# Patient Record
Sex: Male | Born: 1955
Health system: Southern US, Community
[De-identification: ages and names within clinical notes are randomized; demographics above are authoritative.]

## PROBLEM LIST (undated history)

## (undated) DIAGNOSIS — I10 Essential (primary) hypertension: Secondary | ICD-10-CM

## (undated) DIAGNOSIS — M1990 Unspecified osteoarthritis, unspecified site: Secondary | ICD-10-CM

## (undated) DIAGNOSIS — E785 Hyperlipidemia, unspecified: Secondary | ICD-10-CM

## (undated) DIAGNOSIS — R7303 Prediabetes: Secondary | ICD-10-CM

## (undated) DIAGNOSIS — R519 Headache, unspecified: Secondary | ICD-10-CM

## (undated) DIAGNOSIS — M4802 Spinal stenosis, cervical region: Secondary | ICD-10-CM

## (undated) DIAGNOSIS — K219 Gastro-esophageal reflux disease without esophagitis: Secondary | ICD-10-CM

## (undated) DIAGNOSIS — R319 Hematuria, unspecified: Secondary | ICD-10-CM

## (undated) DIAGNOSIS — J189 Pneumonia, unspecified organism: Secondary | ICD-10-CM

## (undated) HISTORY — DX: Spinal stenosis, cervical region: M48.02

## (undated) HISTORY — DX: Hyperlipidemia, unspecified: E78.5

## (undated) HISTORY — PX: OTHER SURGICAL HISTORY: SHX169

## (undated) HISTORY — DX: Hematuria, unspecified: R31.9

## (undated) HISTORY — PX: HERNIA REPAIR: SHX51

## (undated) HISTORY — DX: Headache, unspecified: R51.9

---

## 2006-01-19 ENCOUNTER — Emergency Department (HOSPITAL_COMMUNITY): Admission: EM | Admit: 2006-01-19 | Discharge: 2006-01-19 | Payer: Self-pay | Admitting: Emergency Medicine

## 2009-01-05 ENCOUNTER — Emergency Department (HOSPITAL_COMMUNITY): Admission: EM | Admit: 2009-01-05 | Discharge: 2009-01-05 | Payer: Self-pay | Admitting: Emergency Medicine

## 2009-01-05 IMAGING — CR DG CHEST 2V
3 series · 3 of 3 positions shown · non-contrast
Comparison: None

CLINICAL DATA: Cough, shortness of breath, smoker, asthma

CHEST - 2 VIEW

[view not recorded (1 of 3)]
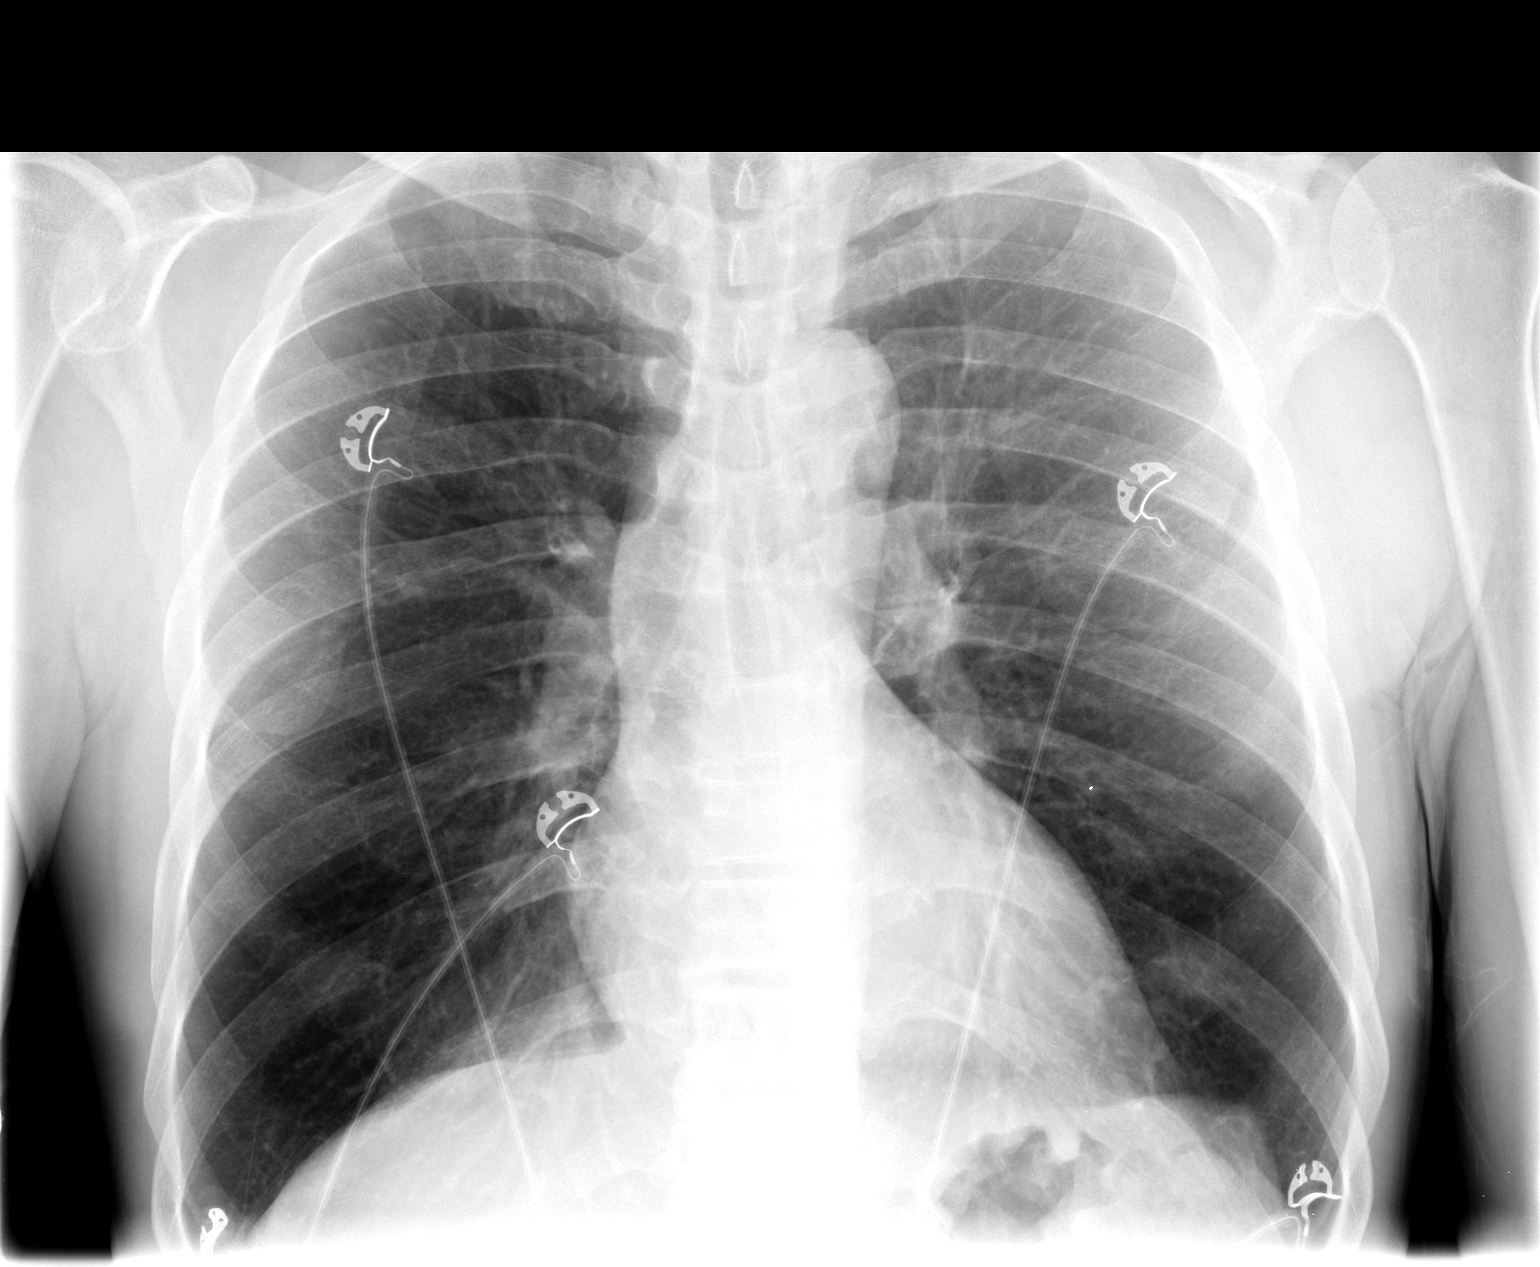

[view not recorded (2 of 3)]
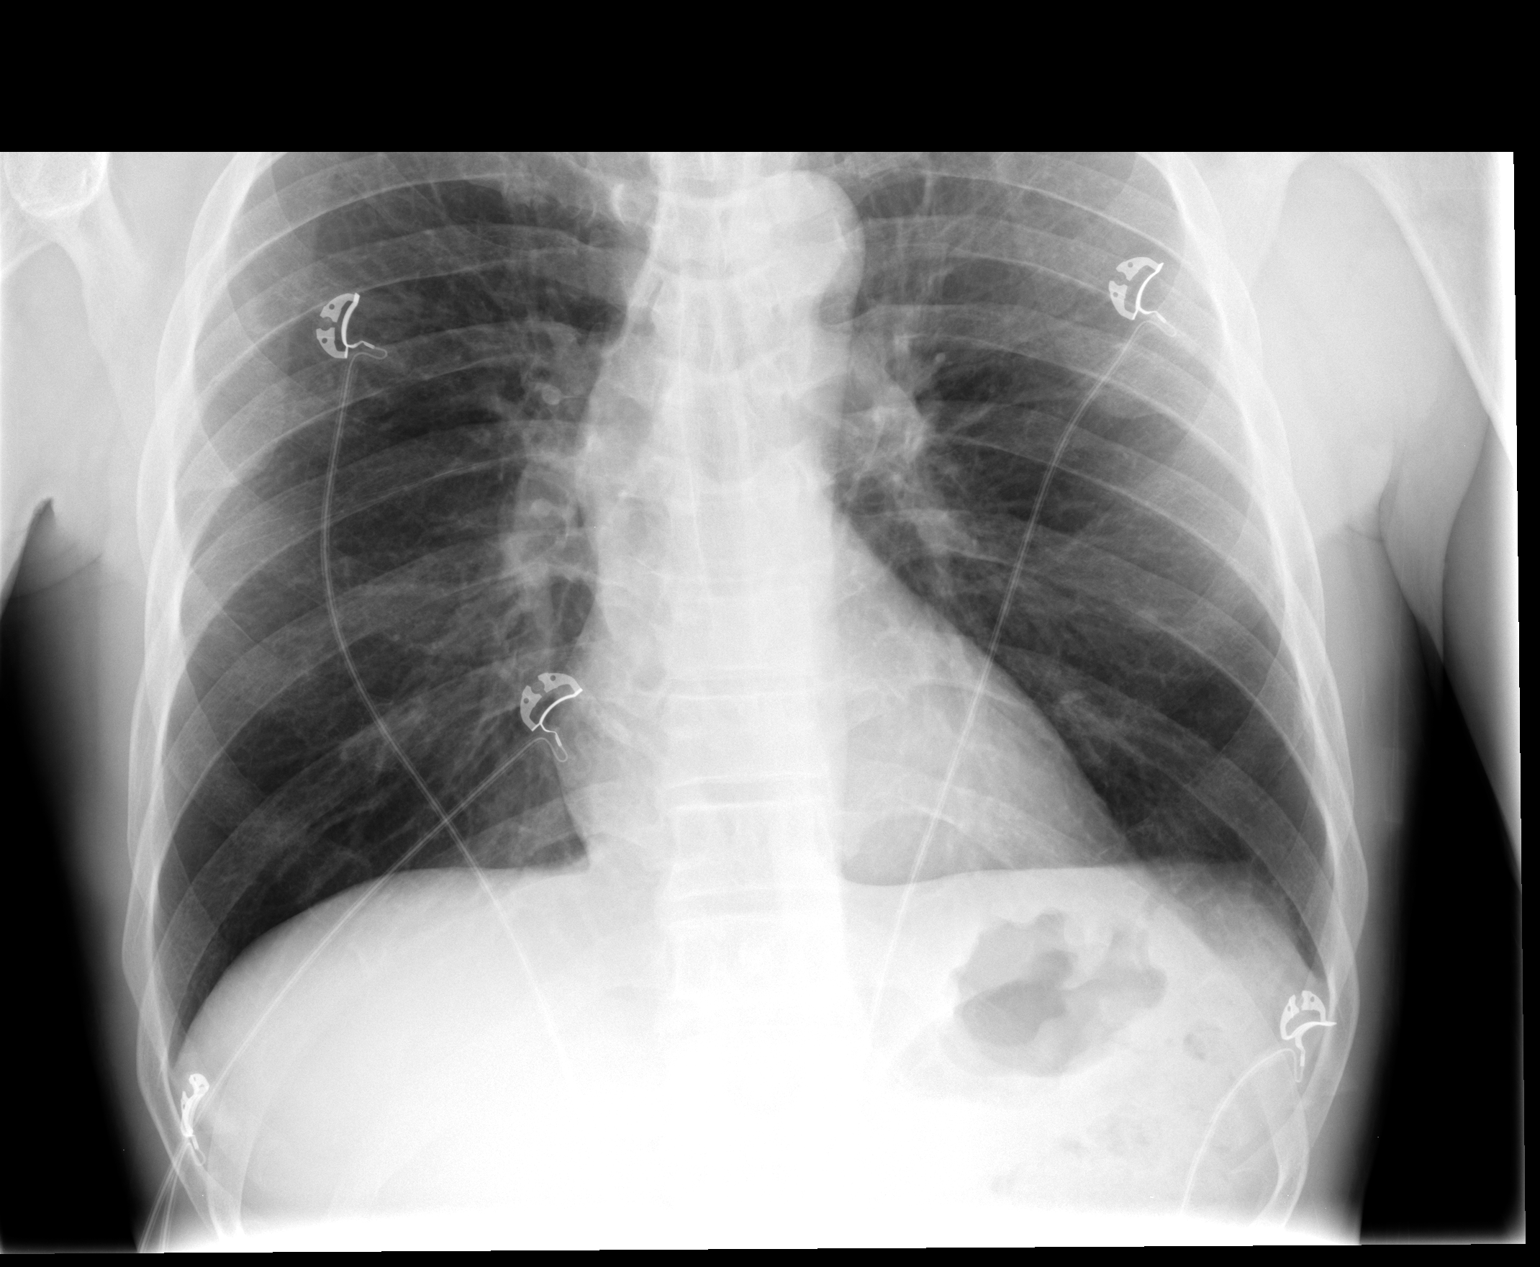

[view not recorded (3 of 3)]
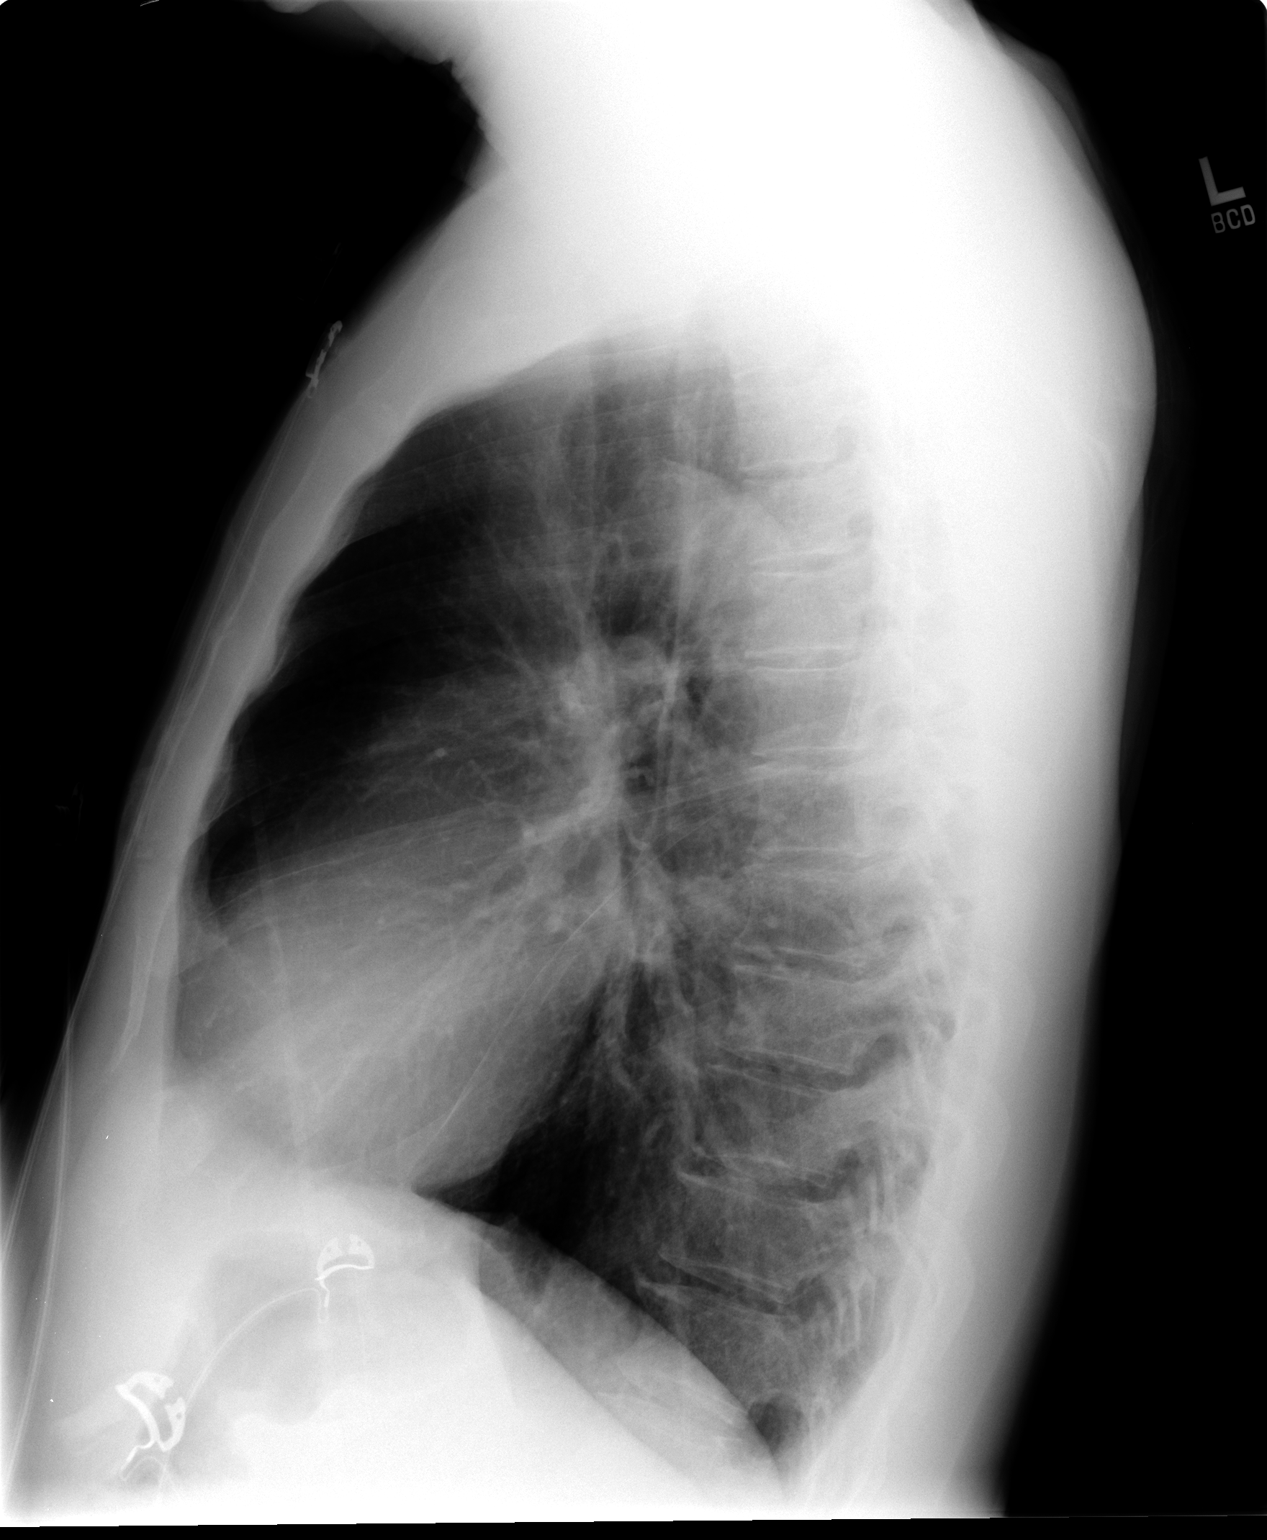

[3 of 3 positions shown; findings below may reference images not displayed]

FINDINGS: Normal heart size, mediastinal contours, and pulmonary vascularity.
Peribronchial thickening and minimal hyperexpansion question asthma
or COPD.
No pulmonary infiltrate, pleural effusion, or pneumothorax.
Cardiac monitoring lines checked over chest.
IMPRESSION: COPD versus asthma.
No acute abnormalities.

## 2010-09-04 LAB — BASIC METABOLIC PANEL
BUN: 15 mg/dL (ref 6–23)
CO2: 29 mEq/L (ref 19–32)
Calcium: 9.3 mg/dL (ref 8.4–10.5)
Chloride: 103 mEq/L (ref 96–112)
Creatinine, Ser: 1.19 mg/dL (ref 0.4–1.5)
GFR calc Af Amer: >60 mL/min (ref >60)
GFR calc non Af Amer: >60 mL/min (ref >60)
Glucose, Bld: 109 mg/dL — ABNORMAL HIGH (ref 70–99)
Potassium: 3.6 mEq/L (ref 3.5–5.1)
Sodium: 138 mEq/L (ref 135–145)

## 2010-09-04 LAB — DIFFERENTIAL
Basophils Absolute: 0 10*3/uL (ref 0.0–0.1)
Basophils Relative: 0 % (ref 0–1)
Eosinophils Absolute: 0.2 10*3/uL (ref 0.0–0.7)
Eosinophils Relative: 2 % (ref 0–5)
Lymphocytes Relative: 18 % (ref 12–46)
Lymphs Abs: 1.8 10*3/uL (ref 0.7–4.0)
Monocytes Absolute: 0.1 10*3/uL (ref 0.1–1.0)
Monocytes Relative: 1 % — ABNORMAL LOW (ref 3–12)
Neutro Abs: 7.9 10*3/uL — ABNORMAL HIGH (ref 1.7–7.7)
Neutrophils Relative %: 79 % — ABNORMAL HIGH (ref 43–77)

## 2010-09-04 LAB — CBC
HCT: 42.4 % (ref 39.0–52.0)
Hemoglobin: 14.4 g/dL (ref 13.0–17.0)
MCHC: 33.8 g/dL (ref 30.0–36.0)
MCV: 85.5 fL (ref 78.0–100.0)
Platelets: 186 10*3/uL (ref 150–400)
RBC: 4.96 MIL/uL (ref 4.22–5.81)
RDW: 13.9 % (ref 11.5–15.5)
WBC: 10 10*3/uL (ref 4.0–10.5)

## 2010-12-23 ENCOUNTER — Encounter: Payer: Self-pay | Admitting: *Deleted

## 2010-12-23 ENCOUNTER — Emergency Department (HOSPITAL_COMMUNITY)
Admission: EM | Admit: 2010-12-23 | Discharge: 2010-12-23 | Disposition: A | Discharge status: Home / Self Care | Payer: Self-pay | Attending: Emergency Medicine | Admitting: Emergency Medicine

## 2010-12-23 DIAGNOSIS — J45909 Unspecified asthma, uncomplicated: Secondary | ICD-10-CM | POA: Insufficient documentation

## 2010-12-23 MED ORDER — ALBUTEROL SULFATE HFA 108 (90 BASE) MCG/ACT IN AERS: 2.0000 | INHALATION_SPRAY | RESPIRATORY_TRACT | Status: DC | PRN

## 2010-12-23 NOTE — ED Provider Notes (Signed)
Medical screening examination/treatment/procedure(s) were performed by non-physician practitioner and as supervising physician I was immediately available for consultation/collaboration.    Joya Gaskins, MD 12/23/10 [phone]

## 2010-12-23 NOTE — ED Provider Notes (Signed)
History     Chief Complaint  Patient presents with  . Asthma   HPI Comments: Pt states he ran out of his inhaler about a week ago. He notices difficulty with breathing in the heat when he is out of the inhaler. He denies chest pain, hemoptysis, or LOC. Problem resolves with the fan blowing in his face or using his inhaler.  Patient is a 55 y.o. male presenting with asthma. The history is provided by the patient.  Asthma This is a recurrent problem. The current episode started in the past 7 days. The problem occurs daily. The problem has been unchanged. Associated symptoms include coughing. Pertinent negatives include no abdominal pain, arthralgias, chest pain, chills, fever, nausea, neck pain or rash. Exacerbated by: heat. He has tried nothing for the symptoms. The treatment provided no relief.    Past Medical History  Diagnosis Date  . Asthma     Past Surgical History  Procedure Date  . Hernia repair     No family history on file.  History  Substance Use Topics  . Smoking status: Never Smoker   . Smokeless tobacco: Not on file  . Alcohol Use: Yes     Occ      Review of Systems  Constitutional: Negative for fever, chills and activity change.       All ROS Neg except as noted in HPI  HENT: Negative for nosebleeds and neck pain.   Eyes: Negative for photophobia and discharge.  Respiratory: Positive for cough, chest tightness, shortness of breath and wheezing. Negative for apnea.   Cardiovascular: Negative for chest pain and palpitations.  Gastrointestinal: Negative for nausea, abdominal pain and blood in stool.  Genitourinary: Negative for dysuria, frequency and hematuria.  Musculoskeletal: Negative for back pain and arthralgias.  Skin: Negative.  Negative for rash.  Neurological: Negative for dizziness, seizures and speech difficulty.  Psychiatric/Behavioral: Negative for hallucinations and confusion.    Physical Exam  BP 139/78  Pulse 71  Temp(Src) 98.5 F (36.9  C) (Oral)  Resp 20  Ht 5\' 9"  (1.753 m)  Wt 180 lb (81.647 kg)  BMI 26.58 kg/m2  SpO2 95%  Physical Exam  Nursing note and vitals reviewed. Constitutional: He is oriented to person, place, and time. He appears well-developed and well-nourished.  Non-toxic appearance.  HENT:  Head: Normocephalic.  Right Ear: Tympanic membrane and external ear normal.  Left Ear: Tympanic membrane and external ear normal.  Eyes: EOM and lids are normal. Pupils are equal, round, and reactive to light.  Neck: Normal range of motion. Neck supple. Carotid bruit is not present.  Cardiovascular: Normal rate, regular rhythm, normal heart sounds, intact distal pulses and normal pulses.   Pulmonary/Chest: Breath sounds normal. No respiratory distress. He has no wheezes. He has no rales. He exhibits no tenderness.       Course breath sounds, no distress. No wheeze, no Retractions.  Abdominal: Soft. Bowel sounds are normal. There is no tenderness. There is no guarding.  Musculoskeletal: Normal range of motion.  Lymphadenopathy:       Head (right side): No submandibular adenopathy present.       Head (left side): No submandibular adenopathy present.    He has no cervical adenopathy.  Neurological: He is alert and oriented to person, place, and time. He has normal strength. No cranial nerve deficit or sensory deficit.  Skin: Skin is warm and dry. He is not diaphoretic. No pallor.  Psychiatric: He has a normal mood and affect. His  speech is normal.    ED Course  Procedures  MDM I have reviewed nursing notes, vital signs, and all appropriate lab and imaging results for this patient.      Kathie Dike, [place] 12/23/10 1021

## 2010-12-23 NOTE — ED Notes (Signed)
Pt states asthma is acting up. Out of inhaler. Cough, productive at times, white in color. No distress or audible wheezing at triage.

## 2010-12-23 NOTE — ED Notes (Signed)
Pt eval by [facility].  

## 2014-01-19 ENCOUNTER — Encounter (HOSPITAL_COMMUNITY): Payer: Self-pay | Admitting: Emergency Medicine

## 2014-01-19 ENCOUNTER — Emergency Department (HOSPITAL_COMMUNITY)
Admission: EM | Admit: 2014-01-19 | Discharge: 2014-01-19 | Disposition: A | Discharge status: Home / Self Care | Payer: Self-pay | Attending: Emergency Medicine | Admitting: Emergency Medicine

## 2014-01-19 ENCOUNTER — Emergency Department (HOSPITAL_COMMUNITY): Payer: No Typology Code available for payment source

## 2014-01-19 DIAGNOSIS — S46909A Unspecified injury of unspecified muscle, fascia and tendon at shoulder and upper arm level, unspecified arm, initial encounter: Secondary | ICD-10-CM | POA: Insufficient documentation

## 2014-01-19 DIAGNOSIS — IMO0002 Reserved for concepts with insufficient information to code with codable children: Secondary | ICD-10-CM | POA: Insufficient documentation

## 2014-01-19 DIAGNOSIS — Z23 Encounter for immunization: Secondary | ICD-10-CM | POA: Insufficient documentation

## 2014-01-19 DIAGNOSIS — J45909 Unspecified asthma, uncomplicated: Secondary | ICD-10-CM | POA: Insufficient documentation

## 2014-01-19 DIAGNOSIS — Y92410 Unspecified street and highway as the place of occurrence of the external cause: Secondary | ICD-10-CM | POA: Insufficient documentation

## 2014-01-19 DIAGNOSIS — Y9389 Activity, other specified: Secondary | ICD-10-CM | POA: Insufficient documentation

## 2014-01-19 DIAGNOSIS — S4980XA Other specified injuries of shoulder and upper arm, unspecified arm, initial encounter: Secondary | ICD-10-CM | POA: Insufficient documentation

## 2014-01-19 DIAGNOSIS — T148XXA Other injury of unspecified body region, initial encounter: Secondary | ICD-10-CM
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
## 2014-01-19 MED ORDER — TETANUS-DIPHTH-ACELL PERTUSSIS 5-2.5-18.5 LF-MCG/0.5 IM SUSP
0.5000 mL | Freq: Once | INTRAMUSCULAR | Status: AC
  Administered 2014-01-19: 0.5 mL via INTRAMUSCULAR
  Filled 2014-01-19: qty 0.5

## 2014-01-19 MED ORDER — HYDROCODONE-ACETAMINOPHEN 5-325 MG PO TABS
2.0000 | ORAL_TABLET | Freq: Once | ORAL | Status: AC
  Administered 2014-01-19: 2 via ORAL
  Filled 2014-01-19: qty 2

## 2014-01-19 MED ORDER — HYDROCODONE-ACETAMINOPHEN 5-325 MG PO TABS: 1.0000 | ORAL_TABLET | Freq: Four times a day (QID) | ORAL | Status: DC | PRN
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
## 2014-01-19 NOTE — Discharge Instructions (Signed)
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
## 2014-01-22 ENCOUNTER — Emergency Department (HOSPITAL_COMMUNITY)
Admission: EM | Admit: 2014-01-22 | Discharge: 2014-01-22 | Disposition: A | Discharge status: Home / Self Care | Payer: No Typology Code available for payment source | Attending: Emergency Medicine | Admitting: Emergency Medicine

## 2014-01-22 ENCOUNTER — Emergency Department (HOSPITAL_COMMUNITY): Payer: No Typology Code available for payment source

## 2014-01-22 ENCOUNTER — Encounter (HOSPITAL_COMMUNITY): Payer: Self-pay | Admitting: Emergency Medicine

## 2014-01-22 DIAGNOSIS — M549 Dorsalgia, unspecified: Secondary | ICD-10-CM | POA: Insufficient documentation

## 2014-01-22 DIAGNOSIS — M25559 Pain in unspecified hip: Secondary | ICD-10-CM | POA: Insufficient documentation

## 2014-01-22 DIAGNOSIS — Z791 Long term (current) use of non-steroidal anti-inflammatories (NSAID): Secondary | ICD-10-CM | POA: Insufficient documentation

## 2014-01-22 DIAGNOSIS — Y92410 Unspecified street and highway as the place of occurrence of the external cause: Secondary | ICD-10-CM | POA: Insufficient documentation

## 2014-01-22 DIAGNOSIS — S7011XA Contusion of right thigh, initial encounter: Secondary | ICD-10-CM

## 2014-01-22 DIAGNOSIS — Z4800 Encounter for change or removal of nonsurgical wound dressing: Secondary | ICD-10-CM | POA: Insufficient documentation

## 2014-01-22 DIAGNOSIS — Y9389 Activity, other specified: Secondary | ICD-10-CM | POA: Insufficient documentation

## 2014-01-22 DIAGNOSIS — J45909 Unspecified asthma, uncomplicated: Secondary | ICD-10-CM | POA: Insufficient documentation

## 2014-01-22 DIAGNOSIS — S7010XA Contusion of unspecified thigh, initial encounter: Secondary | ICD-10-CM | POA: Insufficient documentation

## 2014-01-22 DIAGNOSIS — Z5189 Encounter for other specified aftercare: Secondary | ICD-10-CM

## 2014-01-22 DIAGNOSIS — S7001XA Contusion of right hip, initial encounter: Secondary | ICD-10-CM

## 2014-01-22 IMAGING — CR DG HIP COMPLETE 2+V*R*
3 series · 3 of 3 positions shown · non-contrast
Comparison: None.

CLINICAL DATA: Right hip pain status post trauma

EXAM:
RIGHT HIP - COMPLETE 2+ VIEW

[view not recorded (1 of 3)]
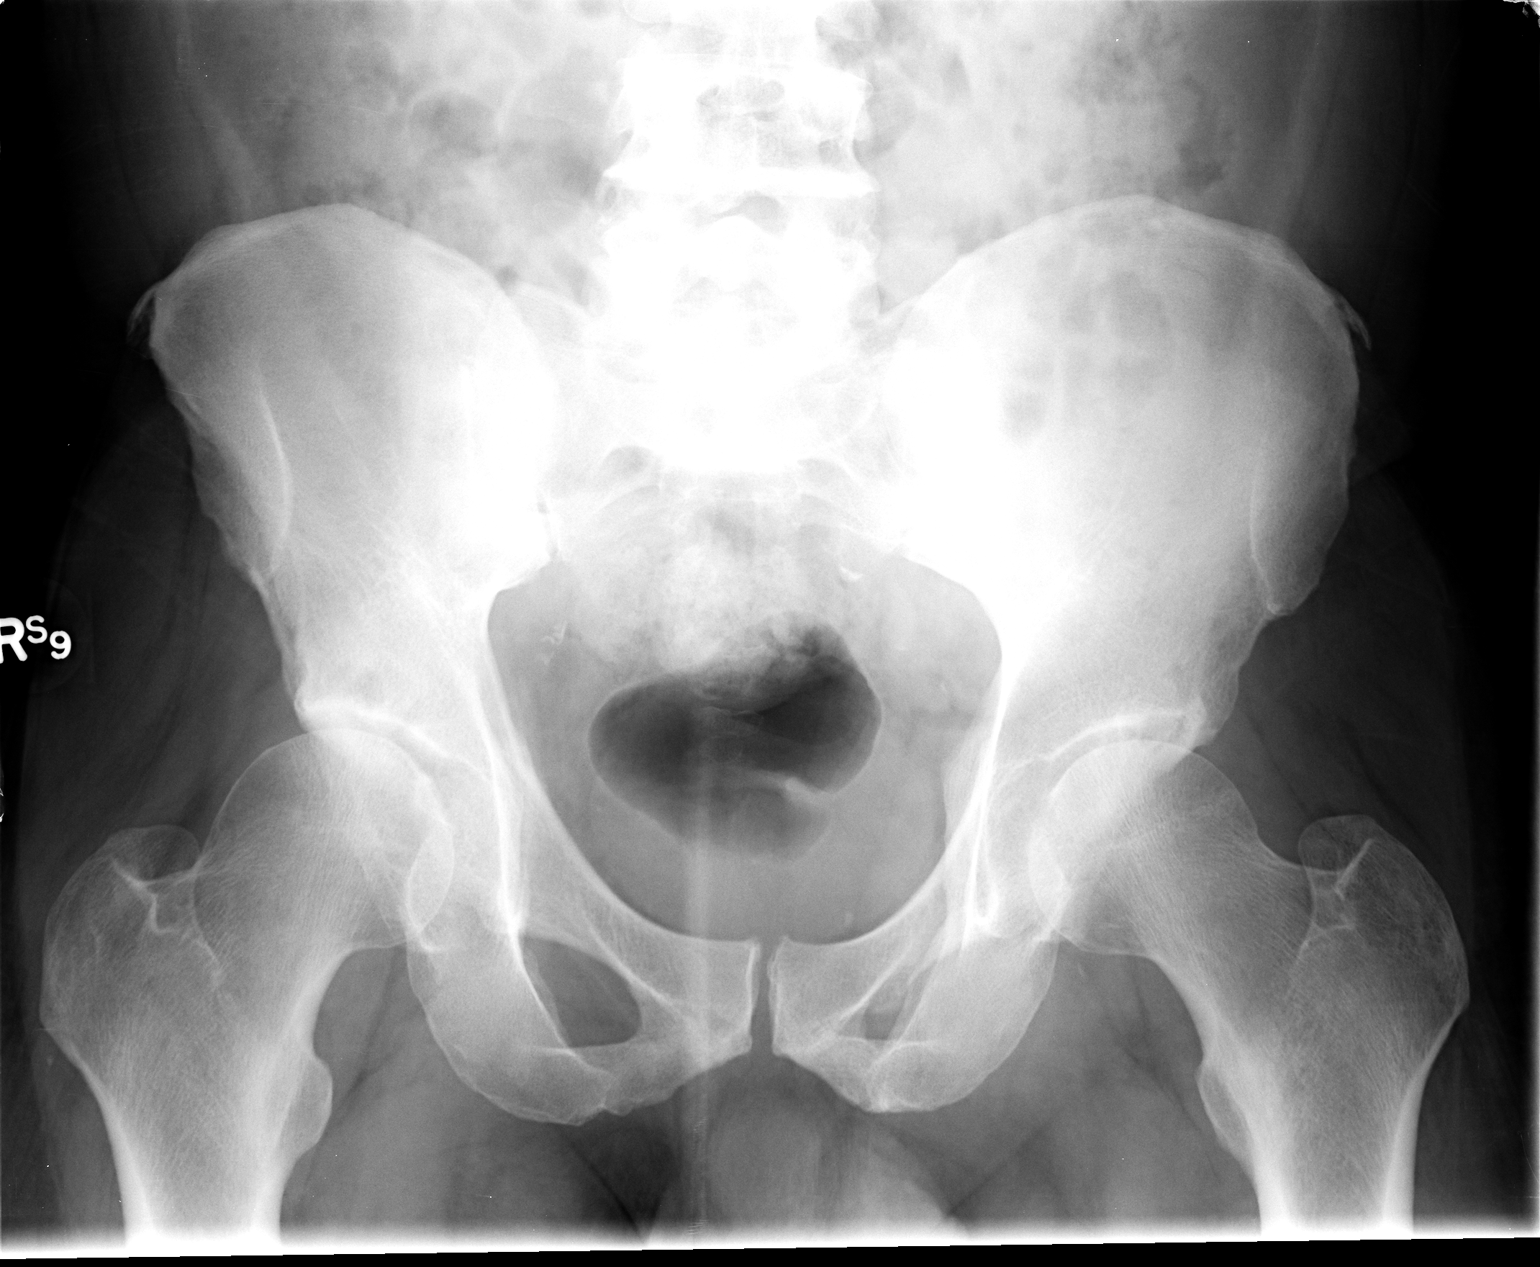

[view not recorded (2 of 3)]
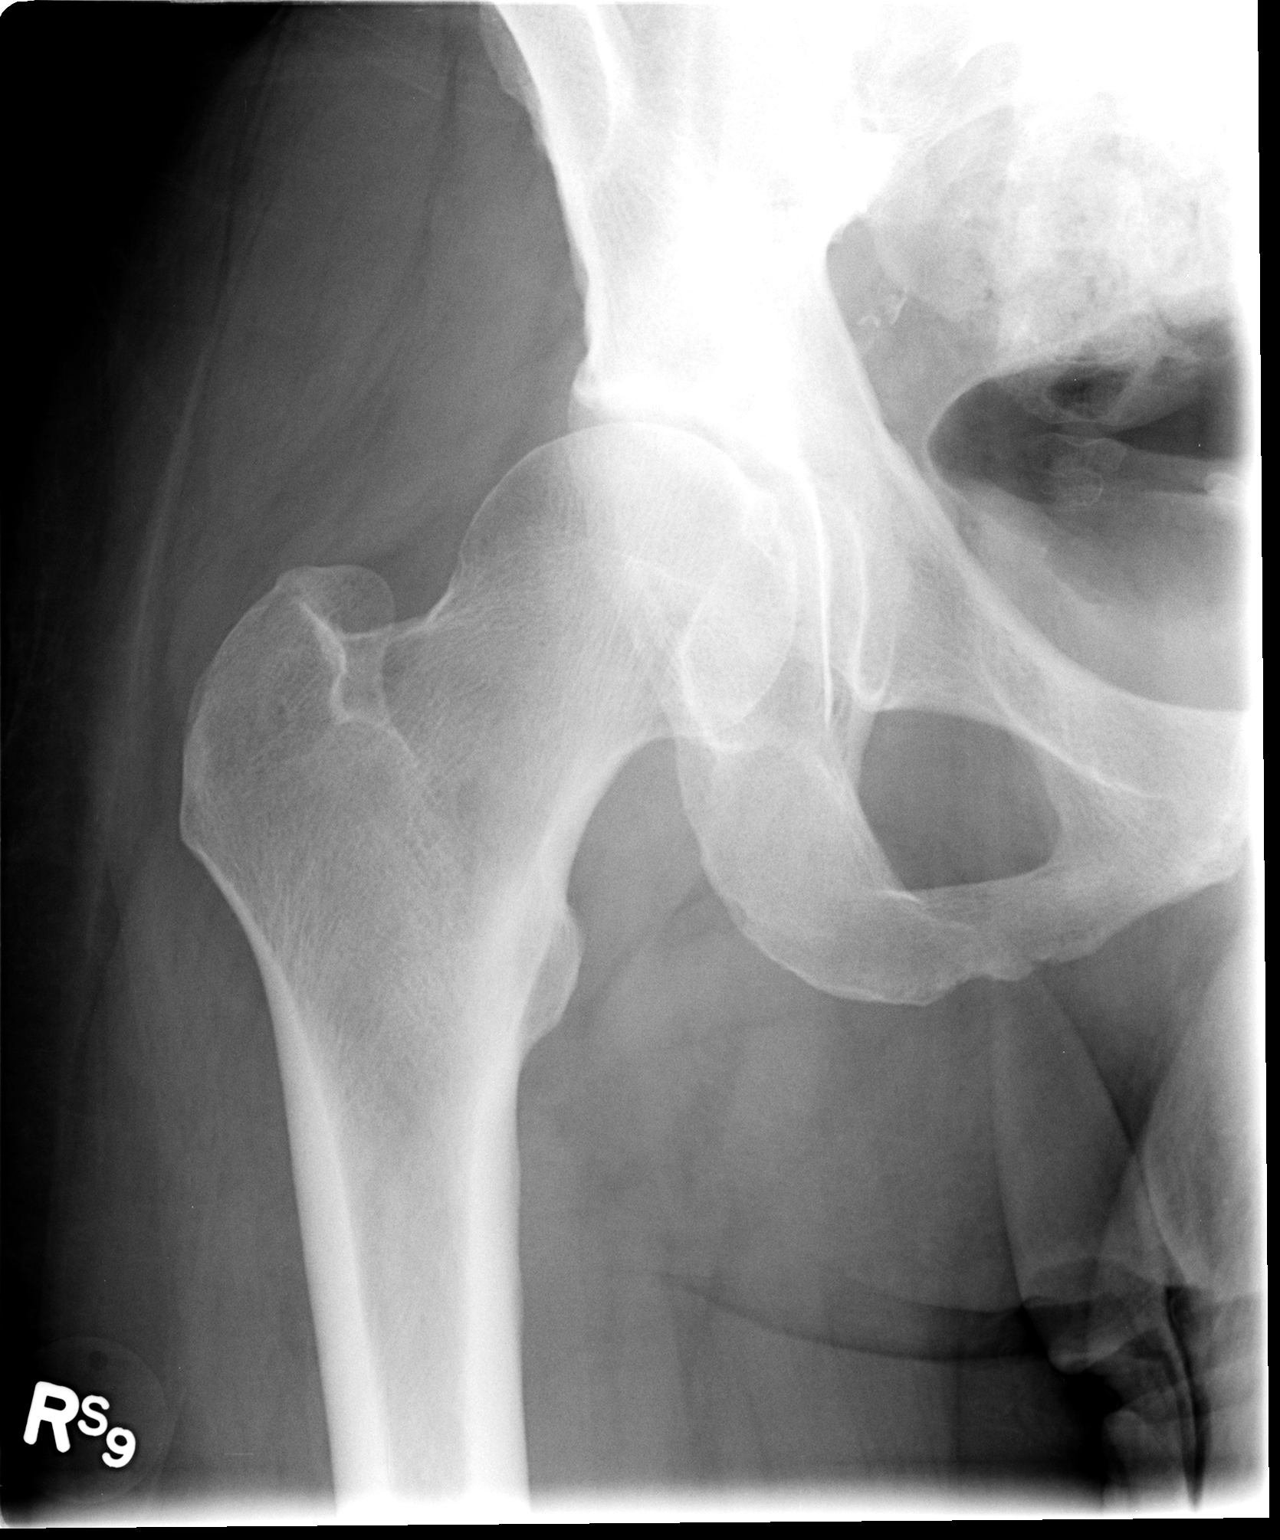

[view not recorded (3 of 3)]
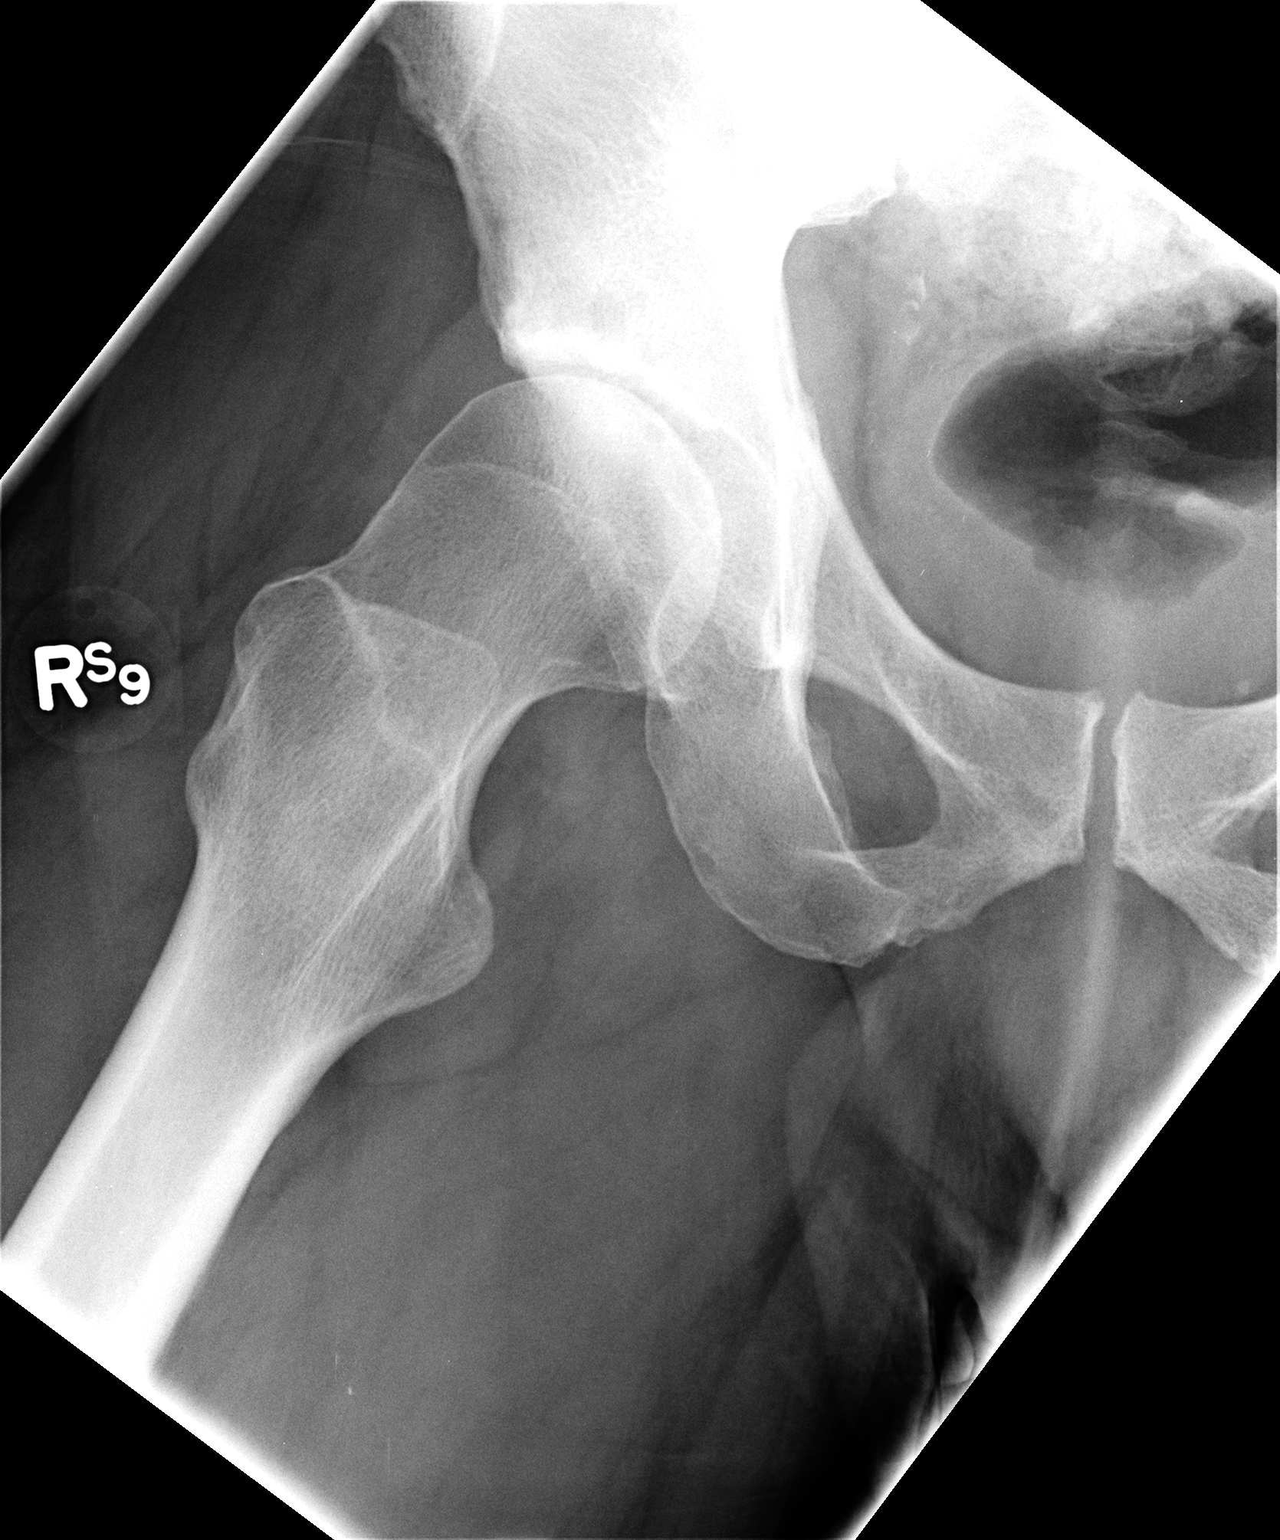

[3 of 3 positions shown; findings below may reference images not displayed]

FINDINGS: The bony pelvis is adequately mineralized. There is no acute
fracture nor dislocation. The sacrum and SI joints are unremarkable.
There are mild degenerative changes of the right hip joint. There is
no acute fracture nor dislocation.
IMPRESSION: There is mild degenerative change of the right hip joint. There is
no acute bony abnormality.

## 2014-01-22 MED ORDER — SILVER SULFADIAZINE 1 % EX CREA
TOPICAL_CREAM | Freq: Once | CUTANEOUS | Status: AC
  Administered 2014-01-22: 12:00:00 via TOPICAL
  Filled 2014-01-22: qty 50

## 2014-01-22 MED ORDER — NAPROXEN 500 MG PO TABS: 500.0000 mg | ORAL_TABLET | Freq: Two times a day (BID) | ORAL | Status: DC

## 2014-01-22 MED ORDER — OXYCODONE-ACETAMINOPHEN 5-325 MG PO TABS: 1.0000 | ORAL_TABLET | ORAL | Status: DC | PRN
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
## 2014-01-22 NOTE — Discharge Instructions (Signed)
Contusion A contusion is a deep bruise. Contusions are the result of an injury that caused bleeding under the skin. The contusion may turn blue, purple, or yellow. Minor injuries will give you a painless contusion, but more severe contusions may stay painful and swollen for a few weeks.  CAUSES  A contusion is usually caused by a blow, trauma, or direct force to an area of the body. SYMPTOMS   Swelling and redness of the injured area.  Bruising of the injured area.  Tenderness and soreness of the injured area.  Pain. DIAGNOSIS  The diagnosis can be made by taking a history and physical exam. An X-Kay[place], CT scan, or MRI may be needed to determine if there were any associated injuries, such as fractures. TREATMENT  Specific treatment will depend on what area of the body was injured. In general, the best treatment for a contusion is resting, icing, elevating, and applying cold compresses to the injured area. Over-the-counter medicines may also be recommended for pain control. Ask your caregiver what the best treatment is for your contusion. HOME CARE INSTRUCTIONS   Put ice on the injured area.  Put ice in a plastic bag.  Place a towel between your skin and the bag.  Leave the ice on for 15-20 minutes, 3-4 times a day, or as directed by your health care provider.  Only take over-the-counter or prescription medicines for pain, discomfort, or fever as directed by your caregiver. Your caregiver may recommend avoiding anti-inflammatory medicines (aspirin, ibuprofen, and naproxen) for 48 hours because these medicines may increase bruising.  Rest the injured area.  If possible, elevate the injured area to reduce swelling. SEEK IMMEDIATE MEDICAL CARE IF:   You have increased bruising or swelling.  You have pain that is getting worse.  Your swelling or pain is not relieved with medicines. MAKE SURE YOU:   Understand these instructions.  Will watch your condition.  Will get help right  away if you are not doing well or get worse. Document Released: 02/22/2005 Document Revised: 05/20/2013 Document Reviewed: 03/20/2011 [facility] Patient Information 2015 [place], [place]. This information is not intended to replace advice given to you by your health care provider. Make sure you discuss any questions you have with your health care provider.   As discussed you may take Percocet in place of the Vicodin which may give better pain relief.  Do not drive within 4 to taking this medication as it will make you drowsy.  Also add naproxen prescribed which can give additional pain relief.  Apply heating pad to your areas of pain for 20 minutes several times daily.  This can help heal your injuries quicker.  Your x-rays are negative for any hip bone injury.  Apply the Silvadene cream given to your left elbow abrasion twice day after gently washing this area with  mild soap and water. keep your wound clean and dry.  Return here for any worsened symptoms.

## 2014-01-22 NOTE — Care Management Note (Signed)
ED/CM noted patient did not have health insurance and/or PCP listed in the computer.  Patient was given the [place] resource handout with information on the clinics, food pantries, and the handout for new health insurance sign-up.  Patient expressed appreciation for information received. Pt was also given a Rx discount card.   

## 2014-01-27 NOTE — ED Provider Notes (Signed)
Medical screening examination/treatment/procedure(s) were performed by non-physician practitioner and as supervising physician I was immediately available for consultation/collaboration.   EKG Interpretation None      Cherysh Epperly, MD, FACEP   Lavergne Hiltunen L Deondrea Markos, MD 01/27/14 1507 

## 2014-04-24 IMAGING — US US ART/VEN ABD/PELV/SCROTUM DOPPLER LTD
1 series · 13 of 25 positions shown · non-contrast
Comparison: CT of the abdomen pelvis dated [DATE]

CLINICAL DATA: 59-year-old male with right testicular pain x3
weeks.

EXAM:
SCROTAL ULTRASOUND
DOPPLER ULTRASOUND OF THE TESTICLES
TECHNIQUE: Complete ultrasound examination of the testicles, epididymis, and
other scrotal structures was performed. Color and spectral Doppler
ultrasound were also utilized to evaluate blood flow to the
testicles.

[Series 1: us art/ven abd/pelv/scrotum doppler ltd · 0.06mm/px · 13 of 74 slices shown]
[im 1/74]
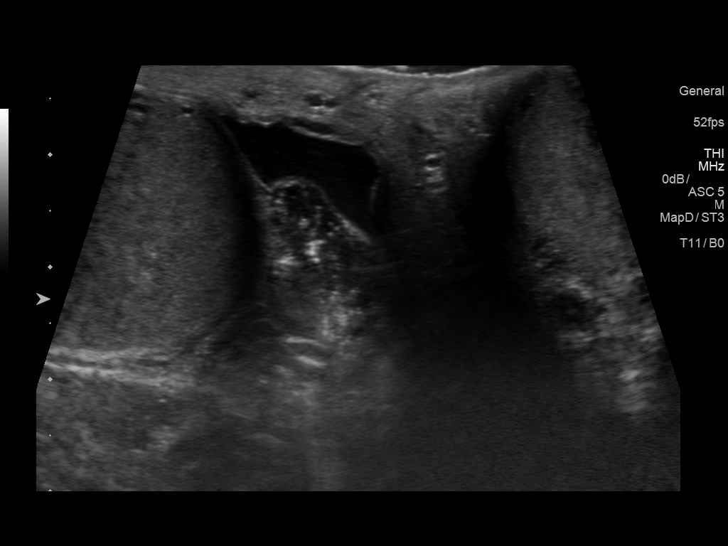
[im 7/74]
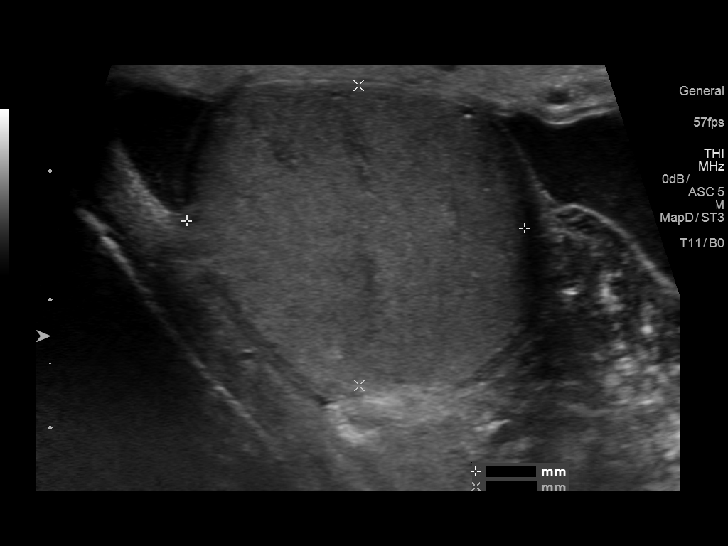
[im 13/74]
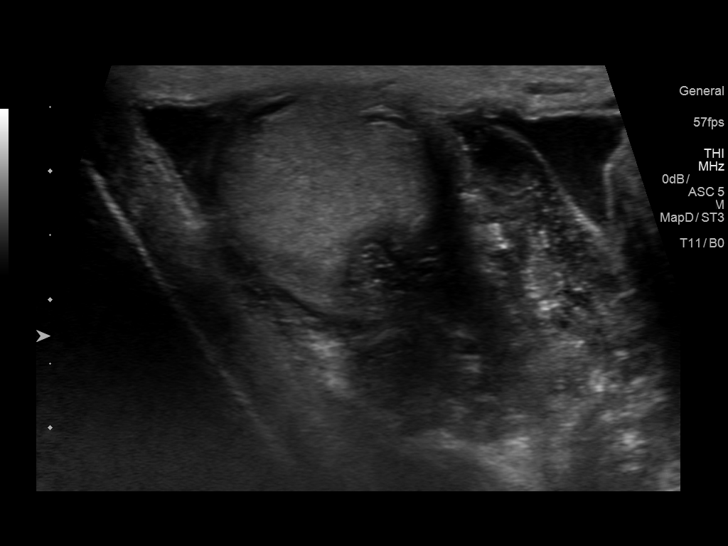
[im 19/74]
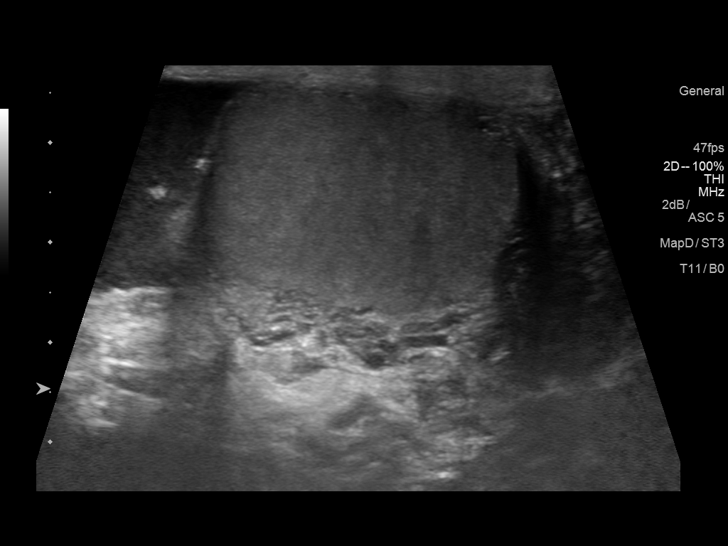
[im 25/74]
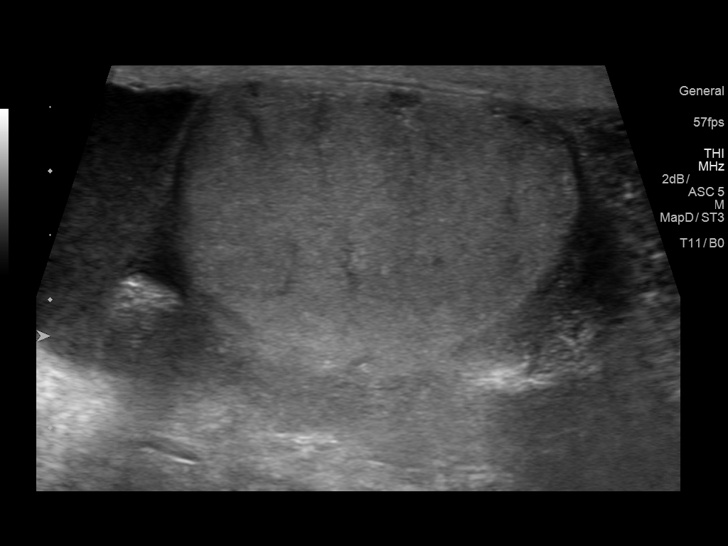
[im 31/74]
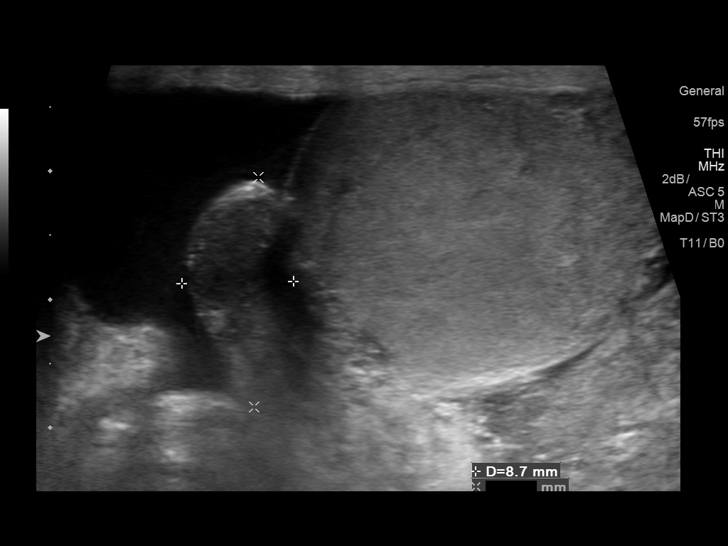
[im 37/74]
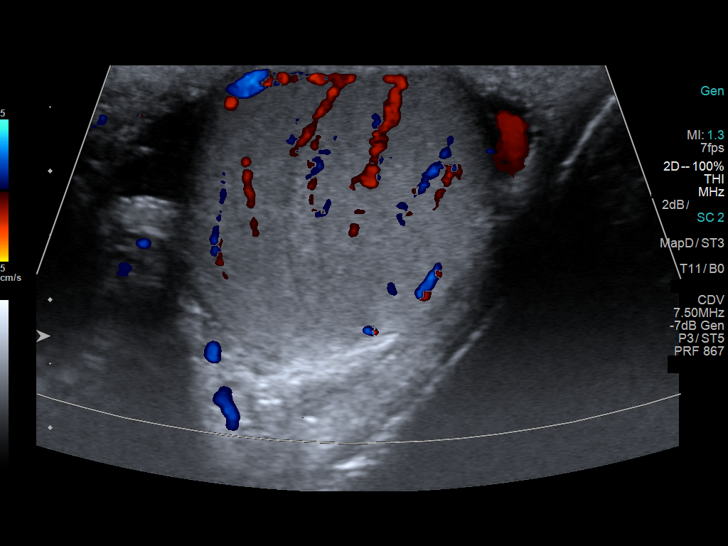
[im 43/74]
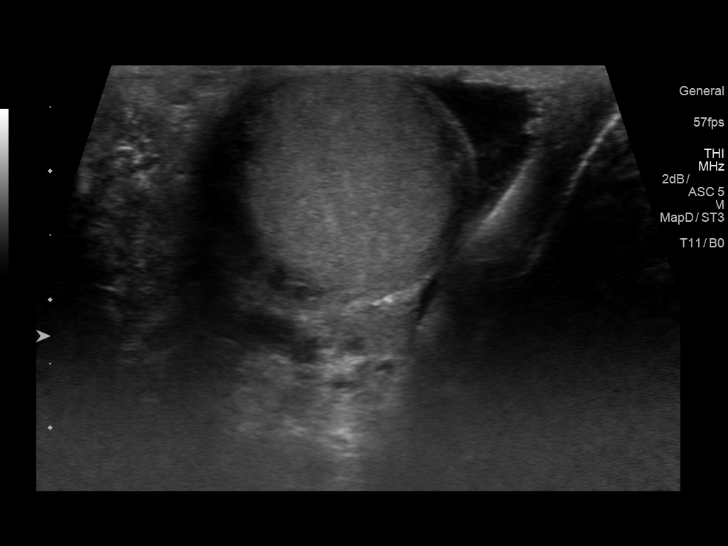
[im 49/74]
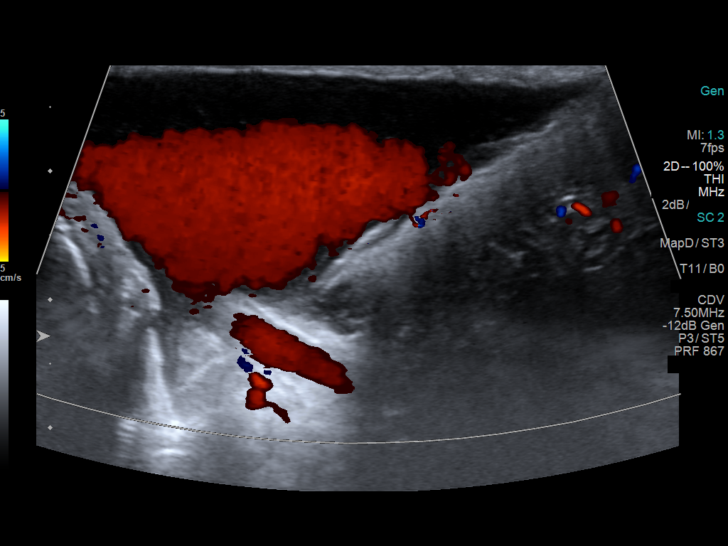
[im 55/74]
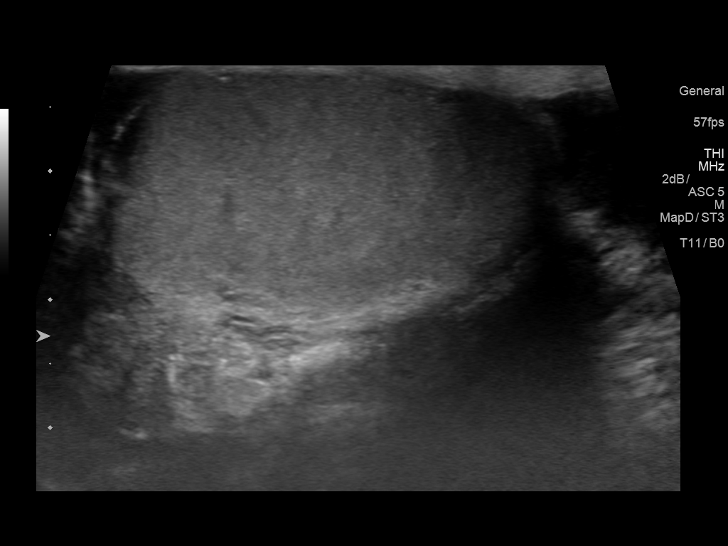
[im 61/74]
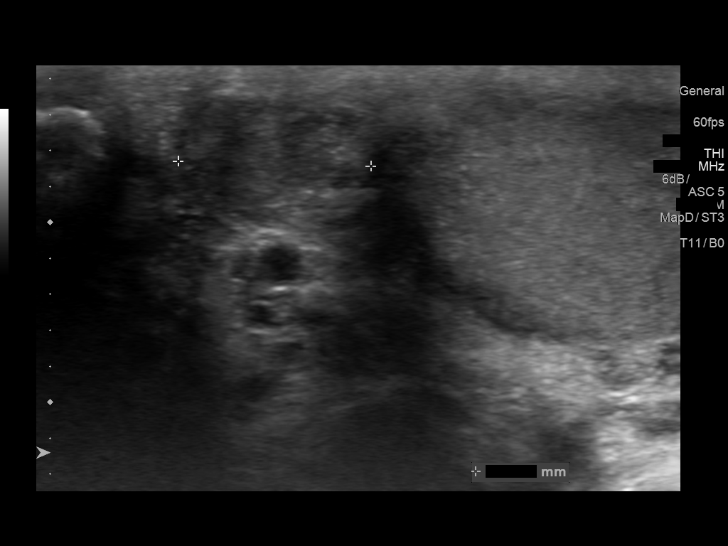
[im 67/74]
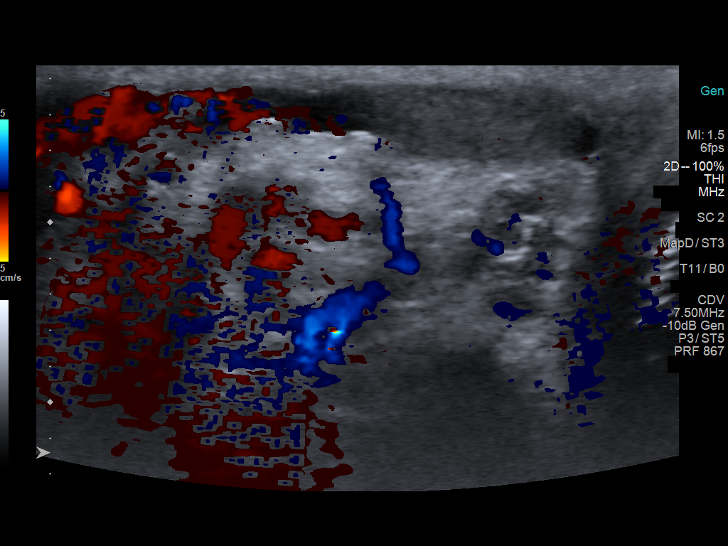
[im 74/74]
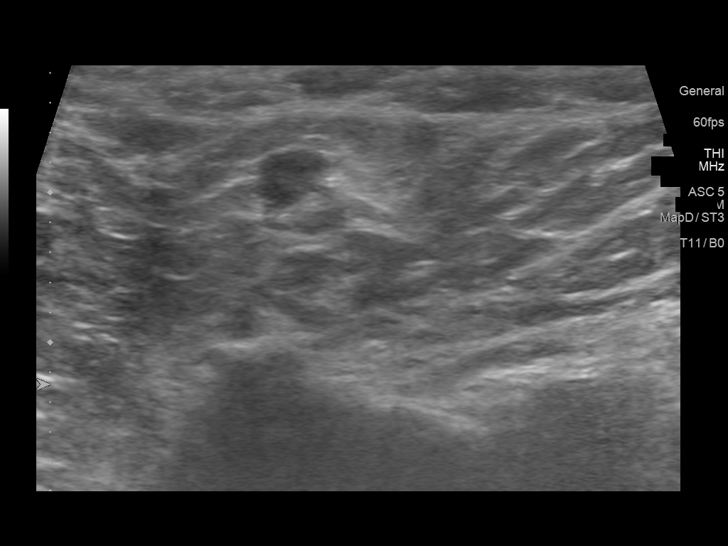

[13 of 25 positions shown; findings below may reference images not displayed]

FINDINGS: Right testicle

Measurements: 3.4 x 2.3 x 2.6 cm. There is mild increased testicular
vascularity. No mass or microlithiasis visualized.

Left testicle

Measurements: 3.7 x 2.4 x 2.4 cm. There is mild increased testicular
vascularity. No mass or microlithiasis visualized.

Right epididymis: Slightly heterogeneous measuring 1.0 x 1.8 x
cm

Left epididymis: Slightly heterogeneous measuring 1.1 x 0.9 x 0.9 cm

Hydrocele: There are bilateral hydroceles containing low-level
echogenic debris .

Varicocele:  There is mildly dilated venous plexus on the left side.

Pulsed Doppler interrogation of both testes demonstrates normal low
resistance arterial and venous waveforms bilaterally.
IMPRESSION: Mild bilateral testicular heterogeneity and increased vascularity.
Correlation with clinical exam and urinalysis recommended to
evaluate for an infectious process.

Bilateral hydroceles containing low-level echogenic debris.

Doppler detected flow within the testicles.

## 2015-06-11 ENCOUNTER — Encounter (HOSPITAL_COMMUNITY): Payer: Self-pay

## 2015-06-11 ENCOUNTER — Emergency Department (HOSPITAL_COMMUNITY): Payer: Self-pay

## 2015-06-11 ENCOUNTER — Emergency Department (HOSPITAL_COMMUNITY)
Admission: EM | Admit: 2015-06-11 | Discharge: 2015-06-11 | Disposition: A | Discharge status: Home / Self Care | Payer: Self-pay | Attending: Emergency Medicine | Admitting: Emergency Medicine

## 2015-06-11 DIAGNOSIS — N453 Epididymo-orchitis: Secondary | ICD-10-CM | POA: Insufficient documentation

## 2015-06-11 DIAGNOSIS — N50819 Testicular pain, unspecified: Secondary | ICD-10-CM

## 2015-06-11 DIAGNOSIS — Z791 Long term (current) use of non-steroidal anti-inflammatories (NSAID): Secondary | ICD-10-CM | POA: Insufficient documentation

## 2015-06-11 DIAGNOSIS — R109 Unspecified abdominal pain: Secondary | ICD-10-CM

## 2015-06-11 DIAGNOSIS — R112 Nausea with vomiting, unspecified: Secondary | ICD-10-CM | POA: Insufficient documentation

## 2015-06-11 DIAGNOSIS — J45909 Unspecified asthma, uncomplicated: Secondary | ICD-10-CM | POA: Insufficient documentation

## 2015-06-11 LAB — URINALYSIS, ROUTINE W REFLEX MICROSCOPIC
Bilirubin Urine: NEGATIVE
Glucose, UA: NEGATIVE mg/dL
Ketones, ur: NEGATIVE mg/dL
Nitrite: NEGATIVE
Specific Gravity, Urine: 1.015 (ref 1.005–1.030)
pH: 6 (ref 5.0–8.0)

## 2015-06-11 LAB — COMPREHENSIVE METABOLIC PANEL
ALT: 24 U/L (ref 17–63)
AST: 27 U/L (ref 15–41)
Albumin: 4 g/dL (ref 3.5–5.0)
Alkaline Phosphatase: 74 U/L (ref 38–126)
Anion gap: 9 (ref 5–15)
BUN: 19 mg/dL (ref 6–20)
CO2: 28 mmol/L (ref 22–32)
Calcium: 9.3 mg/dL (ref 8.9–10.3)
Chloride: 101 mmol/L (ref 101–111)
Creatinine, Ser: 1.02 mg/dL (ref 0.61–1.24)
GFR calc Af Amer: >60 mL/min (ref >60)
GFR calc non Af Amer: >60 mL/min (ref >60)
Glucose, Bld: 120 mg/dL — ABNORMAL HIGH (ref 65–99)
Potassium: 3.9 mmol/L (ref 3.5–5.1)
Sodium: 138 mmol/L (ref 135–145)
Total Bilirubin: 0.5 mg/dL (ref 0.3–1.2)
Total Protein: 8 g/dL (ref 6.5–8.1)

## 2015-06-11 LAB — URINE MICROSCOPIC-ADD ON

## 2015-06-11 LAB — LIPASE, BLOOD: Lipase: 68 U/L — ABNORMAL HIGH (ref 11–51)

## 2015-06-11 LAB — CBC
HCT: 38.2 % — ABNORMAL LOW (ref 39.0–52.0)
Hemoglobin: 12.3 g/dL — ABNORMAL LOW (ref 13.0–17.0)
MCH: 26.9 pg (ref 26.0–34.0)
MCHC: 32.2 g/dL (ref 30.0–36.0)
MCV: 83.6 fL (ref 78.0–100.0)
Platelets: 188 10*3/uL (ref 150–400)
RBC: 4.57 MIL/uL (ref 4.22–5.81)
RDW: 13.4 % (ref 11.5–15.5)
WBC: 10 10*3/uL (ref 4.0–10.5)

## 2015-06-11 LAB — I-STAT CG4 LACTIC ACID, ED: Lactic Acid, Venous: 1.46 mmol/L (ref 0.5–2.0)

## 2015-06-11 IMAGING — CT CT ABD-PELV W/ CM
2 of 5 series · 16 of 46 positions shown, 18 images · IV contrast (Omnipaque 300)
Comparison: None.

CLINICAL DATA: Lower abdominal and low back pain. Nausea, vomiting
and diaphoresis. Blood in stool.

EXAM:
CT ABDOMEN AND PELVIS WITH CONTRAST
TECHNIQUE: Multidetector CT imaging of the abdomen and pelvis was performed
using the standard protocol following bolus administration of
intravenous contrast.
CONTRAST:  100mL OMNIPAQUE IOHEXOL 300 MG/ML  SOLN

[Series 2: abd_pel_with 5.0 b40f · axial · 0.73mm/px · z∈[-495,-95]mm · 13 of 90 slices shown, 15 images]
[im 5/90  soft-tissue]
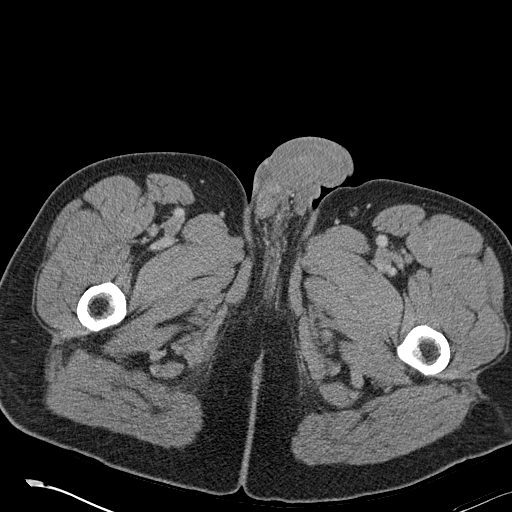
[im 5/90  bone]
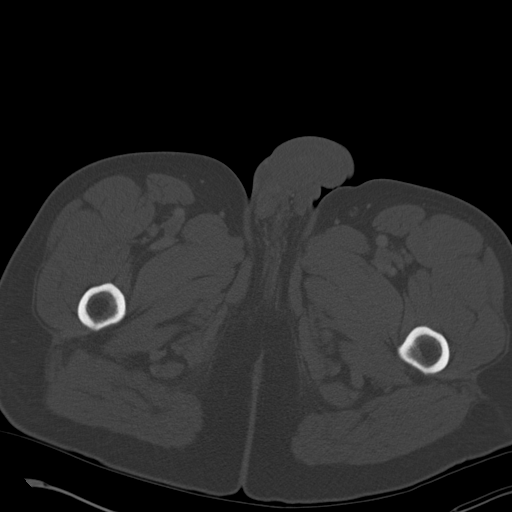
[im 10/90  soft-tissue]
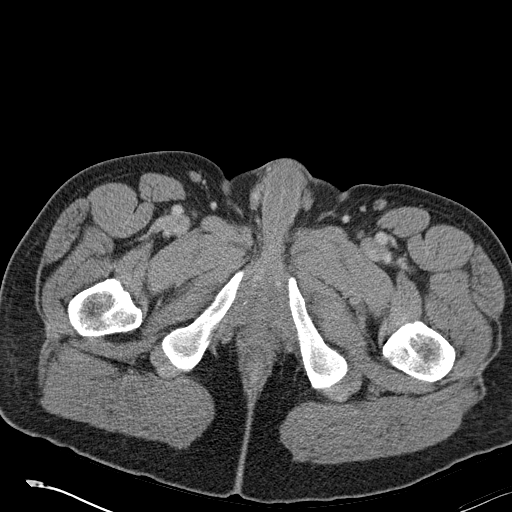
[im 20/90  soft-tissue]
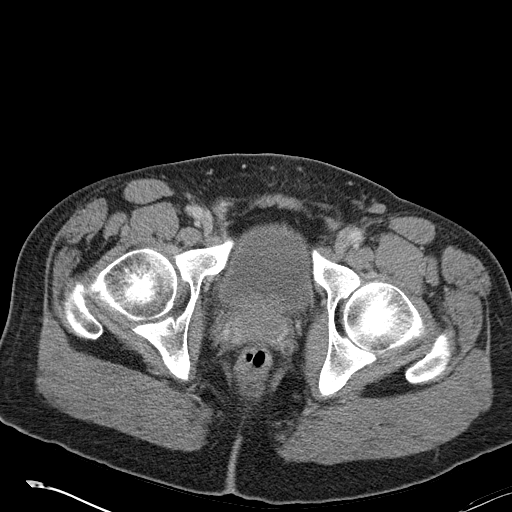
[im 25/90  soft-tissue]
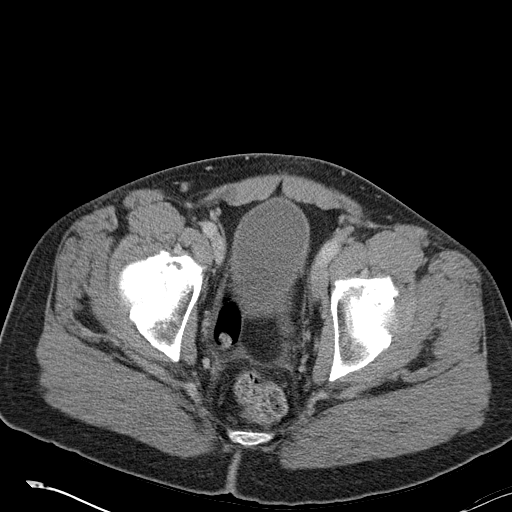
[im 30/90  soft-tissue]
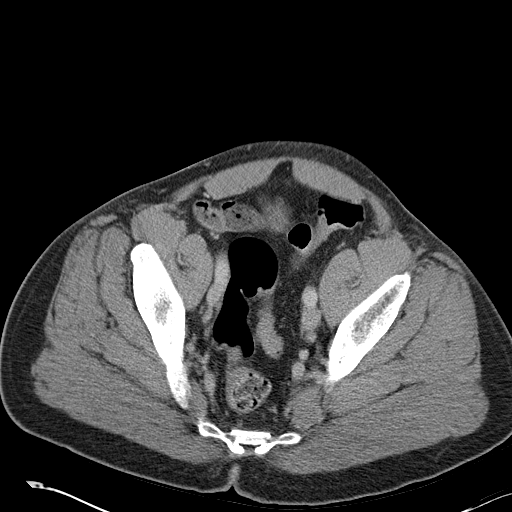
[im 40/90  soft-tissue]
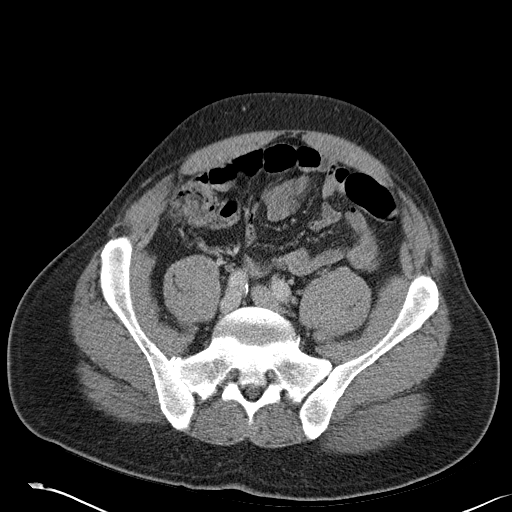
[im 45/90  soft-tissue]
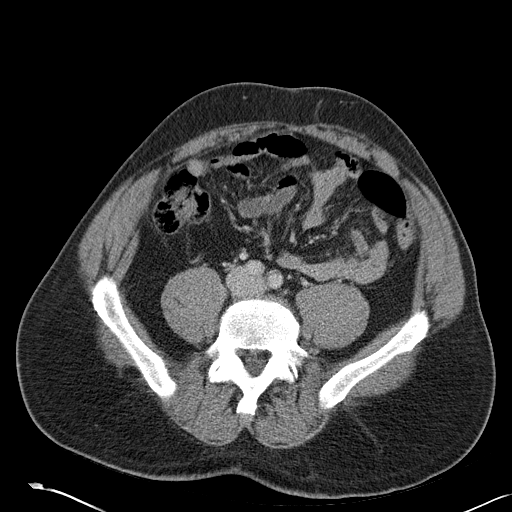
[im 50/90  soft-tissue]
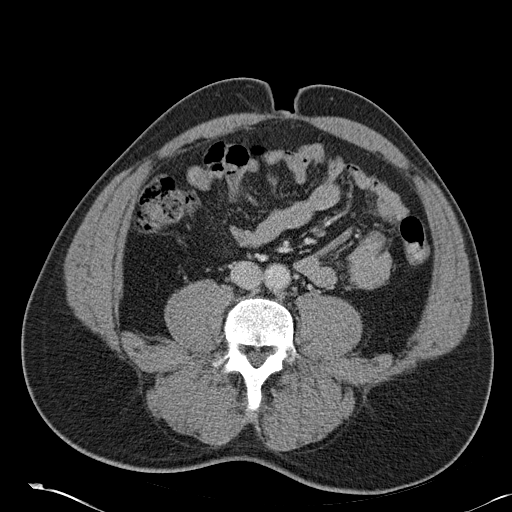
[im 60/90  soft-tissue]
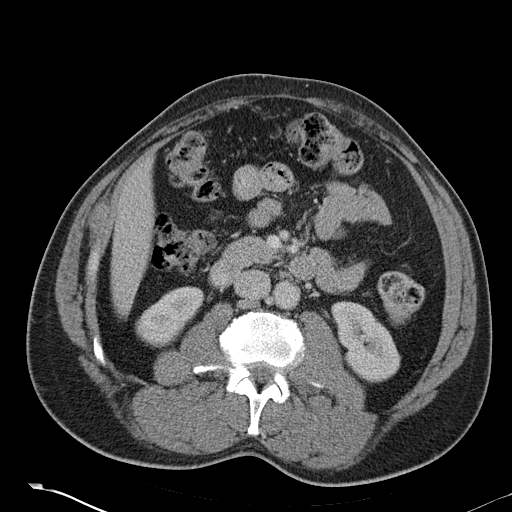
[im 60/90  bone]
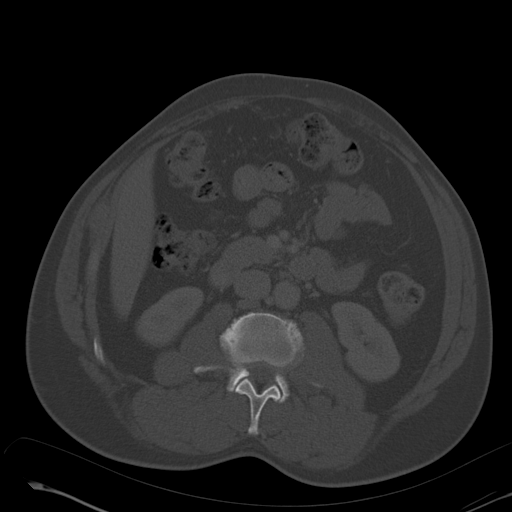
[im 65/90  soft-tissue]
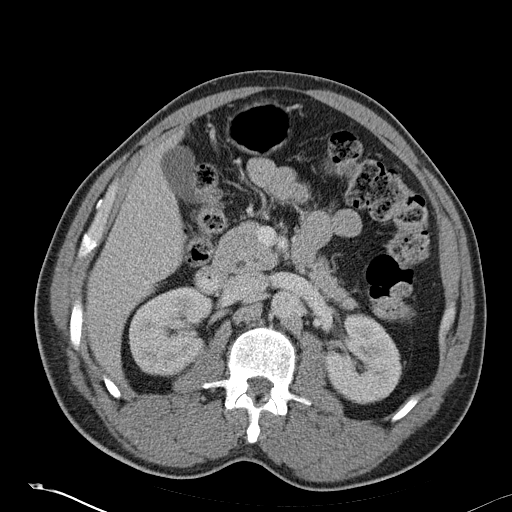
[im 70/90  soft-tissue]
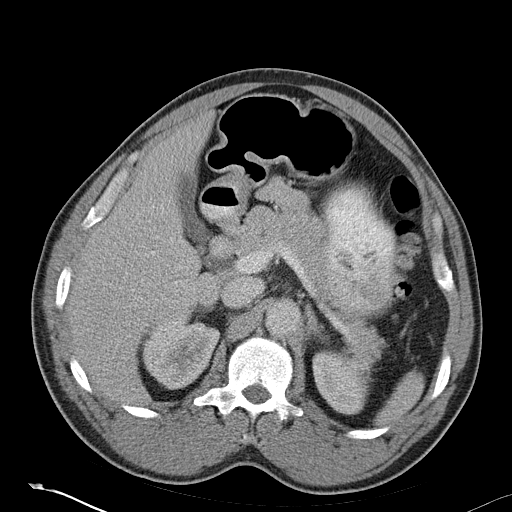
[im 80/90  soft-tissue]
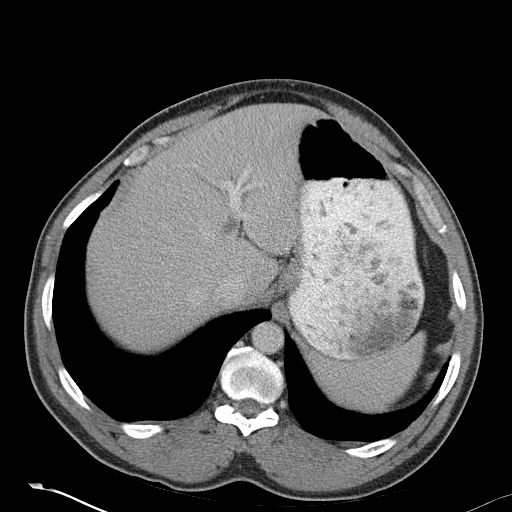
[im 85/90  soft-tissue]
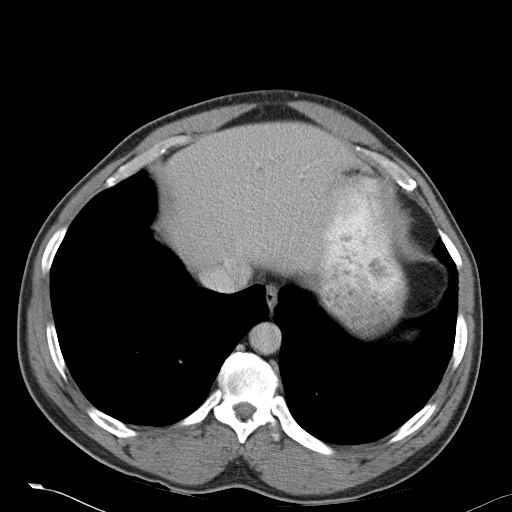

[Series 3: abd_pel_with 3.0 spo cor · coronal · 0.70mm/px · 3 of 103 slices shown]
[im 35/103  soft-tissue]
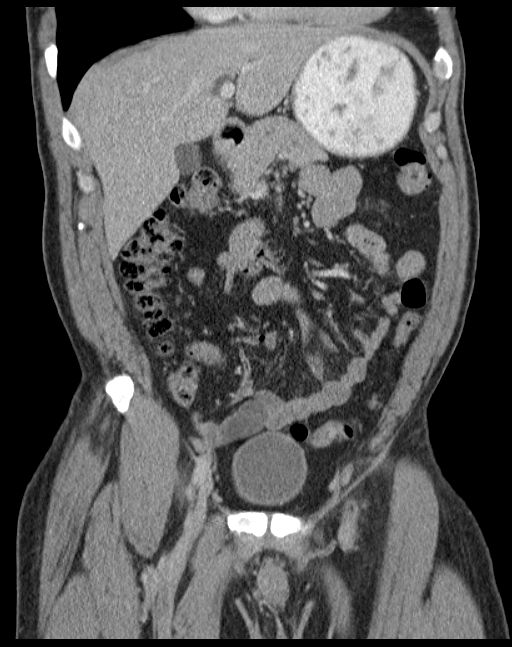
[im 46/103  soft-tissue]
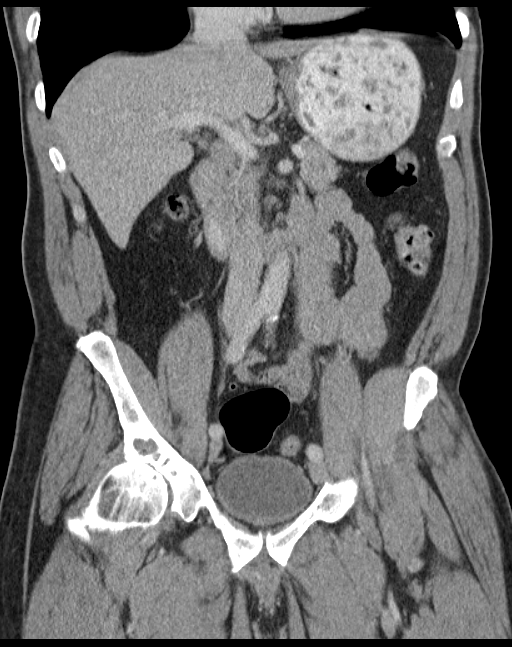
[im 57/103  soft-tissue]
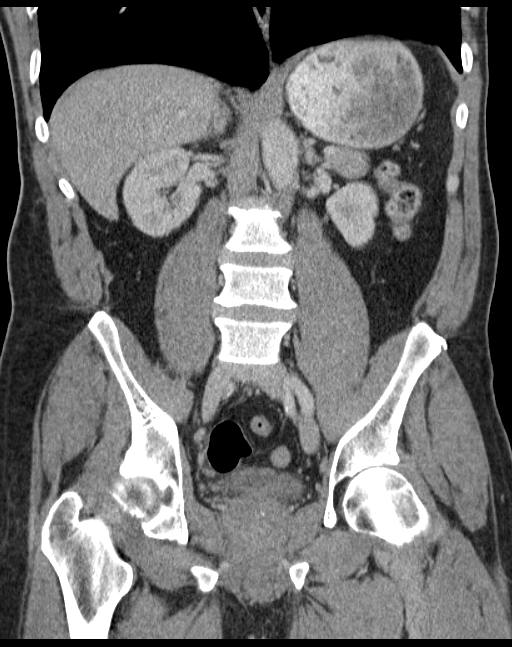

[16 of 46 positions shown; findings below may reference images not displayed]

FINDINGS: Lower chest:  The included lung bases are clear.

Liver: No focal lesion.  Prominent left lobe.

Hepatobiliary: Gallbladder physiologically distended, no calcified
stone. Prominence of the common bile duct measuring 12 mm at the
porta hepatis, however tapering is seen to the duodenum. Mild
central intrahepatic biliary ductal dilatation.

Pancreas: No ductal dilatation or inflammation.

Spleen: Normal.

Adrenal glands: No nodule.

Kidneys: Symmetric renal enhancement and excretion. No
hydronephrosis.

Stomach/Bowel: Stomach distended with ingested contrast. There are
no dilated or thickened small bowel loops. Moderate volume of stool
throughout the colon without colonic wall thickening. The appendix
is normal.

Vascular/Lymphatic: No retroperitoneal adenopathy. Abdominal aorta
is normal in caliber. Mild atherosclerosis without aneurysm.

Reproductive: Prostate gland normal in size.

Bladder: Physiologically distended, no wall thickening.

Other: No free air, free fluid, or intra-abdominal fluid collection.
Minimal fat in the left inguinal canal.

Musculoskeletal: There are no acute or suspicious osseous
abnormalities. Degenerative change of both hips, right greater than
left with subchondral cystic change.
IMPRESSION: 1. Prominence of the central intrahepatic and proximal extrahepatic
biliary tree. No abnormal gallbladder distention. This is of
doubtful clinical significance given abnormal LFTs. If there is
clinical concern for hepatobiliary pathology, initial evaluation
could be considered with ultrasound.
2. Otherwise no acute abnormality in the abdomen/pelvis.

## 2015-06-11 MED ORDER — OXYCODONE-ACETAMINOPHEN 5-325 MG PO TABS: 1.0000 | ORAL_TABLET | Freq: Four times a day (QID) | ORAL | Status: DC | PRN

## 2015-06-11 MED ORDER — ONDANSETRON HCL 4 MG/2ML IJ SOLN
4.0000 mg | Freq: Once | INTRAMUSCULAR | Status: AC
  Administered 2015-06-11: 4 mg via INTRAVENOUS
  Filled 2015-06-11: qty 2

## 2015-06-11 MED ORDER — LEVOFLOXACIN 500 MG PO TABS
500.0000 mg | ORAL_TABLET | Freq: Once | ORAL | Status: AC
  Administered 2015-06-11: 500 mg via ORAL
  Filled 2015-06-11: qty 1

## 2015-06-11 MED ORDER — SODIUM CHLORIDE 0.9 % IV BOLUS (SEPSIS)
1000.0000 mL | Freq: Once | INTRAVENOUS | Status: AC
  Administered 2015-06-11: 1000 mL via INTRAVENOUS

## 2015-06-11 MED ORDER — HYDROMORPHONE HCL 1 MG/ML IJ SOLN
1.0000 mg | Freq: Once | INTRAMUSCULAR | Status: AC
  Administered 2015-06-11: 1 mg via INTRAVENOUS
  Filled 2015-06-11: qty 1

## 2015-06-11 MED ORDER — ONDANSETRON 4 MG PO TBDP: 4.0000 mg | ORAL_TABLET | Freq: Three times a day (TID) | ORAL | Status: DC | PRN

## 2015-06-11 MED ORDER — DOCUSATE SODIUM 100 MG PO CAPS: 100.0000 mg | ORAL_CAPSULE | Freq: Two times a day (BID) | ORAL | Status: DC

## 2015-06-11 MED ORDER — HYDROMORPHONE HCL 1 MG/ML IJ SOLN
1.0000 mg | Freq: Once | INTRAMUSCULAR | Status: AC
Start: 2015-06-11 — End: 2015-06-11
  Administered 2015-06-11: 1 mg via INTRAVENOUS
  Filled 2015-06-11: qty 1

## 2015-06-11 MED ORDER — LEVOFLOXACIN 500 MG PO TABS: 500.0000 mg | ORAL_TABLET | Freq: Every day | ORAL | Status: DC

## 2015-06-11 MED ORDER — IOHEXOL 300 MG/ML  SOLN
100.0000 mL | Freq: Once | INTRAMUSCULAR | Status: AC | PRN
  Administered 2015-06-11: 100 mL via INTRAVENOUS

## 2015-06-11 MED ORDER — DEXTROSE 5 % IV SOLN
1.0000 g | Freq: Once | INTRAVENOUS | Status: AC
  Administered 2015-06-11: 1 g via INTRAVENOUS
  Filled 2015-06-11: qty 10

## 2015-06-11 NOTE — ED Notes (Signed)
Pt c/o pain in lower abd and lower back, states it started approx 11pm after eating, was dizzy, diaphoretic, nausea, vomiting.

## 2015-06-11 NOTE — ED Notes (Signed)
POC Occult Blood : POSITIVE  Control : (+/-)

## 2015-06-11 NOTE — Discharge Instructions (Signed)
Epididymitis °Epididymitis is swelling (inflammation) of the epididymis. The epididymis is a cord-like structure that is located along the top and back part of the testicle. It collects and stores sperm from the testicle. °This condition can also cause pain and swelling of the testicle and scrotum. Symptoms usually start suddenly (acute epididymitis). Sometimes epididymitis starts gradually and lasts for a while (chronic epididymitis). This type may be harder to treat. °CAUSES °In men 35 and younger, this condition is usually caused by a bacterial infection or sexually transmitted disease (STD), such as: °· Gonorrhea. °· Chlamydia.   °In men 35 and older who do not have anal sex, this condition is usually caused by bacteria from a blockage or abnormalities in the urinary system. These can result from: °· Having a tube placed into the bladder (urinary catheter). °· Having an enlarged or inflamed prostate gland. °· Having recent urinary tract surgery. °In men who have a condition that weakens the body's defense system (immune system), such as HIV, this condition can be caused by:  °· Other bacteria, including tuberculosis and syphilis. °· Viruses. °· Fungi. °Sometimes this condition occurs without infection. That may happen if urine flows backward into the epididymis after heavy lifting or straining. °RISK FACTORS °This condition is more likely to develop in men: °· Who have unprotected sex with more than one partner. °· Who have anal sex.   °· Who have recently had surgery.   °· Who have a urinary catheter. °· Who have urinary problems. °· Who have a suppressed immune system.   °SYMPTOMS  °This condition usually begins suddenly with chills, fever, and pain behind the scrotum and in the testicle. Other symptoms include:  °· Swelling of the scrotum, testicle, or both. °· Pain when ejaculating or urinating. °· Pain in the back or belly. °· Nausea. °· Itching and discharge from the penis. °· Frequent need to pass  urine. °· Redness and tenderness of the scrotum. °DIAGNOSIS °Your health care provider can diagnose this condition based on your symptoms and medical history. Your health care provider will also do a physical exam to ask about your symptoms and check your scrotum and testicle for swelling, pain, and redness. You may also have other tests, including:   °· Examination of discharge from the penis. °· Urine tests for infections, such as STDs.   °Your health care provider may test you for other STDs, including HIV.  °TREATMENT °Treatment for this condition depends on the cause. If your condition is caused by a bacterial infection, oral antibiotic medicine may be prescribed. If the bacterial infection has spread to your blood, you may need to receive IV antibiotics. Nonbacterial epididymitis is treated with home care that includes bed rest and elevation of the scrotum. °Surgery may be needed to treat: °· Bacterial epididymitis that causes pus to build up in the scrotum (abscess). °· Chronic epididymitis that has not responded to other treatments. °HOME CARE INSTRUCTIONS °Medicines  °· Take over-the-counter and prescription medicines only as told by your health care provider.   °· If you were prescribed an antibiotic medicine, take it as told by your health care provider. Do not stop taking the antibiotic even if your condition improves. °Sexual Activity  °· If your epididymitis was caused by an STD, avoid sexual activity until your treatment is complete. °· Inform your sexual partner or partners if you test positive for an STD. They may need to be treated. Do not engage in sexual activity with your partner or partners until their treatment is completed. °General Instructions  °· Return to your normal activities as told   by your health care provider. Ask your health care provider what activities are safe for you. °· Keep your scrotum elevated and supported while resting. Ask your health care provider if you should wear a  scrotal support, such as a jockstrap. Wear it as told by your health care provider. °· If directed, apply ice to the affected area:   °¨ Put ice in a plastic bag. °¨ Place a towel between your skin and the bag. °¨ Leave the ice on for 20 minutes, 2-3 times per day. °· Try taking a sitz bath to help with discomfort. This is a warm water bath that is taken while you are sitting down. The water should only come up to your hips and should cover your buttocks. Do this 3-4 times per day or as told by your health care provider. °· Keep all follow-up visits as told by your health care provider. This is important. °SEEK MEDICAL CARE IF:  °· You have a fever.   °· Your pain medicine is not helping.   °· Your pain is getting worse.   °· Your symptoms do not improve within three days. °  °This information is not intended to replace advice given to you by your health care provider. Make sure you discuss any questions you have with your health care provider. °  °Document Released: 05/12/2000 Document Revised: 02/03/2015 Document Reviewed: 09/30/2014 °Elsevier Interactive Patient Education ©2016 [employer] ° °Orchitis °Orchitis is swelling (inflammation) of a testicle caused by infection. Testicles are the male organs that produce sperm. The testicles are held in a fleshy sac (scrotum) located behind the penis. Orchitis usually affects only one testicle, but it can occur in both. The condition can develop suddenly. °Orchitis can be caused by many different kinds of bacteria and viruses. °CAUSES °Orchitis can be caused by either a bacterial or viral infection. °Bacterial Infections °· These often occur along with an infection of the coiled tube that collects sperm and sits on top of the testicle (epididymis). °· In men who are not sexually active, bacterial orchitis usually starts as a urinary tract infection and spreads to the testicle. °· In sexually active men, sexually transmitted infections are the most common cause of  bacterial orchitis. These can include: °¨ Gonorrhea. °¨ Chlamydia. °Viral Infections °· Mumps is still the most common cause of viral orchitis, though mumps is now rare in many areas because of vaccination. °· Other viruses that can cause orchitis include: °¨ The chickenpox virus (varicella-zoster virus). °¨ The virus that causes mononucleosis (Epstein-Barr virus). °RISK FACTORS °Boys and men who have not been vaccinated against mumps are at risk for mumps orchitis. °Risk factors for bacterial orchitis include: °· Frequent urinary tract infections. °· High-risk sexual behaviors. °· Having a sexual partner with a sexually transmitted infection. °· Having had urinary tract surgery. °· Using a tube passed through the penis to drain urine (Foley catheter). °· An enlarged prostate gland. °SIGNS AND SYMPTOMS °The most common symptoms of orchitis are swelling and pain in the scrotum. Other signs and symptoms may include: °· Feeling generally sick (malaise). °· Fever and chills. °· Painful urination. °· Painful ejaculation. °· Blood or discharge from the penis. °· Nausea. °· Headache. °· Fatigue. °DIAGNOSIS °Your health care provider may suspect orchitis if you have a painful, swollen testicle along with other signs and symptoms of the condition. A physical exam will be done. Tests may also be done to help your health care provider make a diagnosis. These may include: °· A blood test   to check for signs of infection. °· A urine test to check for a urinary tract infection. °· Using a swab to collect a fluid sample from the tip of the penis to test for sexually transmitted infections. °· Taking an image of the testicle using sound waves and a computer (testicular ultrasound). °TREATMENT °Treatment of orchitis depends on the cause. For orchitis caused by a bacterial infection, your health care provider will most likely prescribe antibiotic medicines. Bacterial infections usually clear up within a few days. °Both viral  infections and bacterial infections may be treated with: °· Bed rest. °· Anti-inflammatory medicines. °· Pain medicines. °· Elevating the scrotum and applying ice. °HOME CARE INSTRUCTIONS °· Rest as directed by your health care provider. °· Take medicines only as directed by your health care provider. °· If you were prescribed an antibiotic medicine, finish it all even if you start to feel better. °· Elevate your scrotum and apply ice as directed: °¨ Put ice in a plastic bag. °¨ Place a small towel or pillow between your legs. °¨ Rest your scrotum on the pillow or towel. °¨ Place another towel between your skin and the plastic bag. °¨ Leave the ice on for 20 minutes, 2-3 times a day. °SEEK MEDICAL CARE IF: °· You have a fever. °· Pain and swelling have not gotten better after 3 days. °SEEK IMMEDIATE MEDICAL CARE IF: °· Your pain is getting worse. °· The swelling in your testicle gets worse. °  °This information is not intended to replace advice given to you by your health care provider. Make sure you discuss any questions you have with your health care provider. °  °Document Released: 05/12/2000 Document Revised: 06/05/2014 Document Reviewed: 10/02/2013 °Elsevier Interactive Patient Education ©2016 [employer] ° °

## 2015-06-11 NOTE — ED Notes (Signed)
MD at bedside. 
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
## 2015-06-13 LAB — URINE CULTURE: Culture: 30000

## 2015-06-14 ENCOUNTER — Telehealth (HOSPITAL_BASED_OUTPATIENT_CLINIC_OR_DEPARTMENT_OTHER): Payer: Self-pay | Admitting: Emergency Medicine

## 2015-06-14 NOTE — Telephone Encounter (Signed)
Post ED Visit - Positive Culture Follow-up  Culture report reviewed by antimicrobial stewardship pharmacist:  []  Enzo BiNathan Batchelder, Pharm.D. []  Celedonio MiyamotoJeremy Frens, [address] BCPS []  Garvin FilaMike Maccia, Pharm.D. []  Georgina PillionElizabeth Martin, Pharm.D., BCPS []  Cross RoadsMinh Pham, [address]Ph[address]D., BCPS, AAHIVP []  Estella HuskMichelle Turner, Pharm.D., BCPS, AAHIVP []  Tennis Mustassie Stewart, Pharm.D. []  Sherle Poeob Vincent, [address]  Positive urine culture Treated with levofloxacin, organism sensitive to the same and no further patient follow-up is required at this time.  Berle MullMiller, Huong Luthi 06/14/2015, 11:28 AM

## 2015-07-14 ENCOUNTER — Ambulatory Visit (INDEPENDENT_AMBULATORY_CARE_PROVIDER_SITE_OTHER): Payer: Self-pay | Admitting: Urology

## 2015-07-14 DIAGNOSIS — N451 Epididymitis: Secondary | ICD-10-CM

## 2015-07-14 DIAGNOSIS — N410 Acute prostatitis: Secondary | ICD-10-CM

## 2015-08-11 ENCOUNTER — Ambulatory Visit: Payer: Self-pay | Admitting: Urology

## 2015-09-08 ENCOUNTER — Ambulatory Visit: Payer: Self-pay | Admitting: Urology

## 2016-06-21 ENCOUNTER — Other Ambulatory Visit (HOSPITAL_COMMUNITY)
Admission: AD | Admit: 2016-06-21 | Discharge: 2016-06-21 | Disposition: A | Discharge status: Home / Self Care | Payer: Medicaid Other | Source: Skilled Nursing Facility | Attending: Urology | Admitting: Urology

## 2016-06-21 ENCOUNTER — Ambulatory Visit (INDEPENDENT_AMBULATORY_CARE_PROVIDER_SITE_OTHER): Payer: Medicaid Other | Admitting: Urology

## 2016-06-21 DIAGNOSIS — R310 Gross hematuria: Secondary | ICD-10-CM

## 2016-06-21 DIAGNOSIS — R300 Dysuria: Secondary | ICD-10-CM | POA: Diagnosis not present

## 2016-06-27 ENCOUNTER — Other Ambulatory Visit: Payer: Self-pay | Admitting: Urology

## 2016-06-27 DIAGNOSIS — R310 Gross hematuria: Secondary | ICD-10-CM

## 2016-07-07 ENCOUNTER — Other Ambulatory Visit (HOSPITAL_COMMUNITY)
Admission: RE | Admit: 2016-07-07 | Discharge: 2016-07-07 | Disposition: A | Discharge status: Home / Self Care | Payer: Medicaid Other | Source: Ambulatory Visit | Attending: Urology | Admitting: Urology

## 2016-07-07 DIAGNOSIS — R310 Gross hematuria: Secondary | ICD-10-CM | POA: Diagnosis not present

## 2016-07-07 LAB — BASIC METABOLIC PANEL
Anion gap: 6 (ref 5–15)
BUN: 22 mg/dL — ABNORMAL HIGH (ref 6–20)
CO2: 31 mmol/L (ref 22–32)
Calcium: 9 mg/dL (ref 8.9–10.3)
Chloride: 103 mmol/L (ref 101–111)
Creatinine, Ser: 1.08 mg/dL (ref 0.61–1.24)
GFR calc Af Amer: >60 mL/min (ref >60)
GFR calc non Af Amer: >60 mL/min (ref >60)
Glucose, Bld: 110 mg/dL — ABNORMAL HIGH (ref 65–99)
Potassium: 3.7 mmol/L (ref 3.5–5.1)
Sodium: 140 mmol/L (ref 135–145)

## 2016-07-17 ENCOUNTER — Ambulatory Visit (HOSPITAL_COMMUNITY)
Admission: RE | Admit: 2016-07-17 | Discharge: 2016-07-17 | Disposition: A | Discharge status: Home / Self Care | Payer: Medicaid Other | Source: Ambulatory Visit | Attending: Urology | Admitting: Urology

## 2016-07-17 DIAGNOSIS — I700 Atherosclerosis of aorta: Secondary | ICD-10-CM | POA: Insufficient documentation

## 2016-07-17 DIAGNOSIS — R310 Gross hematuria: Secondary | ICD-10-CM | POA: Insufficient documentation

## 2016-07-17 IMAGING — CT CT ABD-PEL WO/W CM
3 of 12 series · 13 of 46 positions shown, 19 images · IV contrast (Isovue)
Comparison: None.

CLINICAL DATA: Low back pain, LEFT lower quadrant pain

EXAM:
CT ABDOMEN AND PELVIS WITHOUT AND WITH CONTRAST
TECHNIQUE: Multidetector CT imaging of the abdomen and pelvis was performed
following the standard protocol before and following the bolus
administration of intravenous contrast.
CONTRAST:  125mL [9B] IOPAMIDOL ([9B]) INJECTION 61%

[Series 3: axial post · axial · 0.83mm/px · z∈[+928,+1268]mm · 7 of 92 slices shown, 12 images]
[im 12/92  soft-tissue]
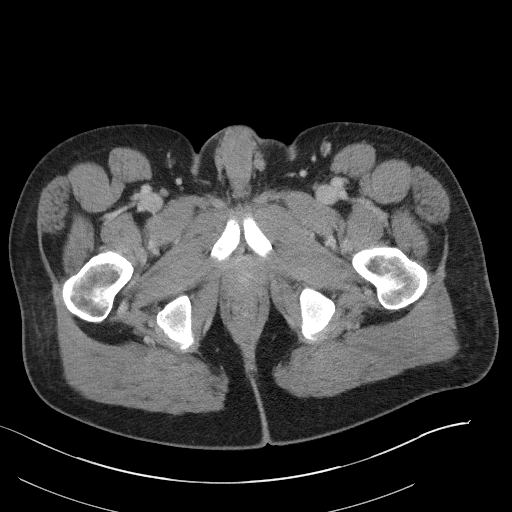
[im 12/92  bone]
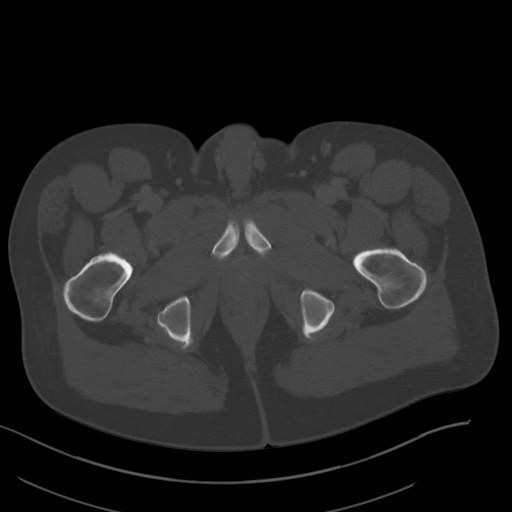
[im 23/92  soft-tissue]
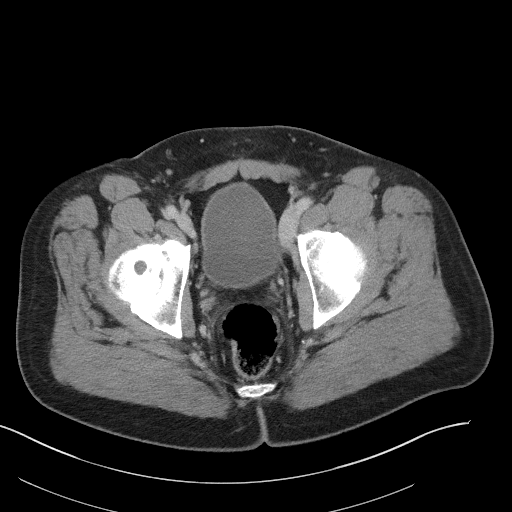
[im 35/92  soft-tissue]
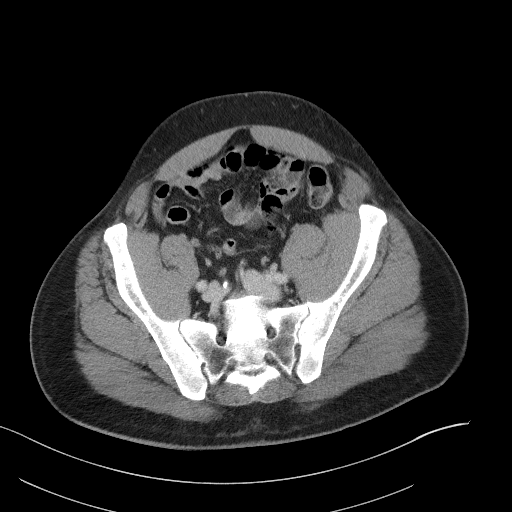
[im 46/92  soft-tissue]
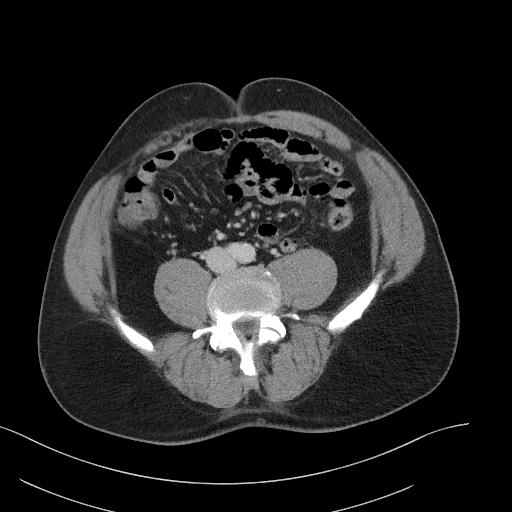
[im 46/92  lung]
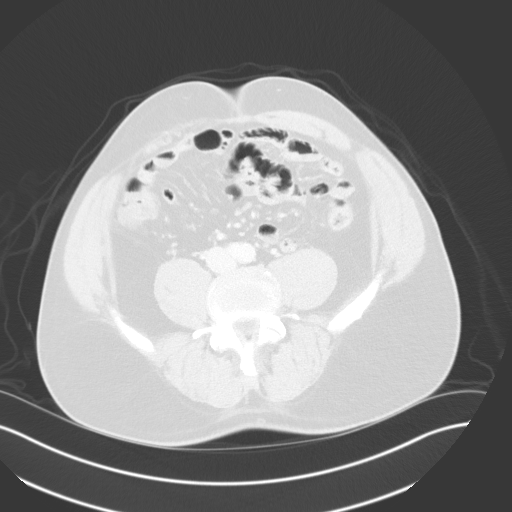
[im 57/92  soft-tissue]
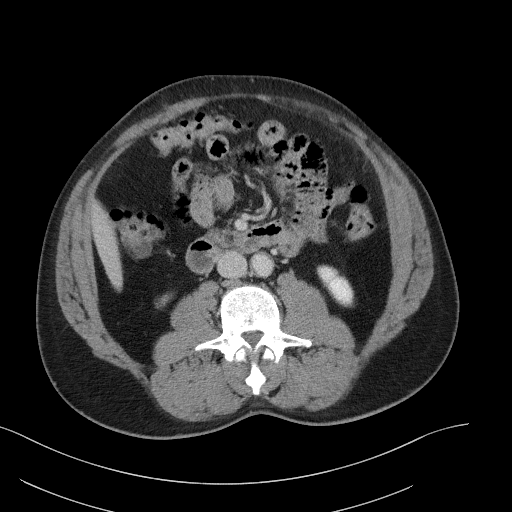
[im 57/92  lung]
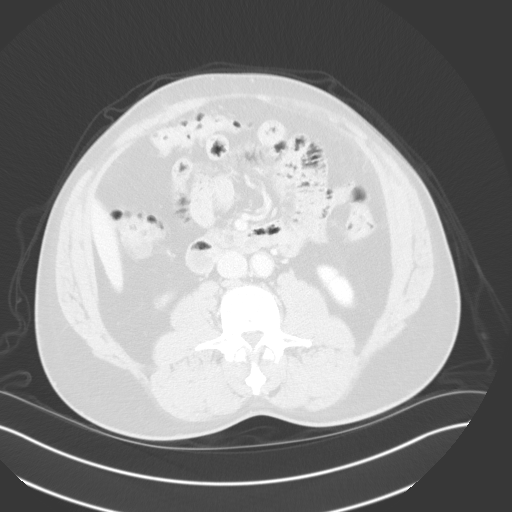
[im 69/92  soft-tissue]
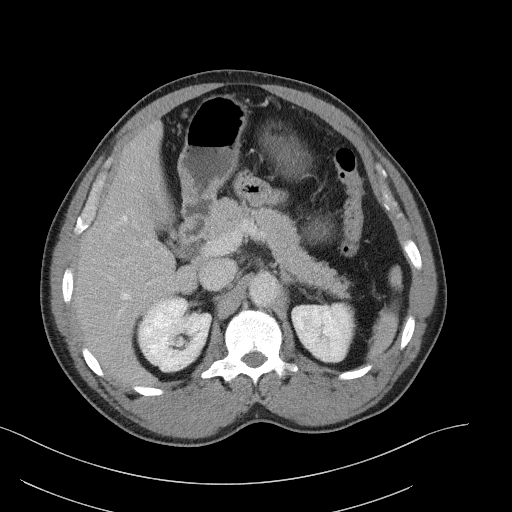
[im 69/92  lung]
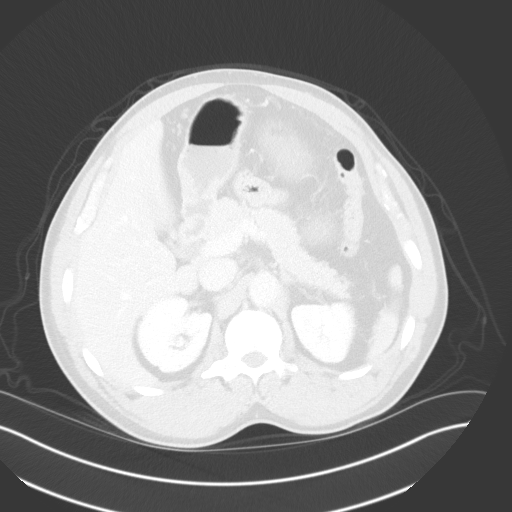
[im 80/92  soft-tissue]
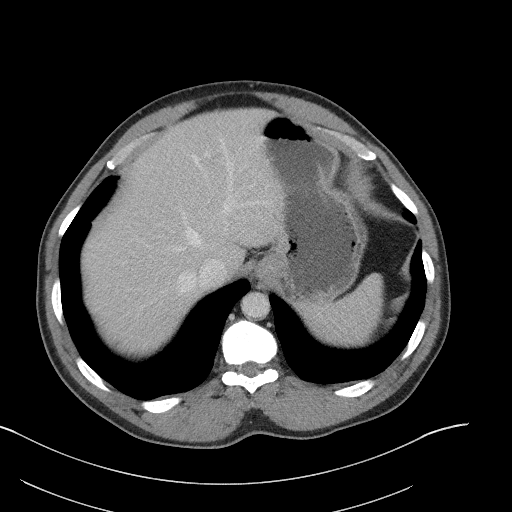
[im 80/92  lung]
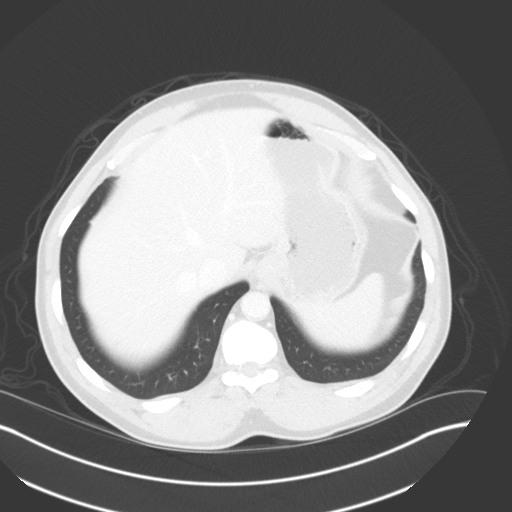

[Series 4: axial delay · axial · delayed · 0.83mm/px · z∈[+928,+1098]mm · 4 of 92 slices shown]
[im 12/92  soft-tissue]
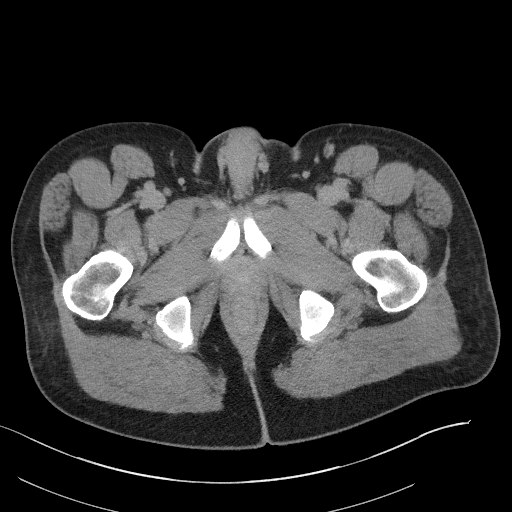
[im 23/92  soft-tissue]
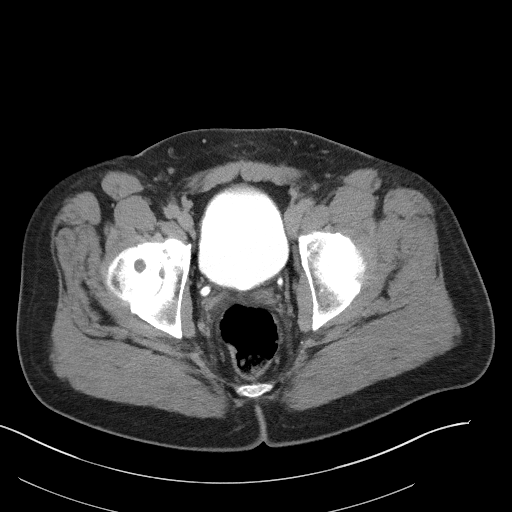
[im 35/92  soft-tissue]
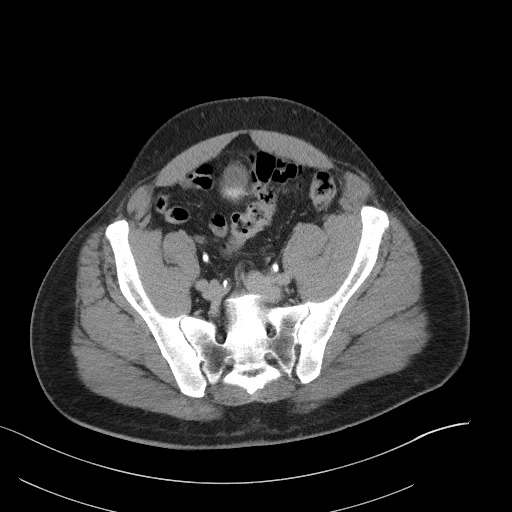
[im 46/92  soft-tissue]
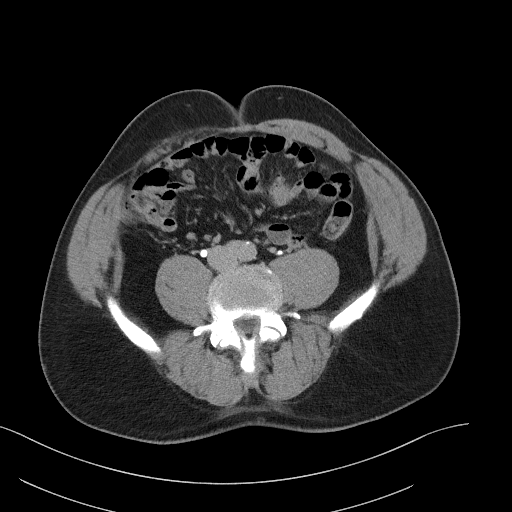

[Series 7: coronal pre · coronal · non-contrast · 0.82mm/px · 2 of 100 slices shown, 3 images]
[im 34/100  soft-tissue]
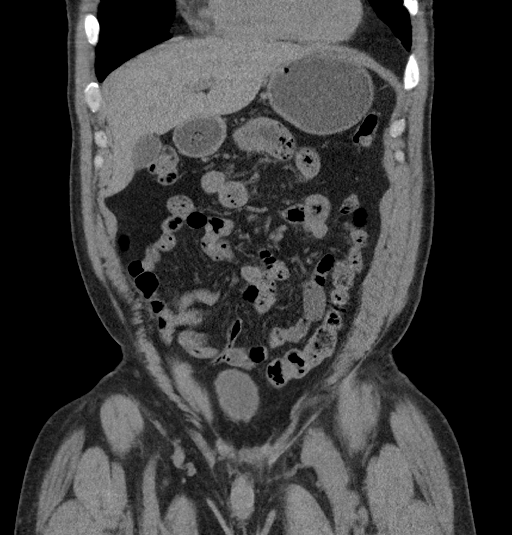
[im 34/100  bone]
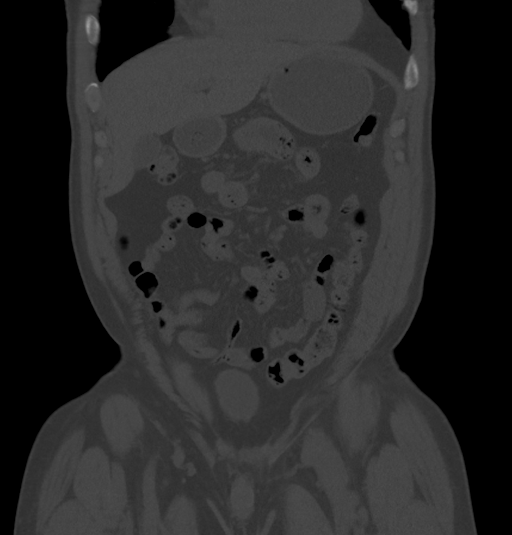
[im 67/100  soft-tissue]
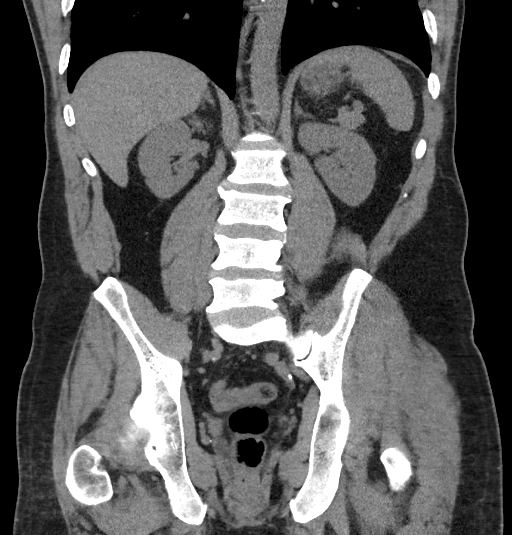

[13 of 46 positions shown; findings below may reference images not displayed]

FINDINGS: Lower chest: Lung bases are clear.

Hepatobiliary: No focal hepatic lesion. No biliary duct dilatation.
Gallbladder is normal. Common bile duct is normal.

Pancreas: Pancreas is normal. No ductal dilatation. No pancreatic
inflammation.

Spleen: Normal spleen

Adrenals/urinary tract: Adrenal glands normal. No nephrolithiasis
for ureterolithiasis. No enhancing renal cortical lesions. No
filling defects within collecting systems or ureters.

No bladder calculi, enhancing bladder lesions, or filling defect
within the bladder.

Stomach/Bowel: Stomach, small bowel, appendix, and cecum are normal.
The colon and rectosigmoid colon are normal.

Vascular/Lymphatic: Abdominal aorta is normal caliber with
atherosclerotic calcification. There is no retroperitoneal or
periportal lymphadenopathy. No pelvic lymphadenopathy.

Reproductive: Prostate gland normal.

Other: No free fluid.

Musculoskeletal: Cystic change in the RIGHT acetabulum unchanged
from comparison exam and a favored degenerative.
IMPRESSION: 1. No explanation for hematuria. No nephrolithiasis,
ureterolithiasis, enhancing renal cortical lesion, or filling
defects within the collecting systems.
2. No bladder stones or filling defects in the bladder which does
not excluded a bladder lesion.
3.  Atherosclerotic calcification of the aorta.

## 2016-07-17 MED ORDER — IOPAMIDOL (ISOVUE-300) INJECTION 61%
150.0000 mL | Freq: Once | INTRAVENOUS | Status: AC | PRN
  Administered 2016-07-17: 125 mL via INTRAVENOUS

## 2016-08-02 ENCOUNTER — Ambulatory Visit: Payer: Medicaid Other | Admitting: Urology

## 2016-09-27 ENCOUNTER — Ambulatory Visit (INDEPENDENT_AMBULATORY_CARE_PROVIDER_SITE_OTHER): Payer: Medicaid Other | Admitting: Urology

## 2016-09-27 DIAGNOSIS — R310 Gross hematuria: Secondary | ICD-10-CM | POA: Diagnosis not present

## 2017-01-03 ENCOUNTER — Ambulatory Visit: Payer: Medicaid Other | Admitting: Urology

## 2017-06-26 ENCOUNTER — Emergency Department (HOSPITAL_COMMUNITY)
Admission: EM | Admit: 2017-06-26 | Discharge: 2017-06-26 | Disposition: A | Discharge status: Home / Self Care | Payer: Medicaid Other | Attending: Emergency Medicine | Admitting: Emergency Medicine

## 2017-06-26 ENCOUNTER — Encounter (HOSPITAL_COMMUNITY): Payer: Self-pay

## 2017-06-26 ENCOUNTER — Emergency Department (HOSPITAL_COMMUNITY): Payer: Medicaid Other

## 2017-06-26 ENCOUNTER — Other Ambulatory Visit: Payer: Self-pay

## 2017-06-26 DIAGNOSIS — W010XXA Fall on same level from slipping, tripping and stumbling without subsequent striking against object, initial encounter: Secondary | ICD-10-CM | POA: Diagnosis not present

## 2017-06-26 DIAGNOSIS — Z79899 Other long term (current) drug therapy: Secondary | ICD-10-CM | POA: Diagnosis not present

## 2017-06-26 DIAGNOSIS — S7011XA Contusion of right thigh, initial encounter: Secondary | ICD-10-CM | POA: Insufficient documentation

## 2017-06-26 DIAGNOSIS — M25551 Pain in right hip: Secondary | ICD-10-CM | POA: Diagnosis present

## 2017-06-26 DIAGNOSIS — Y92008 Other place in unspecified non-institutional (private) residence as the place of occurrence of the external cause: Secondary | ICD-10-CM | POA: Diagnosis not present

## 2017-06-26 DIAGNOSIS — S7001XA Contusion of right hip, initial encounter: Secondary | ICD-10-CM | POA: Diagnosis not present

## 2017-06-26 DIAGNOSIS — Z91010 Allergy to peanuts: Secondary | ICD-10-CM | POA: Diagnosis not present

## 2017-06-26 DIAGNOSIS — J45909 Unspecified asthma, uncomplicated: Secondary | ICD-10-CM | POA: Diagnosis not present

## 2017-06-26 DIAGNOSIS — Y9301 Activity, walking, marching and hiking: Secondary | ICD-10-CM | POA: Diagnosis not present

## 2017-06-26 DIAGNOSIS — Y998 Other external cause status: Secondary | ICD-10-CM | POA: Diagnosis not present

## 2017-06-26 DIAGNOSIS — M1611 Unilateral primary osteoarthritis, right hip: Secondary | ICD-10-CM | POA: Diagnosis not present
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
## 2017-06-26 MED ORDER — IBUPROFEN 800 MG PO TABS
800.0000 mg | ORAL_TABLET | Freq: Once | ORAL | Status: AC
  Administered 2017-06-26: 800 mg via ORAL
  Filled 2017-06-26: qty 1

## 2017-06-26 MED ORDER — DEXAMETHASONE 4 MG PO TABS: 4.0000 mg | ORAL_TABLET | Freq: Two times a day (BID) | ORAL | 0 refills | Status: DC

## 2017-06-26 MED ORDER — HYDROCODONE-ACETAMINOPHEN 5-325 MG PO TABS
2.0000 | ORAL_TABLET | Freq: Once | ORAL | Status: AC
  Administered 2017-06-26: 2 via ORAL
  Filled 2017-06-26: qty 2

## 2017-06-26 MED ORDER — TRAMADOL HCL 50 MG PO TABS: 50.0000 mg | ORAL_TABLET | Freq: Four times a day (QID) | ORAL | 0 refills | Status: DC | PRN

## 2017-06-26 MED ORDER — ONDANSETRON HCL 4 MG PO TABS
4.0000 mg | ORAL_TABLET | Freq: Once | ORAL | Status: AC
  Administered 2017-06-26: 4 mg via ORAL
  Filled 2017-06-26: qty 1

## 2017-06-26 NOTE — ED Notes (Addendum)
Pt wheeled to waiting room. Pt verbalized understanding of discharge instructions. Pt stated he was calling his sister for a ride.

## 2017-06-26 NOTE — ED Triage Notes (Signed)
Reports of right hip pain x1 week r/t falling. States he was coming out of door and lost balance as well as fell d/t tripping on carpet. HX of chronic right hip pain x2 years per patient.

## 2017-06-26 NOTE — Discharge Instructions (Signed)
The x-Aldrin of your hip is negative for fracture or dislocation.  There is advanced arthritis present.  I suspect that you have a contusion and an exacerbation of your arthritis.  Please use Decadron 2 times daily with food.  Use Tylenol for mild pain, use Ultram for more severe pain. This medication may cause drowsiness. Please do not drink, drive, or participate in activity that requires concentration while taking this medication.
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
## 2017-07-18 ENCOUNTER — Other Ambulatory Visit: Payer: Self-pay

## 2017-07-18 ENCOUNTER — Ambulatory Visit (INDEPENDENT_AMBULATORY_CARE_PROVIDER_SITE_OTHER): Payer: Medicaid Other | Admitting: Family Medicine

## 2017-07-18 ENCOUNTER — Encounter: Payer: Self-pay | Admitting: Family Medicine

## 2017-07-18 VITALS — BP 138/82 | HR 62 | Temp 97.0°F | Resp 16 | Ht 70.0 in | Wt 213.2 lb

## 2017-07-18 DIAGNOSIS — Z1159 Encounter for screening for other viral diseases: Secondary | ICD-10-CM | POA: Diagnosis not present

## 2017-07-18 DIAGNOSIS — M1651 Unilateral post-traumatic osteoarthritis, right hip: Secondary | ICD-10-CM | POA: Diagnosis not present

## 2017-07-18 DIAGNOSIS — Z114 Encounter for screening for human immunodeficiency virus [HIV]: Secondary | ICD-10-CM | POA: Diagnosis not present

## 2017-07-18 DIAGNOSIS — Z1211 Encounter for screening for malignant neoplasm of colon: Secondary | ICD-10-CM

## 2017-07-18 DIAGNOSIS — R739 Hyperglycemia, unspecified: Secondary | ICD-10-CM

## 2017-07-18 DIAGNOSIS — Z23 Encounter for immunization: Secondary | ICD-10-CM | POA: Diagnosis not present
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
## 2017-07-19 LAB — BASIC METABOLIC PANEL
BUN: 17 mg/dL (ref 7–25)
CO2: 30 mmol/L (ref 20–32)
Calcium: 9.9 mg/dL (ref 8.6–10.3)
Chloride: 102 mmol/L (ref 98–110)
Creat: 1.09 mg/dL (ref 0.70–1.25)
Glucose, Bld: 102 mg/dL (ref 65–139)
Potassium: 4.7 mmol/L (ref 3.5–5.3)
Sodium: 140 mmol/L (ref 135–146)

## 2017-07-19 LAB — HEMOGLOBIN A1C
Hgb A1c MFr Bld: 6.2 % of total Hgb — ABNORMAL HIGH (ref <5.7)
Mean Plasma Glucose: 131 (calc)
eAG (mmol/L): 7.3 (calc)

## 2017-07-19 LAB — HEPATITIS C ANTIBODY
Hepatitis C Ab: NONREACTIVE
SIGNAL TO CUT-OFF: 0.01 (ref <1.00)

## 2017-07-19 LAB — HIV ANTIBODY (ROUTINE TESTING W REFLEX): HIV 1&2 Ab, 4th Generation: NONREACTIVE

## 2017-07-20 ENCOUNTER — Encounter: Payer: Self-pay | Admitting: Family Medicine

## 2017-08-06 LAB — COLOGUARD: Cologuard: NEGATIVE

## 2017-08-27 ENCOUNTER — Encounter: Payer: Self-pay | Admitting: Family Medicine

## 2017-08-28 ENCOUNTER — Ambulatory Visit: Payer: Medicaid Other | Admitting: Family Medicine

## 2017-08-28 ENCOUNTER — Telehealth: Payer: Self-pay | Admitting: Family Medicine

## 2017-08-28 NOTE — Telephone Encounter (Signed)
Patient came in because he didn't get my message to r/s appt. I r/s him for next Thursday morning.  He wanted you to know his hip surgeon is Dr.Case at [facility] [place]. Phone [phone] Fax [phone]

## 2017-08-28 NOTE — Telephone Encounter (Signed)
Called patient to r/s, no answer, lvm.

## 2017-09-06 ENCOUNTER — Other Ambulatory Visit: Payer: Self-pay

## 2017-09-06 ENCOUNTER — Encounter: Payer: Self-pay | Admitting: Family Medicine

## 2017-09-06 ENCOUNTER — Ambulatory Visit (INDEPENDENT_AMBULATORY_CARE_PROVIDER_SITE_OTHER): Payer: Medicaid Other | Admitting: Family Medicine

## 2017-09-06 VITALS — BP 160/90 | HR 86 | Temp 98.3°F | Resp 16 | Ht 70.0 in | Wt 208.8 lb

## 2017-09-06 DIAGNOSIS — Z79899 Other long term (current) drug therapy: Secondary | ICD-10-CM | POA: Insufficient documentation

## 2017-09-06 DIAGNOSIS — R7303 Prediabetes: Secondary | ICD-10-CM | POA: Diagnosis not present

## 2017-09-06 DIAGNOSIS — I10 Essential (primary) hypertension: Secondary | ICD-10-CM | POA: Diagnosis not present

## 2017-09-06 DIAGNOSIS — E663 Overweight: Secondary | ICD-10-CM | POA: Insufficient documentation

## 2017-09-06 DIAGNOSIS — F1010 Alcohol abuse, uncomplicated: Secondary | ICD-10-CM | POA: Diagnosis not present

## 2017-09-06 MED ORDER — AMLODIPINE BESYLATE 5 MG PO TABS: 5.0000 mg | ORAL_TABLET | Freq: Every day | ORAL | 3 refills | Status: DC

## 2017-09-06 NOTE — Patient Instructions (Signed)
Alcohol Use Disorder Alcohol use disorder is when your drinking disrupts your daily life. When you have this condition, you drink too much alcohol and you cannot control your drinking. Alcohol use disorder can cause serious problems with your physical health. It can affect your brain, heart, liver, pancreas, immune system, stomach, and intestines. Alcohol use disorder can increase your risk for certain cancers and cause problems with your mental health, such as depression, anxiety, psychosis, delirium, and dementia. People with this disorder risk hurting themselves and others. What are the causes? This condition is caused by drinking too much alcohol over time. It is not caused by drinking too much alcohol only one or two times. Some people with this condition drink alcohol to cope with or escape from negative life events. Others drink to relieve pain or symptoms of mental illness. What increases the risk? You are more likely to develop this condition if:  You have a family history of alcohol use disorder.  Your culture encourages drinking to the point of intoxication, or makes alcohol easy to get.  You had a mood or conduct disorder in childhood.  You have been a victim of abuse.  You are an adolescent and: ? You have poor grades or difficulties in school. ? Your caregivers do not talk to you about saying no to alcohol, or supervise your activities. ? You are impulsive or you have trouble with self-control.  What are the signs or symptoms? Symptoms of this condition include:  Drinkingmore than you want to.  Drinking for longer than you want to.  Trying several times to drink less or to control your drinking.  Spending a lot of time getting alcohol, drinking, or recovering from drinking.  Craving alcohol.  Having problems at work, at school, or at home due to drinking.  Having problems in relationships due to drinking.  Drinking when it is dangerous to drink, such as before  driving a car.  Continuing to drink even though you know you might have a physical or mental problem related to drinking.  Needing more and more alcohol to get the same effect you want from the alcohol (building up tolerance).  Having symptoms of withdrawal when you stop drinking. Symptoms of withdrawal include: ? Fatigue. ? Nightmares. ? Trouble sleeping. ? Depression. ? Anxiety. ? Fever. ? Seizures. ? Severe confusion. ? Feeling or seeing things that are not there (hallucinations). ? Tremors. ? Rapid heart rate. ? Rapid breathing. ? High blood pressure.  Drinking to avoid symptoms of withdrawal.  How is this diagnosed? This condition is diagnosed with an assessment. Your health care provider may start the assessment by asking three or four questions about your drinking. Your health care provider may perform a physical exam or do lab tests to see if you have physical problems resulting from alcohol use. She or he may refer you to a mental health professional for evaluation. How is this treated? Some people with alcohol use disorder are able to reduce their alcohol use to low-risk levels. Others need to completely quit drinking alcohol. When necessary, mental health professionals with specialized training in substance use treatment can help. Your health care provider can help you decide how severe your alcohol use disorder is and what type of treatment you need. The following forms of treatment are available:  Detoxification. Detoxification involves quitting drinking and using prescription medicines within the first week to help lessen withdrawal symptoms. This treatment is important for people who have had withdrawal symptoms before and for   heavy drinkers who are likely to have withdrawal symptoms. Alcohol withdrawal can be dangerous, and in severe cases, it can cause death. Detoxification may be provided in a home, community, or primary care setting, or in a hospital or substance use  treatment facility.  Counseling. This treatment is also called talk therapy. It is provided by substance use treatment counselors. A counselor can address the reasons you use alcohol and suggest ways to keep you from drinking again or to prevent problem drinking. The goals of talk therapy are to: ? Find healthy activities and ways for you to cope with stress. ? Identify and avoid the things that trigger your alcohol use. ? Help you learn how to handle cravings.  Medicines.Medicines can help treat alcohol use disorder by: ? Decreasing alcohol cravings. ? Decreasing the positive feeling you have when you drink alcohol. ? Causing an uncomfortable physical reaction when you drink alcohol (aversion therapy).  Support groups. Support groups are led by people who have quit drinking. They provide emotional support, advice, and guidance.  These forms of treatment are often combined. Some people with this condition benefit from a combination of treatments provided by specialized substance use treatment centers. Follow these instructions at home:  Take over-the-counter and prescription medicines only as told by your health care provider.  Check with your health care provider before starting any new medicines.  Ask friends and family members not to offer you alcohol.  Avoid situations where alcohol is served, including gatherings where others are drinking alcohol.  Create a plan for what to do when you are tempted to use alcohol.  Find hobbies or activities that you enjoy that do not include alcohol.  Keep all follow-up visits as told by your health care provider. This is important. How is this prevented?  If you drink, limit alcohol intake to no more than 1 drink a day for nonpregnant women and 2 drinks a day for men. One drink equals 12 oz of beer, 5 oz of wine, or 1 oz of hard liquor.  If you have a mental health condition, get treatment and support.  Do not give alcohol to  adolescents.  If you are an adolescent: ? Do not drink alcohol. ? Do not be afraid to say no if someone offers you alcohol. Speak up about why you do not want to drink. You can be a positive role model for your friends and set a good example for those around you by not drinking alcohol. ? If your friends drink, spend time with others who do not drink alcohol. Make new friends who do not use alcohol. ? Find healthy ways to manage stress and emotions, such as meditation or deep breathing, exercise, spending time in nature, listening to music, or talking with a trusted friend or family member. Contact a health care provider if:  You are not able to take your medicines as told.  Your symptoms get worse.  You return to drinking alcohol (relapse) and your symptoms get worse. Get help right away if:  You have thoughts about hurting yourself or others. If you ever feel like you may hurt yourself or others, or have thoughts about taking your own life, get help right away. You can go to your nearest emergency department or call:  Your local emergency services (911 in the [place]  A suicide crisis helpline, such as the National Suicide Prevention Lifeline at 1-800-273-8255. This is open 24 hours a day.  Summary  Alcohol use disorder is when your   drinking disrupts your daily life. When you have this condition, you drink too much alcohol and you cannot control your drinking.  Treatment may include detoxification, counseling, medicine, and support groups.  Ask friends and family members not to offer you alcohol. Avoid situations where alcohol is served.  Get help right away if you have thoughts about hurting yourself or others. This information is not intended to replace advice given to you by your health care provider. Make sure you discuss any questions you have with your health care provider. Document Released: 06/22/2004 Document Revised: 02/10/2016 Document Reviewed: 02/10/2016 Elsevier  Interactive Patient Education  2018 [employer]. Diabetes Mellitus and Nutrition When you have diabetes (diabetes mellitus), it is very important to have healthy eating habits because your blood sugar (glucose) levels are greatly affected by what you eat and drink. Eating healthy foods in the appropriate amounts, at about the same times every day, can help you:  Control your blood glucose.  Lower your risk of heart disease.  Improve your blood pressure.  Reach or maintain a healthy weight.  Every person with diabetes is different, and each person has different needs for a meal plan. Your health care provider may recommend that you work with a diet and nutrition specialist (dietitian) to make a meal plan that is best for you. Your meal plan may vary depending on factors such as:  The calories you need.  The medicines you take.  Your weight.  Your blood glucose, blood pressure, and cholesterol levels.  Your activity level.  Other health conditions you have, such as heart or kidney disease.  How do carbohydrates affect me? Carbohydrates affect your blood glucose level more than any other type of food. Eating carbohydrates naturally increases the amount of glucose in your blood. Carbohydrate counting is a method for keeping track of how many carbohydrates you eat. Counting carbohydrates is important to keep your blood glucose at a healthy level, especially if you use insulin or take certain oral diabetes medicines. It is important to know how many carbohydrates you can safely have in each meal. This is different for every person. Your dietitian can help you calculate how many carbohydrates you should have at each meal and for snack. Foods that contain carbohydrates include:  Bread, cereal, rice, pasta, and crackers.  Potatoes and corn.  Peas, beans, and lentils.  Milk and yogurt.  Fruit and juice.  Desserts, such as cakes, cookies, ice cream, and candy.  How does alcohol  affect me? Alcohol can cause a sudden decrease in blood glucose (hypoglycemia), especially if you use insulin or take certain oral diabetes medicines. Hypoglycemia can be a life-threatening condition. Symptoms of hypoglycemia (sleepiness, dizziness, and confusion) are similar to symptoms of having too much alcohol. If your health care provider says that alcohol is safe for you, follow these guidelines:  Limit alcohol intake to no more than 1 drink per day for nonpregnant women and 2 drinks per day for men. One drink equals 12 oz of beer, 5 oz of wine, or 1 oz of hard liquor.  Do not drink on an empty stomach.  Keep yourself hydrated with water, diet soda, or unsweetened iced tea.  Keep in mind that regular soda, juice, and other mixers may contain a lot of sugar and must be counted as carbohydrates.  What are tips for following this plan? Reading food labels  Start by checking the serving size on the label. The amount of calories, carbohydrates, fats, and other nutrients listed on  the label are based on one serving of the food. Many foods contain more than one serving per package.  Check the total grams (g) of carbohydrates in one serving. You can calculate the number of servings of carbohydrates in one serving by dividing the total carbohydrates by 15. For example, if a food has 30 g of total carbohydrates, it would be equal to 2 servings of carbohydrates.  Check the number of grams (g) of saturated and trans fats in one serving. Choose foods that have low or no amount of these fats.  Check the number of milligrams (mg) of sodium in one serving. Most people should limit total sodium intake to less than 2,300 mg per day.  Always check the nutrition information of foods labeled as "low-fat" or "nonfat". These foods may be higher in added sugar or refined carbohydrates and should be avoided.  Talk to your dietitian to identify your daily goals for nutrients listed on the  label. Shopping  Avoid buying canned, premade, or processed foods. These foods tend to be high in fat, sodium, and added sugar.  Shop around the outside edge of the grocery store. This includes fresh fruits and vegetables, bulk grains, fresh meats, and fresh dairy. Cooking  Use low-heat cooking methods, such as baking, instead of high-heat cooking methods like deep frying.  Cook using healthy oils, such as olive, canola, or sunflower oil.  Avoid cooking with butter, cream, or high-fat meats. Meal planning  Eat meals and snacks regularly, preferably at the same times every day. Avoid going long periods of time without eating.  Eat foods high in fiber, such as fresh fruits, vegetables, beans, and whole grains. Talk to your dietitian about how many servings of carbohydrates you can eat at each meal.  Eat 4-6 ounces of lean protein each day, such as lean meat, chicken, fish, eggs, or tofu. 1 ounce is equal to 1 ounce of meat, chicken, or fish, 1 egg, or 1/4 cup of tofu.  Eat some foods each day that contain healthy fats, such as avocado, nuts, seeds, and fish. Lifestyle   Check your blood glucose regularly.  Exercise at least 30 minutes 5 or more days each week, or as told by your health care provider.  Take medicines as told by your health care provider.  Do not use any products that contain nicotine or tobacco, such as cigarettes and e-cigarettes. If you need help quitting, ask your health care provider.  Work with a Veterinary surgeoncounselor or diabetes educator to identify strategies to manage stress and any emotional and social challenges. What are some questions to ask my health care provider?  Do I need to meet with a diabetes educator?  Do I need to meet with a dietitian?  What number can I call if I have questions?  When are the best times to check my blood glucose? Where to find more information:  American Diabetes Association: diabetes.org/food-and-fitness/food  Academy of  Nutrition and Dietetics: https://www.vargas.com/www.eatright.org/resources/health/diseases-and-conditions/diabetes  [employer] Institute of Diabetes and Digestive and Kidney Diseases (NIH): FindJewelers.czwww.niddk.nih.gov/health-information/diabetes/overview/diet-eating-physical-activity Summary  A healthy meal plan will help you control your blood glucose and maintain a healthy lifestyle.  Working with a diet and nutrition specialist (dietitian) can help you make a meal plan that is best for you.  Keep in mind that carbohydrates and alcohol have immediate effects on your blood glucose levels. It is important to count carbohydrates and to use alcohol carefully. This information is not intended to replace advice given to you by your health  care provider. Make sure you discuss any questions you have with your health care provider. Document Released: 02/09/2005 Document Revised: 06/19/2016 Document Reviewed: 06/19/2016 Elsevier Interactive Patient Education  [employer].

## 2017-09-06 NOTE — Progress Notes (Signed)
Patient ID: Kevin Villa, male    DOB: [birth year], 62 y.o.   MRN: [id number]  Chief Complaint  Patient presents with  . Follow-up    Allergies Peanut-containing drug products and Tomato  Subjective:   Kevin Villa is a 62 y.o. male who presents to [facility] today.  HPI Mr. Trethewey presents for follow-up visit today for his blood pressure.  He does report that it is been elevated several other doctors visits.  He has a history of elevated blood pressure but is never had a diagnosis of hypertension.  He reports he has never been placed on any medications for blood pressure control.  He denies any chest pain, shortness of breath, or swelling in his extremities.  He does not take an aspirin on a daily basis.  He does not know if he has a diagnosis of high cholesterol.  He does report that he is also here to go over his blood sugars.  Reports he has an upcoming appointment with an orthopedic surgeon.  He reports he was seen by one surgeon and then referred to a different one.  He does report that he is still drinking alcohol.  He drinks several cases of beer a week.  He denies any drug use or cigarette use. Patient reports that for breakfast he eats 4 pieces of bread and eggs.  He often goes out to lunch and eats burgers and fries.  He usually drinks sweet tea.  He does not eat many fruits or vegetables.  He does report that his diet is restricted due to his lack of teeth.  He reports he needs to eat soft foods.  He has a family history of diabetes.     Past Medical History:  Diagnosis Date  . Asthma     Past Surgical History:  Procedure Laterality Date  . HERNIA REPAIR      Family History  Problem Relation Age of Onset  . Diabetes Mother   . Hypertension Mother   . Cancer Brother      Social History   Socioeconomic History  . Marital status: Legally Separated    Spouse name: Not on file  . Number of children: 0  . Years of education: 11 grade  .  Highest education level: 11th grade  Occupational History  . Occupation: disability  Social Needs  . Financial resource strain: Not on file  . Food insecurity:    Worry: Never true    Inability: Never true  . Transportation needs:    Medical: No    Non-medical: No  Tobacco Use  . Smoking status: Former Smoker    Packs/day: 0.25    Years: 30.00    Pack years: 7.50    Types: Cigarettes    Last attempt to quit: 2004    Years since quitting: 15.2  . Smokeless tobacco: Never Used  Substance and Sexual Activity  . Alcohol use: Yes    Alcohol/week: 0.6 oz    Types: 1 Cans of beer per week    Comment: Occ  . Drug use: No  . Sexual activity: Never    Partners: Female  Lifestyle  . Physical activity:    Days per week: 0 days    Minutes per session: 0 min  . Stress: Only a little  Relationships  . Social connections:    Talks on phone: More than three times a week    Gets together: More than three times a week  Attends religious service: More than 4 times per year    Active member of club or organization: No    Attends meetings of clubs or organizations: Never    Relationship status: Divorced  Other Topics Concern  . Not on file  Social History Narrative   [place], [place] disability for hip pain.   Divorced, not dating.   No children.   Has friends and family.   Lives with mother or lives in his camper.   No pets.   Drinks about a 12 pack a weekend. Does not drink on a daily basis.    Does not drive b/c they expired and did not pay to get them renewed.    4 brothers and 4 sisters.       No current outpatient medications on file prior to visit.   No current facility-administered medications on file prior to visit.     Review of Systems  Constitutional: Negative for appetite change, chills, fever and unexpected weight change.  HENT: Negative for sinus pressure, trouble swallowing and voice change.   Eyes: Negative for visual disturbance.  Respiratory:  Negative for cough, chest tightness, shortness of breath and wheezing.   Cardiovascular: Negative for chest pain, palpitations and leg swelling.  Gastrointestinal: Negative for abdominal pain, diarrhea, nausea and vomiting.  Genitourinary: Negative for decreased urine volume, dysuria and frequency.  Skin: Negative for rash.  Neurological: Negative for dizziness, tremors, syncope, facial asymmetry, weakness and headaches.  Hematological: Negative for adenopathy. Does not bruise/bleed easily.  Psychiatric/Behavioral: Negative for behavioral problems, dysphoric mood, hallucinations, self-injury and suicidal ideas.     Objective:   BP (!) 160/90 (BP Location: Left Arm, Patient Position: Sitting, Cuff Size: Normal)   Pulse 86   Temp 98.3 F (36.8 C) (Temporal)   Resp 16   Ht 5\' 10"  (1.778 m)   Wt 208 lb 12 oz (94.7 kg)   SpO2 97%   BMI 29.95 kg/m   Physical Exam  Constitutional: He is oriented to person, place, and time. He appears well-developed and well-nourished.  HENT:  Head: Normocephalic and atraumatic.  Eyes: Pupils are equal, round, and reactive to light. EOM are normal.  Neck: Normal range of motion. Neck supple. No thyromegaly present.  Cardiovascular: Normal rate, regular rhythm and normal heart sounds.  Pulmonary/Chest: Effort normal and breath sounds normal.  Musculoskeletal: He exhibits no edema.  Neurological: He is alert and oriented to person, place, and time. No cranial nerve deficit.  Skin: Skin is warm, dry and intact. Capillary refill takes less than 2 seconds.  Psychiatric: He has a normal mood and affect. His behavior is normal. Thought content normal.  Vitals reviewed.    Assessment and Plan  1. HTN, goal below 140/90 Lifestyle modifications discussed with patient including a diet emphasizing vegetables, fruits, and whole grains. Limiting intake of sodium to less than 2,400 mg per day.  Recommendations discussed include consuming low-fat dairy products,  poultry, fish, legumes, non-tropical vegetable oils, and nuts; and limiting intake of sweets, sugar-sweetened beverages, and red meat. Discussed following a plan such as the Dietary Approaches to Stop Hypertension (DASH) diet. Patient to read up on this diet.  Start norvasc qd. Patient counseled in detail regarding the risks of medication. Told to call or return to clinic if develop any worrisome signs or symptoms. Patient voiced understanding.  Check FLP due to risks.  - Lipid panel - amLODipine (NORVASC) 5 MG tablet; Take 1 tablet (5 mg total) by mouth daily.  Dispense: 90 tablet; Refill: 3  2. Prediabetes Counseling given today regarding the diagnosis of prediabetes and its implications on his health.  We discussed that if he did not make changes in his diet and exercise habits that he is at increased risk of becoming diabetic.  We discussed the fact that diabetes increases risk of heart attack, stroke, kidney problems, and many other health problems.  Patient was given handouts and education in the office today regarding changes to make in his diet.  He eats a very carbohydrate laden diet and was not aware that this would have an effect on his blood sugar.  We will plan on rechecking his sugars in 3-6 months.  He is agreeable to making an attempt to changing his diet.  3. Overweight (BMI 25.0-29.9) Lifestyle modifications discussed with patient including a diet emphasizing vegetables, fruits, and whole grains. Limiting intake of sodium to less than 2,400 mg per day.  The patient is asked to make an attempt to improve diet and exercise patterns to aid in medical management of this problem.   4. High risk medication use - Hepatic function panel  5. Alcohol abuse Patient does not wish to receive any assistance regarding his alcohol intake.  He reports that he knows that he needs to cut down on his alcohol use.  He was counseled regarding how alcohol affects his blood sugar and blood pressure. OV  was 25 minutes. Greater than 50% spent counseling patient on the above topics.   Return in about 1 month (around 10/04/2017) for BP check. Aliene Beams, MD 09/06/2017

## 2017-09-20 LAB — LIPID PANEL
Cholesterol: 197 mg/dL (ref <200)
HDL: 76 mg/dL (ref >40)
LDL Cholesterol (Calc): 105 mg/dL (calc) — ABNORMAL HIGH
Non-HDL Cholesterol (Calc): 121 mg/dL (calc) (ref <130)
Total CHOL/HDL Ratio: 2.6 (calc) (ref <5.0)
Triglycerides: 72 mg/dL (ref <150)

## 2017-09-20 LAB — HEPATIC FUNCTION PANEL
AG Ratio: 1.6 (calc) (ref 1.0–2.5)
ALT: 20 U/L (ref 9–46)
AST: 26 U/L (ref 10–35)
Albumin: 4.5 g/dL (ref 3.6–5.1)
Alkaline phosphatase (APISO): 65 U/L (ref 40–115)
Bilirubin, Direct: 0.1 mg/dL (ref 0.0–0.2)
Globulin: 2.9 g/dL (calc) (ref 1.9–3.7)
Indirect Bilirubin: 0.4 mg/dL (calc) (ref 0.2–1.2)
Total Bilirubin: 0.5 mg/dL (ref 0.2–1.2)
Total Protein: 7.4 g/dL (ref 6.1–8.1)

## 2017-10-10 ENCOUNTER — Other Ambulatory Visit: Payer: Self-pay

## 2017-10-10 ENCOUNTER — Encounter: Payer: Self-pay | Admitting: Family Medicine

## 2017-10-10 ENCOUNTER — Ambulatory Visit (INDEPENDENT_AMBULATORY_CARE_PROVIDER_SITE_OTHER): Payer: Medicaid Other | Admitting: Family Medicine

## 2017-10-10 VITALS — BP 138/78 | HR 85 | Temp 98.6°F | Resp 20 | Ht 71.0 in | Wt 214.0 lb

## 2017-10-10 DIAGNOSIS — E663 Overweight: Secondary | ICD-10-CM

## 2017-10-10 DIAGNOSIS — R7303 Prediabetes: Secondary | ICD-10-CM | POA: Diagnosis not present

## 2017-10-10 DIAGNOSIS — I10 Essential (primary) hypertension: Secondary | ICD-10-CM | POA: Diagnosis not present

## 2017-10-10 DIAGNOSIS — Z01818 Encounter for other preprocedural examination: Secondary | ICD-10-CM | POA: Diagnosis not present

## 2017-10-10 NOTE — Progress Notes (Signed)
Patient ID: Kevin Villa, male    DOB: [birth year], 62 y.o.   MRN: [id number]  Chief Complaint  Patient presents with  . Hypertension    1 month BP check    Allergies Peanut-containing drug products and Tomato  Subjective:   Kevin Villa is a 62 y.o. male who presents to [facility] today.  HPI Kevin Villa presents today to follow-up on his blood pressure and to get clearance for upcoming right total hip replacement.  This is scheduled for November 06, 2017 by Dr. Durene Romans.  He brings his paperwork in today requesting surgical clearance.  Kevin Villa reports that he has been doing very well.  He has not had any side effects with his blood pressure medication.  He reports that he is cut down on his alcohol use and is been only drinking 1-2 beers a day for the past month.  He denies any chest pain, shortness of breath, swelling in his extremities, or palpitations.  He does not smoke or do illicit drugs.  He denies any prior cardiac history.  He has no history of heart attack or stroke.  His family history is significant for diabetes and high blood pressure in his mother although she has not had any cardiac disease to his knowledge.  His past surgical history is significant for hernia repair.  He reports that he did have some asthma as a child but other than that has not had any issues with his breathing.  He denies any cough.  He reports that he feels very well.  He does not get short of breath with exertion.  He has no history of seizures or alcohol withdrawal.   Past Medical History:  Diagnosis Date  . Asthma     Past Surgical History:  Procedure Laterality Date  . HERNIA REPAIR      Family History  Problem Relation Age of Onset  . Diabetes Mother   . Hypertension Mother   . Cancer Brother      Social History   Socioeconomic History  . Marital status: Legally Separated    Spouse name: Not on file  . Number of children: 0  . Years of  education: 11 grade  . Highest education level: 11th grade  Occupational History  . Occupation: disability  Social Needs  . Financial resource strain: Not on file  . Food insecurity:    Worry: Never true    Inability: Never true  . Transportation needs:    Medical: No    Non-medical: No  Tobacco Use  . Smoking status: Former Smoker    Packs/day: 0.25    Years: 30.00    Pack years: 7.50    Types: Cigarettes    Last attempt to quit: 2004    Years since quitting: 15.3  . Smokeless tobacco: Never Used  Substance and Sexual Activity  . Alcohol use: Yes    Alcohol/week: 0.6 oz    Types: 1 Cans of beer per week    Comment: Occ  . Drug use: No  . Sexual activity: Never    Partners: Female  Lifestyle  . Physical activity:    Days per week: 0 days    Minutes per session: 0 min  . Stress: Only a little  Relationships  . Social connections:    Talks on phone: More than three times a week    Gets together: More than three times a week    Attends religious service: More than  4 times per year    Active member of club or organization: No    Attends meetings of clubs or organizations: Never    Relationship status: Divorced  Other Topics Concern  . Not on file  Social History Narrative   Live in [place], [place]. On disability for hip pain.   Divorced, not dating.   No children.   Has friends and family.   Lives with mother or lives in his camper.   No pets.   Drinks about a 12 pack a weekend. Does not drink on a daily basis.    Does not drive b/c they expired and did not pay to get them renewed.    4 brothers and 4 sisters.       Current Outpatient Medications on File Prior to Visit  Medication Sig Dispense Refill  . amLODipine (NORVASC) 5 MG tablet Take 1 tablet (5 mg total) by mouth daily. 90 tablet 3   No current facility-administered medications on file prior to visit.     Review of Systems  Constitutional: Negative for appetite change, chills, fever and unexpected weight  change.  HENT: Negative for trouble swallowing and voice change.   Eyes: Negative for visual disturbance.  Respiratory: Negative for cough, chest tightness, shortness of breath and wheezing.   Cardiovascular: Negative for chest pain, palpitations and leg swelling.  Gastrointestinal: Negative for abdominal pain, diarrhea, nausea and vomiting.  Genitourinary: Negative for decreased urine volume, dysuria and frequency.  Musculoskeletal: Positive for arthralgias and gait problem.       Has right hip pain.  Skin: Negative for rash.  Neurological: Negative for dizziness, tremors, seizures, syncope, facial asymmetry, speech difficulty, weakness, light-headedness, numbness and headaches.  Hematological: Negative for adenopathy. Does not bruise/bleed easily.  Psychiatric/Behavioral: Negative for dysphoric mood. The patient is not nervous/anxious.      Objective:   BP 138/78 (BP Location: Left Arm, Patient Position: Sitting, Cuff Size: Large)   Pulse 85   Temp 98.6 F (37 C) (Oral)   Resp 20   Ht  (1.803 m)   Wt 214 lb (97.1 kg)   SpO2 97%   BMI 29.85 kg/m   Physical Exam  Constitutional: He is oriented to person, place, and time. He appears well-developed and well-nourished.  HENT:  Head: Normocephalic and atraumatic.  Mouth/Throat: Oropharynx is clear and moist.  Lacks all of his teeth.  Eyes: Pupils are equal, round, and reactive to light. EOM are normal. No scleral icterus.  Neck: Normal range of motion. Neck supple. No JVD present. No thyromegaly present.  Cardiovascular: Normal rate, regular rhythm and normal heart sounds.  Pulmonary/Chest: Effort normal and breath sounds normal.  Abdominal: Soft. Bowel sounds are normal.  Musculoskeletal: He exhibits no edema.  Lymphadenopathy:    He has no cervical adenopathy.  Neurological: He is alert and oriented to person, place, and time. No cranial nerve deficit. Coordination normal.  Skin: Skin is warm, dry and intact. Capillary  refill takes less than 2 seconds.  Psychiatric: He has a normal mood and affect. His behavior is normal. Judgment and thought content normal.  Vitals reviewed.  EKG performed in office which reveals normal sinus rhythm with no ST segment/T wave inversion.  Assessment and Plan  1. Preoperative clearance Preoperative clearance was performed today.  His EKG is within normal limits.  Will obtain chest x-Kevin Villa.  His labs which were performed less than 1 month ago were reviewed today.  He has normal electrolytes and kidney function.  Will await CBC and chest x-Kevin Villa prior to sending in clearance. - CBC with Differential/Platelet - DG Chest 2 View; Future  2. HTN, goal below 140/90 Well-controlled at this time.  Continue amlodipine p.o. daily. Lifestyle modifications discussed with patient including a diet emphasizing vegetables, fruits, and whole grains. Limiting intake of sodium to less than 2,400 mg per day.  Recommendations discussed include consuming low-fat dairy products, poultry, fish, legumes, non-tropical vegetable oils, and nuts; and limiting intake of sweets, sugar-sweetened beverages, and red meat. Discussed following a plan such as the Dietary Approaches to Stop Hypertension (DASH) diet. Patient to read up on this diet.    3. Prediabetes Diet and lifestyle modifications discussed with patient.  We discussed the implications of an elevated hemoglobin A1c.  We will plan to recheck again in 3 to 6 months.  He has cut down on his alcohol intake and is trying to work on his diet.  He was congratulated on his efforts.  4. Overweight (BMI 25.0-29.9) BMI and correlation with diabetes was discussed today.  Return in about 3 months (around 01/10/2018). Aliene Beams, MD 10/10/2017

## 2017-10-11 ENCOUNTER — Ambulatory Visit (HOSPITAL_COMMUNITY)
Admission: RE | Admit: 2017-10-11 | Discharge: 2017-10-11 | Disposition: A | Discharge status: Home / Self Care | Payer: Medicaid Other | Source: Ambulatory Visit | Attending: Family Medicine | Admitting: Family Medicine

## 2017-10-11 DIAGNOSIS — Z01818 Encounter for other preprocedural examination: Secondary | ICD-10-CM | POA: Insufficient documentation

## 2017-10-11 LAB — CBC WITH DIFFERENTIAL/PLATELET
Basophils Absolute: 41 cells/uL (ref 0–200)
Basophils Relative: 0.7 %
Eosinophils Absolute: 159 cells/uL (ref 15–500)
Eosinophils Relative: 2.7 %
HCT: 40.2 % (ref 38.5–50.0)
Hemoglobin: 13.5 g/dL (ref 13.2–17.1)
Lymphs Abs: 2171 cells/uL (ref 850–3900)
MCH: 27.2 pg (ref 27.0–33.0)
MCHC: 33.6 g/dL (ref 32.0–36.0)
MCV: 80.9 fL (ref 80.0–100.0)
MPV: 11.9 fL (ref 7.5–12.5)
Monocytes Relative: 7.5 %
Neutro Abs: 3086 cells/uL (ref 1500–7800)
Neutrophils Relative %: 52.3 %
Platelets: 186 10*3/uL (ref 140–400)
RBC: 4.97 10*6/uL (ref 4.20–5.80)
RDW: 14.5 % (ref 11.0–15.0)
Total Lymphocyte: 36.8 %
WBC mixed population: 443 cells/uL (ref 200–950)
WBC: 5.9 10*3/uL (ref 3.8–10.8)

## 2017-10-11 IMAGING — DX DG CHEST 2V
2 series · 2 of 2 positions shown · non-contrast
Comparison: [DATE]

CLINICAL DATA: Preop hip replacement.  Hypertension

EXAM:
CHEST - 2 VIEW

[chest pa]
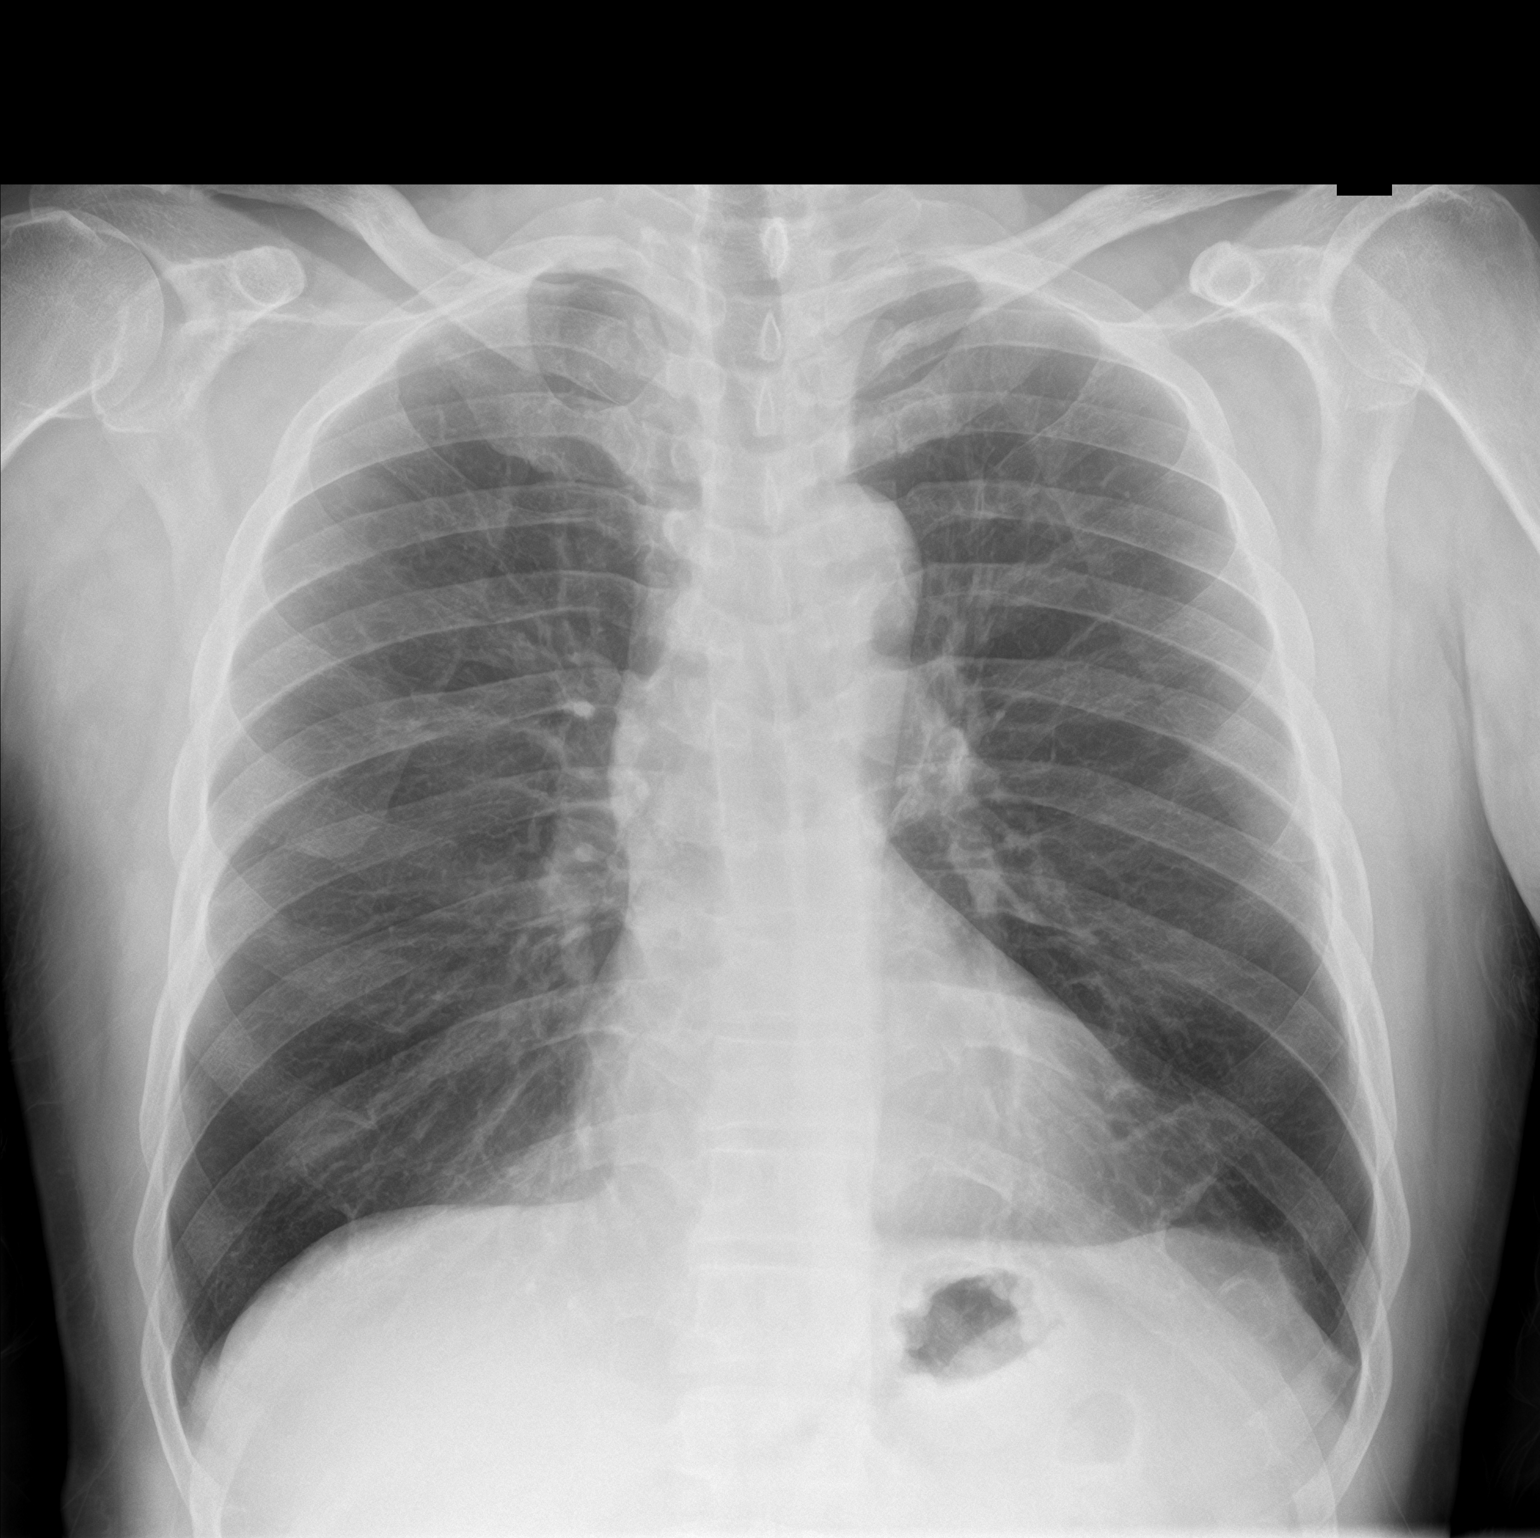

[chest lat]
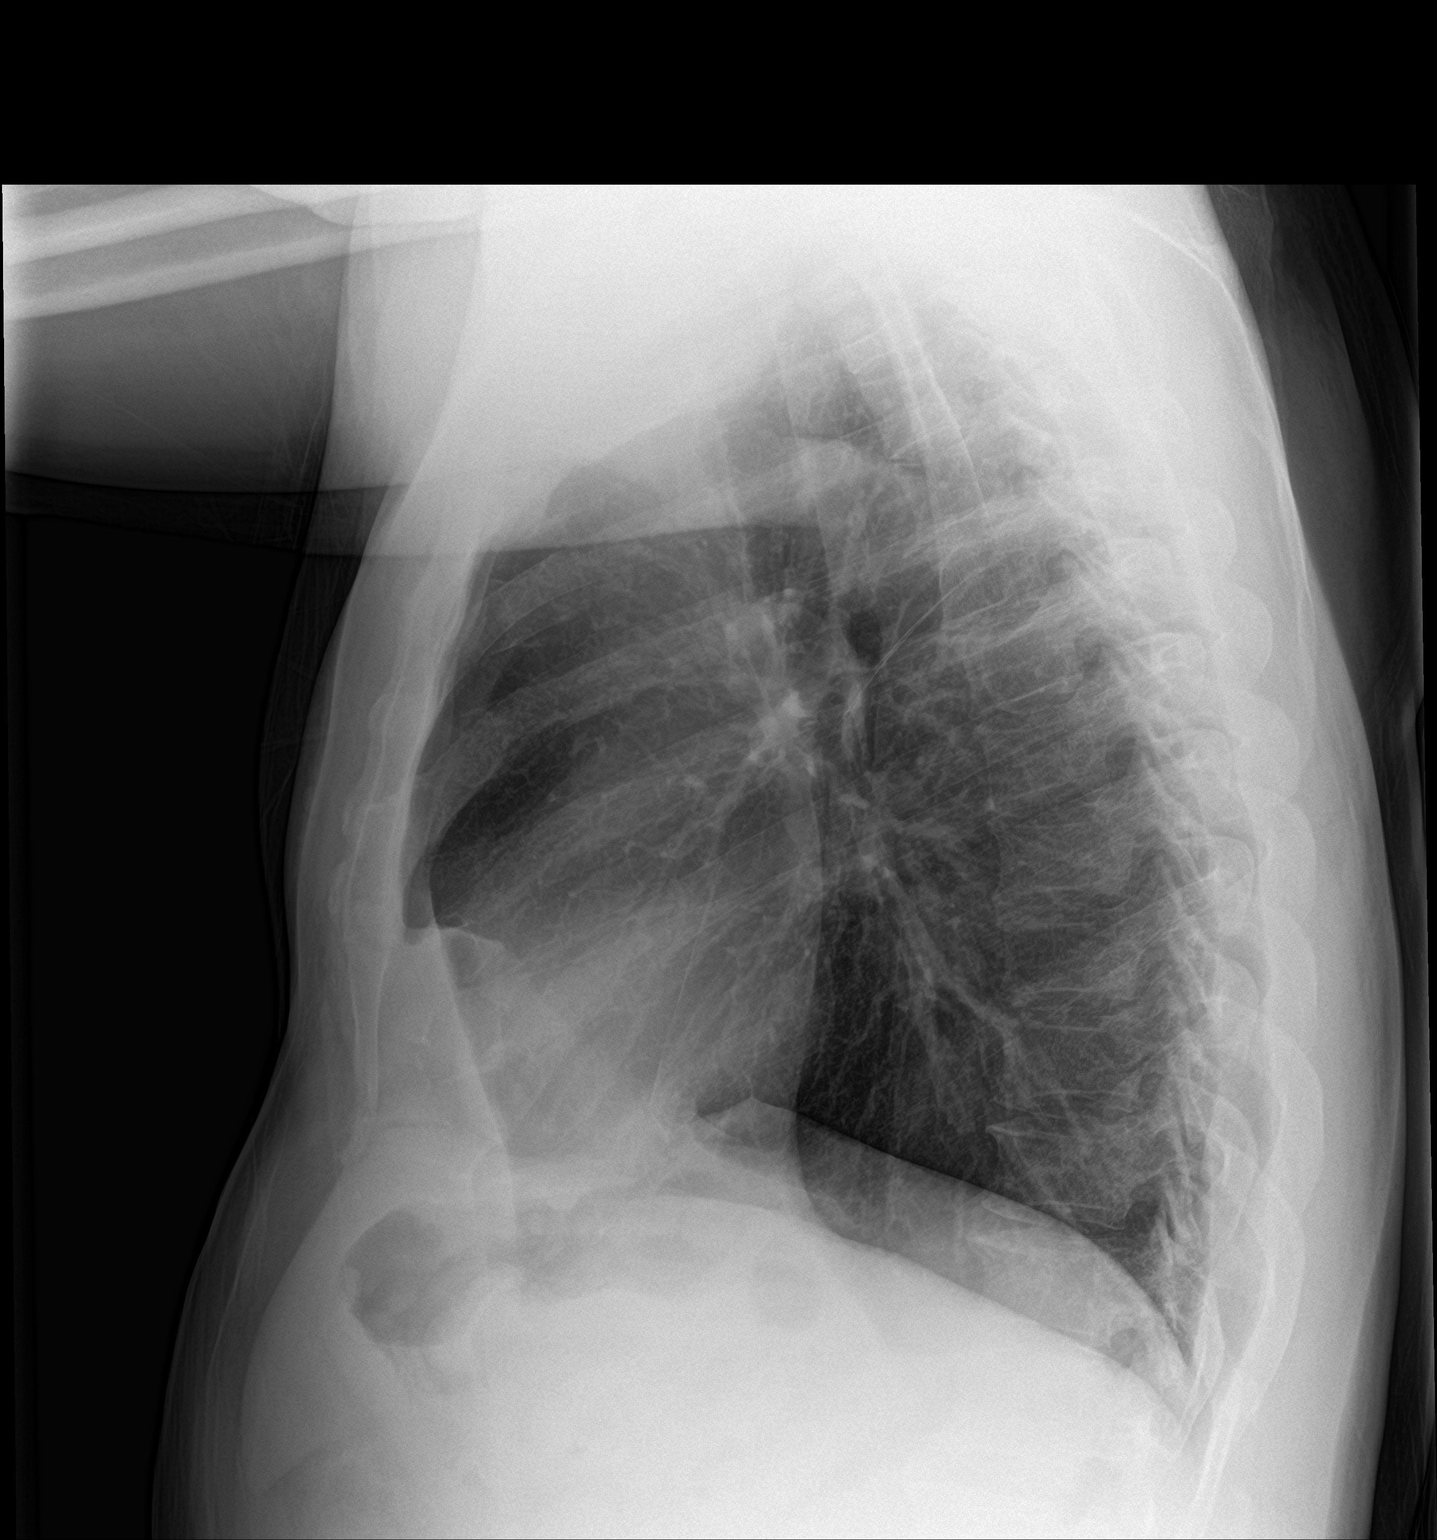

[2 of 2 positions shown; findings below may reference images not displayed]

FINDINGS: Heart and mediastinal contours are within normal limits. No focal
opacities or effusions. No acute bony abnormality.
IMPRESSION: No active cardiopulmonary disease.

## 2017-10-24 NOTE — H&P (Signed)
TOTAL HIP ADMISSION H&P  Patient is admitted for right total hip arthroplasty, anterior approach.  Subjective:  Chief Complaint:   Right hip primary OA / pain  HPI: Kevin Villa, 62 y.o. male, has a history of pain and functional disability in the right hip(s) due to arthritis and patient has failed non-surgical conservative treatments for greater than 12 weeks to include NSAID's and/or analgesics, use of assistive devices and activity modification.  Onset of symptoms was gradual starting ~2 years ago with gradually worsening course since that time.The patient noted no past surgery on the right hip(s).  Patient currently rates pain in the right hip at 10 out of 10 with activity. Patient has night pain, worsening of pain with activity and weight bearing, trendelenberg gait, pain that interfers with activities of daily living and pain with passive range of motion. Patient has evidence of periarticular osteophytes and joint space narrowing by imaging studies. This condition presents safety issues increasing the risk of falls.  There is no current active infection.  Risks, benefits and expectations were discussed with the patient.  Risks including but not limited to the risk of anesthesia, blood clots, nerve damage, blood vessel damage, failure of the prosthesis, infection and up to and including death.  Patient understand the risks, benefits and expectations and wishes to proceed with surgery.   PCP: Aliene Beams, MD  D/C Plans:       Home  Post-op Meds:       No Rx given   Tranexamic Acid:      To be given - IV    Decadron:      Is to be given  FYI:     ASA  Norco  DME:   Rx given for - RW   PT:   No PT    Patient Active Problem List   Diagnosis Date Noted  . Prediabetes 09/06/2017  . HTN, goal below 140/90 09/06/2017  . Alcohol abuse 09/06/2017  . High risk medication use 09/06/2017  . Overweight (BMI 25.0-29.9) 09/06/2017   Past Medical History:  Diagnosis Date  . Asthma      Past Surgical History:  Procedure Laterality Date  . HERNIA REPAIR      No current facility-administered medications for this encounter.    Current Outpatient Medications  Medication Sig Dispense Refill Last Dose  . amLODipine (NORVASC) 5 MG tablet Take 1 tablet (5 mg total) by mouth daily. 90 tablet 3 Taking   Allergies  Allergen Reactions  . Peanut-Containing Drug Products Other (See Comments)    And also in food. Mouth raw  . Tomato Hives and Itching    Social History   Tobacco Use  . Smoking status: Former Smoker    Packs/day: 0.25    Years: 30.00    Pack years: 7.50    Types: Cigarettes    Last attempt to quit: 2004    Years since quitting: 15.4  . Smokeless tobacco: Never Used  Substance Use Topics  . Alcohol use: Yes    Alcohol/week: 0.6 oz    Types: 1 Cans of beer per week    Comment: Occ    Family History  Problem Relation Age of Onset  . Diabetes Mother   . Hypertension Mother   . Cancer Brother      Review of Systems  Constitutional: Negative.   HENT: Negative.   Eyes: Negative.   Respiratory: Negative.   Cardiovascular: Negative.   Gastrointestinal: Negative.   Genitourinary: Negative.  Musculoskeletal: Positive for joint pain.  Skin: Negative.   Neurological: Negative.   Endo/Heme/Allergies: Negative.   Psychiatric/Behavioral: Negative.     Objective:  Physical Exam  Constitutional: He is oriented to person, place, and time. He appears well-developed.  HENT:  Head: Normocephalic.  Eyes: Pupils are equal, round, and reactive to light.  Neck: Neck supple. No JVD present. No tracheal deviation present. No thyromegaly present.  Cardiovascular: Normal rate, regular rhythm and intact distal pulses.  Respiratory: Effort normal and breath sounds normal. No respiratory distress. He has no wheezes.  GI: Soft. There is no tenderness. There is no guarding.  Musculoskeletal:       Right hip: He exhibits decreased range of motion, decreased  strength, tenderness and bony tenderness. He exhibits no swelling, no deformity and no laceration.  Lymphadenopathy:    He has no cervical adenopathy.  Neurological: He is alert and oriented to person, place, and time.  Skin: Skin is warm and dry.  Psychiatric: He has a normal mood and affect.      Labs:  Estimated body mass index is 29.85 kg/m as calculated from the following:   Height as of 10/10/17:  (1.803 m).   Weight as of 10/10/17: 97.1 kg (214 lb).   Imaging Review Plain radiographs demonstrate severe degenerative joint disease of the right hip(s). The bone quality appears to be good for age and reported activity level.    Preoperative templating of the joint replacement has been completed, documented, and submitted to the Operating Room personnel in order to optimize intra-operative equipment management.     Assessment/Plan:  End stage arthritis, right hip  The patient history, physical examination, clinical judgement of the provider and imaging studies are consistent with end stage degenerative joint disease of the right hip and total hip arthroplasty is deemed medically necessary. The treatment options including medical management, injection therapy, arthroscopy and arthroplasty were discussed at length. The risks and benefits of total hip arthroplasty were presented and reviewed. The risks due to aseptic loosening, infection, stiffness, dislocation/subluxation,  thromboembolic complications and other imponderables were discussed.  The patient acknowledged the explanation, agreed to proceed with the plan and consent was signed. Patient is being admitted for inpatient treatment for surgery, pain control, PT, OT, prophylactic antibiotics, VTE prophylaxis, progressive ambulation and ADL's and discharge planning.The patient is planning to be discharged home.     Anastasio Auerbach Tambra Muller   PA-C  10/24/2017, 1:37 PM

## 2017-10-31 ENCOUNTER — Other Ambulatory Visit (HOSPITAL_COMMUNITY): Payer: Self-pay | Admitting: Emergency Medicine

## 2017-10-31 NOTE — Progress Notes (Signed)
ekg 10-10-17 epic   cxr 10-11-17 epic   Cbcdiff, hepatic panel 10-11-17 epic

## 2017-10-31 NOTE — Patient Instructions (Addendum)
Kevin Villa  10/31/2017   Your procedure is scheduled on: 11-06-17   Report to [facility] Main  Entrance    Report to admitting at 8:45AM     Call this number if you have problems the morning of surgery [phone]     Remember: Do not eat food or drink liquids :After Midnight.     Take these medicines the morning of surgery with A SIP OF WATER: amlodipine                                 You may not have any metal on your body including hair pins and              piercings  Do not wear jewelry, make-up, lotions, powders or perfumes, deodorant              Men may shave face and neck.   Do not bring valuables to the hospital. [facility] IS NOT             RESPONSIBLE   FOR VALUABLES.  Contacts, dentures or bridgework may not be worn into surgery.  Leave suitcase in the car. After surgery it may be brought to your room.                 Please read over the following fact sheets you were given: _____________________________________________________________________             [facility] - Preparing for Surgery Before surgery, you can play an important role.  Because skin is not sterile, your skin needs to be as free of germs as possible.  You can reduce the number of germs on your skin by washing with CHG (chlorahexidine gluconate) soap before surgery.  CHG is an antiseptic cleaner which kills germs and bonds with the skin to continue killing germs even after washing. Please DO NOT use if you have an allergy to CHG or antibacterial soaps.  If your skin becomes reddened/irritated stop using the CHG and inform your nurse when you arrive at Short Stay. Do not shave (including legs and underarms) for at least 48 hours prior to the first CHG shower.  You may shave your face/neck. Please follow these instructions carefully:  1.  Shower with CHG Soap the night before surgery and the  morning of Surgery.  2.  If you choose to wash your hair, wash your  hair first as usual with your  normal  shampoo.  3.  After you shampoo, rinse your hair and body thoroughly to remove the  shampoo.                           4.  Use CHG as you would any other liquid soap.  You can apply chg directly  to the skin and wash                       Gently with a scrungie or clean washcloth.  5.  Apply the CHG Soap to your body ONLY FROM THE NECK DOWN.   Do not use on face/ open                           Wound or open sores. Avoid  contact with eyes, ears mouth and genitals (private parts).                       Wash face,  Genitals (private parts) with your normal soap.             6.  Wash thoroughly, paying special attention to the area where your surgery  will be performed.  7.  Thoroughly rinse your body with warm water from the neck down.  8.  DO NOT shower/wash with your normal soap after using and rinsing off  the CHG Soap.                9.  Pat yourself dry with a clean towel.            10.  Wear clean pajamas.            11.  Place clean sheets on your bed the night of your first shower and do not  sleep with pets. Day of Surgery : Do not apply any lotions/deodorants the morning of surgery.  Please wear clean clothes to the hospital/surgery center.  FAILURE TO FOLLOW THESE INSTRUCTIONS MAY RESULT IN THE CANCELLATION OF YOUR SURGERY PATIENT SIGNATURE_________________________________  NURSE SIGNATURE__________________________________  ________________________________________________________________________   Kevin MireIncentive Spirometer  An incentive spirometer is a tool that can help keep your lungs clear and active. This tool measures how well you are filling your lungs with each breath. Taking long deep breaths may help reverse or decrease the chance of developing breathing (pulmonary) problems (especially infection) following:  A long period of time when you are unable to move or be active. BEFORE THE PROCEDURE   If the spirometer includes an indicator  to show your best effort, your nurse or respiratory therapist will set it to a desired goal.  If possible, sit up straight or lean slightly forward. Try not to slouch.  Hold the incentive spirometer in an upright position. INSTRUCTIONS FOR USE  1. Sit on the edge of your bed if possible, or sit up as far as you can in bed or on a chair. 2. Hold the incentive spirometer in an upright position. 3. Breathe out normally. 4. Place the mouthpiece in your mouth and seal your lips tightly around it. 5. Breathe in slowly and as deeply as possible, raising the piston or the ball toward the top of the column. 6. Hold your breath for 3-5 seconds or for as long as possible. Allow the piston or ball to fall to the bottom of the column. 7. Remove the mouthpiece from your mouth and breathe out normally. 8. Rest for a few seconds and repeat Steps 1 through 7 at least 10 times every 1-2 hours when you are awake. Take your time and take a few normal breaths between deep breaths. 9. The spirometer may include an indicator to show your best effort. Use the indicator as a goal to work toward during each repetition. 10. After each set of 10 deep breaths, practice coughing to be sure your lungs are clear. If you have an incision (the cut made at the time of surgery), support your incision when coughing by placing a pillow or rolled up towels firmly against it. Once you are able to get out of bed, walk around indoors and cough well. You may stop using the incentive spirometer when instructed by your caregiver.  RISKS AND COMPLICATIONS  Take your time so you do not get dizzy or light-headed.  If  you are in pain, you may need to take or ask for pain medication before doing incentive spirometry. It is harder to take a deep breath if you are having pain. AFTER USE  Rest and breathe slowly and easily.  It can be helpful to keep track of a log of your progress. Your caregiver can provide you with a simple table to help  with this. If you are using the spirometer at home, follow these instructions: SEEK MEDICAL CARE IF:   You are having difficultly using the spirometer.  You have trouble using the spirometer as often as instructed.  Your pain medication is not giving enough relief while using the spirometer.  You develop fever of 100.5 F (38.1 C) or higher. SEEK IMMEDIATE MEDICAL CARE IF:   You cough up bloody sputum that had not been present before.  You develop fever of 102 F (38.9 C) or greater.  You develop worsening pain at or near the incision site. MAKE SURE YOU:   Understand these instructions.  Will watch your condition.  Will get help right away if you are not doing well or get worse. Document Released: 09/25/2006 Document Revised: 08/07/2011 Document Reviewed: 11/26/2006 Exit[employer] Patient Information 2014 ExitCare, [place].   ________________________________________________________________________  WHAT IS A BLOOD TRANSFUSION? Blood Transfusion Information  A transfusion is the replacement of blood or some of its parts. Blood is made up of multiple cells which provide different functions.  Red blood cells carry oxygen and are used for blood loss replacement.  White blood cells fight against infection.  Platelets control bleeding.  Plasma helps clot blood.  Other blood products are available for specialized needs, such as hemophilia or other clotting disorders. BEFORE THE TRANSFUSION  Who gives blood for transfusions?   Healthy volunteers who are fully evaluated to make sure their blood is safe. This is blood bank blood. Transfusion therapy is the safest it has ever been in the practice of medicine. Before blood is taken from a donor, a complete history is taken to make sure that person has no history of diseases nor engages in risky social behavior (examples are intravenous drug use or sexual activity with multiple partners). The donor's travel history is screened to  minimize risk of transmitting infections, such as malaria. The donated blood is tested for signs of infectious diseases, such as HIV and hepatitis. The blood is then tested to be sure it is compatible with you in order to minimize the chance of a transfusion reaction. If you or a relative donates blood, this is often done in anticipation of surgery and is not appropriate for emergency situations. It takes many days to process the donated blood. RISKS AND COMPLICATIONS Although transfusion therapy is very safe and saves many lives, the main dangers of transfusion include:   Getting an infectious disease.  Developing a transfusion reaction. This is an allergic reaction to something in the blood you were given. Every precaution is taken to prevent this. The decision to have a blood transfusion has been considered carefully by your caregiver before blood is given. Blood is not given unless the benefits outweigh the risks. AFTER THE TRANSFUSION  Right after receiving a blood transfusion, you will usually feel much better and more energetic. This is especially true if your red blood cells have gotten low (anemic). The transfusion raises the level of the red blood cells which carry oxygen, and this usually causes an energy increase.  The nurse administering the transfusion will monitor you carefully for complications.  HOME CARE INSTRUCTIONS  No special instructions are needed after a transfusion. You may find your energy is better. Speak with your caregiver about any limitations on activity for underlying diseases you may have. SEEK MEDICAL CARE IF:   Your condition is not improving after your transfusion.  You develop redness or irritation at the intravenous (IV) site. SEEK IMMEDIATE MEDICAL CARE IF:  Any of the following symptoms occur over the next 12 hours:  Shaking chills.  You have a temperature by mouth above 102 F (38.9 C), not controlled by medicine.  Chest, back, or muscle  pain.  People around you feel you are not acting correctly or are confused.  Shortness of breath or difficulty breathing.  Dizziness and fainting.  You get a rash or develop hives.  You have a decrease in urine output.  Your urine turns a dark color or changes to pink, red, or brown. Any of the following symptoms occur over the next 10 days:  You have a temperature by mouth above 102 F (38.9 C), not controlled by medicine.  Shortness of breath.  Weakness after normal activity.  The white part of the eye turns yellow (jaundice).  You have a decrease in the amount of urine or are urinating less often.  Your urine turns a dark color or changes to pink, red, or brown. Document Released: 05/12/2000 Document Revised: 08/07/2011 Document Reviewed: 12/30/2007 [facility] Patient Information 2014 [place], [place].  _______________________________________________________________________

## 2017-11-01 ENCOUNTER — Encounter (HOSPITAL_COMMUNITY): Payer: Self-pay

## 2017-11-01 ENCOUNTER — Encounter: Payer: Self-pay | Admitting: Family Medicine

## 2017-11-01 ENCOUNTER — Other Ambulatory Visit: Payer: Self-pay

## 2017-11-01 ENCOUNTER — Encounter (HOSPITAL_COMMUNITY)
Admission: RE | Admit: 2017-11-01 | Discharge: 2017-11-01 | Disposition: A | Discharge status: Home / Self Care | Payer: Medicaid Other | Source: Ambulatory Visit | Attending: Orthopedic Surgery | Admitting: Orthopedic Surgery

## 2017-11-01 DIAGNOSIS — Z01812 Encounter for preprocedural laboratory examination: Secondary | ICD-10-CM | POA: Insufficient documentation

## 2017-11-01 DIAGNOSIS — I10 Essential (primary) hypertension: Secondary | ICD-10-CM | POA: Insufficient documentation

## 2017-11-01 DIAGNOSIS — Z01810 Encounter for preprocedural cardiovascular examination: Secondary | ICD-10-CM | POA: Diagnosis not present

## 2017-11-01 HISTORY — DX: Gastro-esophageal reflux disease without esophagitis: K21.9

## 2017-11-01 HISTORY — DX: Unspecified osteoarthritis, unspecified site: M19.90

## 2017-11-01 HISTORY — DX: Prediabetes: R73.03

## 2017-11-01 HISTORY — DX: Essential (primary) hypertension: I10

## 2017-11-01 HISTORY — DX: Pneumonia, unspecified organism: J18.9

## 2017-11-01 LAB — SURGICAL PCR SCREEN
MRSA, PCR: NEGATIVE
Staphylococcus aureus: POSITIVE — AB

## 2017-11-01 LAB — BASIC METABOLIC PANEL
Anion gap: 7 (ref 5–15)
BUN: 20 mg/dL (ref 6–20)
CO2: 29 mmol/L (ref 22–32)
Calcium: 9.4 mg/dL (ref 8.9–10.3)
Chloride: 104 mmol/L (ref 101–111)
Creatinine, Ser: 0.97 mg/dL (ref 0.61–1.24)
GFR calc Af Amer: >60 mL/min (ref >60)
GFR calc non Af Amer: >60 mL/min (ref >60)
Glucose, Bld: 107 mg/dL — ABNORMAL HIGH (ref 65–99)
Potassium: 3.9 mmol/L (ref 3.5–5.1)
Sodium: 140 mmol/L (ref 135–145)

## 2017-11-01 LAB — HEMOGLOBIN A1C
Hgb A1c MFr Bld: 5.6 % (ref 4.8–5.6)
Mean Plasma Glucose: 114.02 mg/dL

## 2017-11-01 LAB — ABO/RH: ABO/RH(D): O POS

## 2017-11-02 ENCOUNTER — Encounter: Payer: Self-pay | Admitting: Family Medicine

## 2017-11-05 MED ORDER — TRANEXAMIC ACID 1000 MG/10ML IV SOLN
1000.0000 mg | INTRAVENOUS | Status: AC
  Administered 2017-11-06: 1000 mg via INTRAVENOUS
  Filled 2017-11-05: qty 1100

## 2017-11-06 ENCOUNTER — Inpatient Hospital Stay (HOSPITAL_COMMUNITY): Payer: Medicaid Other

## 2017-11-06 ENCOUNTER — Inpatient Hospital Stay (HOSPITAL_COMMUNITY)
Admission: RE | Admit: 2017-11-06 | Discharge: 2017-11-07 | DRG: 470 | Disposition: A | Discharge status: Home / Self Care | Payer: Medicaid Other | Attending: Orthopedic Surgery | Admitting: Orthopedic Surgery

## 2017-11-06 ENCOUNTER — Encounter (HOSPITAL_COMMUNITY): Payer: Self-pay | Admitting: Anesthesiology

## 2017-11-06 ENCOUNTER — Telehealth (HOSPITAL_COMMUNITY): Payer: Self-pay | Admitting: *Deleted

## 2017-11-06 ENCOUNTER — Inpatient Hospital Stay (HOSPITAL_COMMUNITY): Payer: Medicaid Other | Admitting: Anesthesiology

## 2017-11-06 ENCOUNTER — Encounter (HOSPITAL_COMMUNITY)
Admission: RE | Disposition: A | Discharge status: Home / Self Care | Payer: Self-pay | Source: Home / Self Care | Attending: Orthopedic Surgery

## 2017-11-06 ENCOUNTER — Other Ambulatory Visit: Payer: Self-pay

## 2017-11-06 DIAGNOSIS — Z6830 Body mass index (BMI) 30.0-30.9, adult: Secondary | ICD-10-CM

## 2017-11-06 DIAGNOSIS — Z87891 Personal history of nicotine dependence: Secondary | ICD-10-CM

## 2017-11-06 DIAGNOSIS — Z8249 Family history of ischemic heart disease and other diseases of the circulatory system: Secondary | ICD-10-CM | POA: Diagnosis not present

## 2017-11-06 DIAGNOSIS — Z9181 History of falling: Secondary | ICD-10-CM

## 2017-11-06 DIAGNOSIS — Z91010 Allergy to peanuts: Secondary | ICD-10-CM | POA: Diagnosis not present

## 2017-11-06 DIAGNOSIS — Z91018 Allergy to other foods: Secondary | ICD-10-CM

## 2017-11-06 DIAGNOSIS — Z96641 Presence of right artificial hip joint: Secondary | ICD-10-CM

## 2017-11-06 DIAGNOSIS — M1611 Unilateral primary osteoarthritis, right hip: Principal | ICD-10-CM | POA: Diagnosis present

## 2017-11-06 DIAGNOSIS — I10 Essential (primary) hypertension: Secondary | ICD-10-CM | POA: Diagnosis present

## 2017-11-06 DIAGNOSIS — E669 Obesity, unspecified: Secondary | ICD-10-CM | POA: Diagnosis present

## 2017-11-06 DIAGNOSIS — Z96649 Presence of unspecified artificial hip joint: Secondary | ICD-10-CM

## 2017-11-06 HISTORY — PX: TOTAL HIP ARTHROPLASTY: SHX124

## 2017-11-06 LAB — TYPE AND SCREEN
ABO/RH(D): O POS
Antibody Screen: NEGATIVE

## 2017-11-06 IMAGING — DX DG PORTABLE PELVIS
1 series · 1 of 1 positions shown · non-contrast
Comparison: [DATE]

CLINICAL DATA: Right hip replacement

EXAM:
PORTABLE PELVIS 1-2 VIEWS

[pelvis ap]
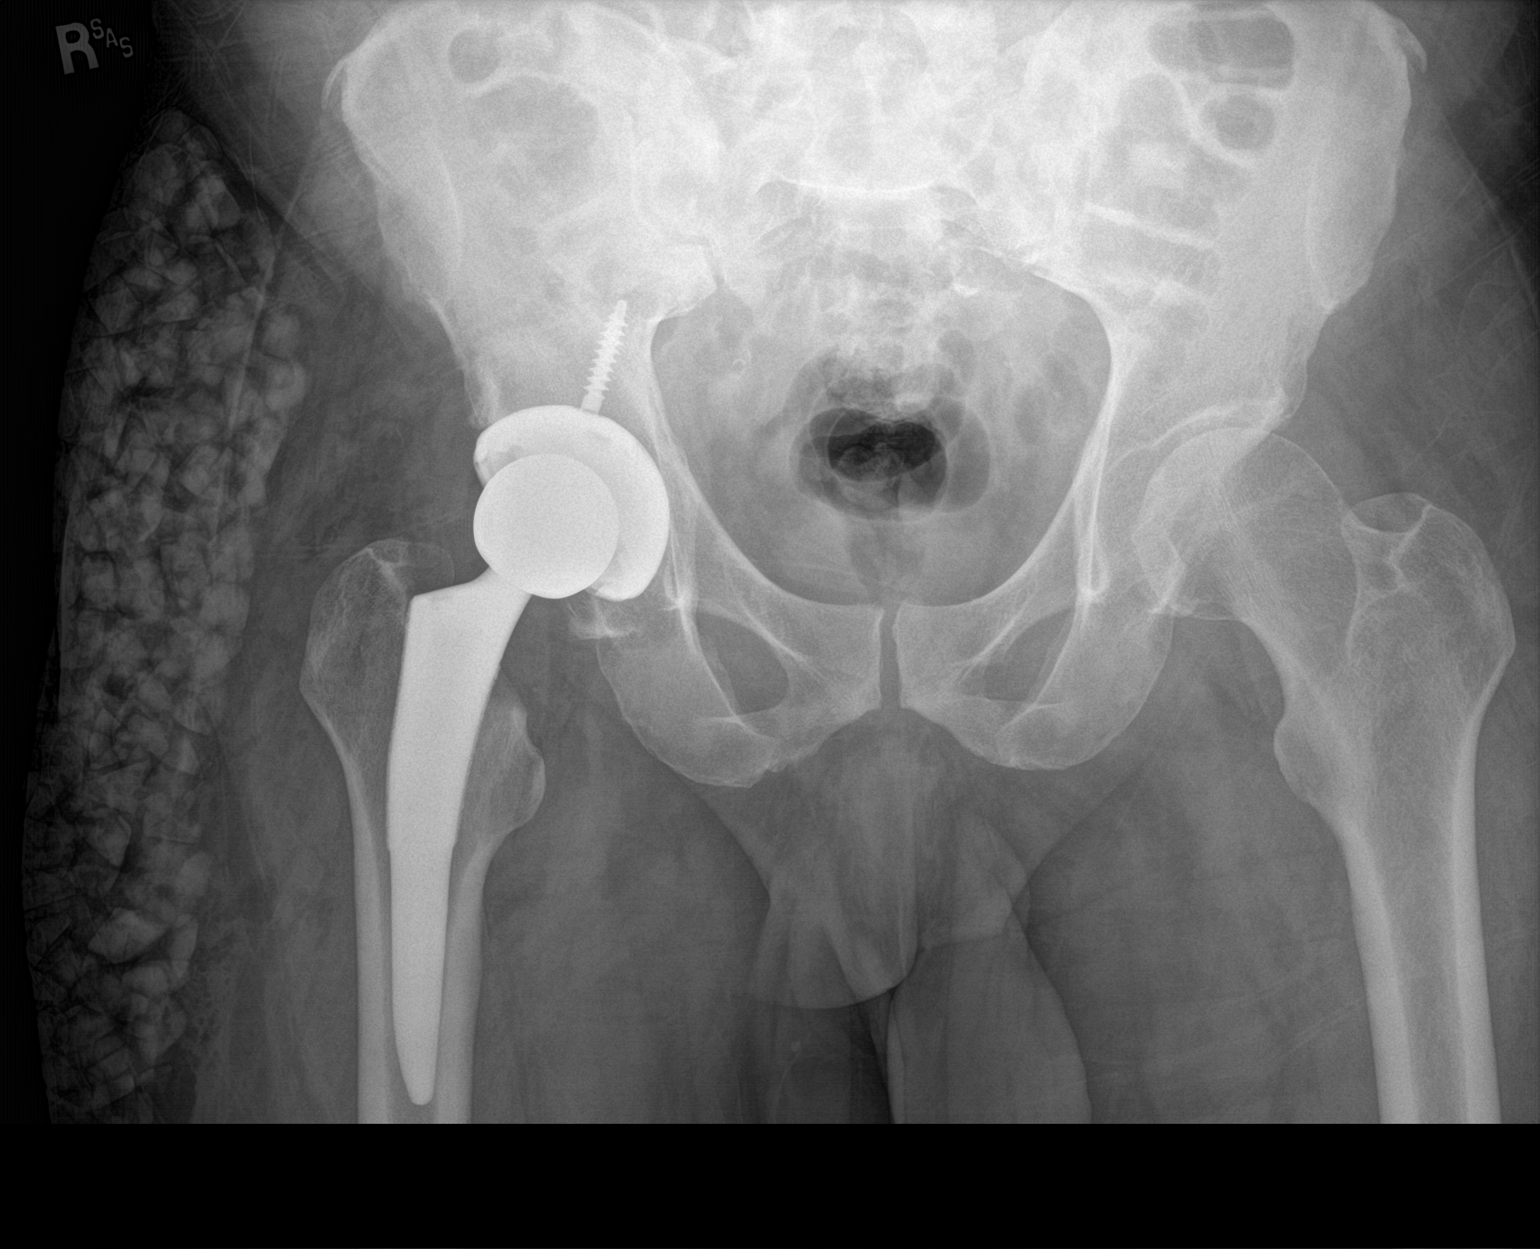

[1 of 1 positions shown; findings below may reference images not displayed]

FINDINGS: Right hip replacement in satisfactory position alignment. No
fracture or complication.
IMPRESSION: Satisfactory right hip replacement

## 2017-11-06 SURGERY — ARTHROPLASTY, HIP, TOTAL, ANTERIOR APPROACH
Anesthesia: Spinal | Site: Hip | Laterality: Right

## 2017-11-06 MED ORDER — MORPHINE SULFATE (PF) 4 MG/ML IV SOLN: 0.5000 mg | INTRAVENOUS | Status: DC | PRN

## 2017-11-06 MED ORDER — PROPOFOL 10 MG/ML IV BOLUS
INTRAVENOUS | Status: AC
  Filled 2017-11-06: qty 40

## 2017-11-06 MED ORDER — ONDANSETRON HCL 4 MG PO TABS: 4.0000 mg | ORAL_TABLET | Freq: Four times a day (QID) | ORAL | Status: DC | PRN

## 2017-11-06 MED ORDER — ASPIRIN 81 MG PO CHEW
81.0000 mg | CHEWABLE_TABLET | Freq: Two times a day (BID) | ORAL | Status: DC
Start: 2017-11-06 — End: 2017-11-07
  Administered 2017-11-06 – 2017-11-07 (×2): 81 mg via ORAL
  Filled 2017-11-06 (×2): qty 1

## 2017-11-06 MED ORDER — MIDAZOLAM HCL 5 MG/5ML IJ SOLN
INTRAMUSCULAR | Status: DC | PRN
  Administered 2017-11-06: 1.5 mg via INTRAVENOUS
  Administered 2017-11-06: 0.5 mg via INTRAVENOUS

## 2017-11-06 MED ORDER — BISACODYL 10 MG RE SUPP: 10.0000 mg | Freq: Every day | RECTAL | Status: DC | PRN

## 2017-11-06 MED ORDER — PHENOL 1.4 % MT LIQD: 1.0000 | OROMUCOSAL | Status: DC | PRN

## 2017-11-06 MED ORDER — LACTATED RINGERS IV SOLN
INTRAVENOUS | Status: DC | PRN
  Administered 2017-11-06 (×2): via INTRAVENOUS

## 2017-11-06 MED ORDER — METHOCARBAMOL 500 MG PO TABS: 500.0000 mg | ORAL_TABLET | Freq: Four times a day (QID) | ORAL | 0 refills | Status: DC | PRN

## 2017-11-06 MED ORDER — CHLORHEXIDINE GLUCONATE 4 % EX LIQD: 60.0000 mL | Freq: Once | CUTANEOUS | Status: DC

## 2017-11-06 MED ORDER — DEXAMETHASONE SODIUM PHOSPHATE 10 MG/ML IJ SOLN
10.0000 mg | Freq: Once | INTRAMUSCULAR | Status: AC
  Administered 2017-11-07: 10 mg via INTRAVENOUS
  Filled 2017-11-06: qty 1

## 2017-11-06 MED ORDER — DOCUSATE SODIUM 100 MG PO CAPS: 100.0000 mg | ORAL_CAPSULE | Freq: Two times a day (BID) | ORAL | 0 refills | Status: DC

## 2017-11-06 MED ORDER — STERILE WATER FOR IRRIGATION IR SOLN
Status: DC | PRN
  Administered 2017-11-06: 2000 mL

## 2017-11-06 MED ORDER — SODIUM CHLORIDE 0.9 % IV SOLN
1000.0000 mg | Freq: Once | INTRAVENOUS | Status: AC
  Administered 2017-11-06: 1000 mg via INTRAVENOUS
  Filled 2017-11-06: qty 1100

## 2017-11-06 MED ORDER — FERROUS SULFATE 325 (65 FE) MG PO TABS
325.0000 mg | ORAL_TABLET | Freq: Three times a day (TID) | ORAL | Status: DC
  Administered 2017-11-07 (×2): 325 mg via ORAL
  Filled 2017-11-06 (×2): qty 1

## 2017-11-06 MED ORDER — ONDANSETRON HCL 4 MG/2ML IJ SOLN
INTRAMUSCULAR | Status: DC | PRN
  Administered 2017-11-06: 4 mg via INTRAVENOUS

## 2017-11-06 MED ORDER — PHENYLEPHRINE HCL 10 MG/ML IJ SOLN
INTRAMUSCULAR | Status: AC
  Filled 2017-11-06: qty 1

## 2017-11-06 MED ORDER — FERROUS SULFATE 325 (65 FE) MG PO TABS: 325.0000 mg | ORAL_TABLET | Freq: Three times a day (TID) | ORAL | 3 refills | Status: DC

## 2017-11-06 MED ORDER — POLYETHYLENE GLYCOL 3350 17 G PO PACK
17.0000 g | PACK | Freq: Two times a day (BID) | ORAL | Status: DC
  Administered 2017-11-06 – 2017-11-07 (×2): 17 g via ORAL
  Filled 2017-11-06 (×2): qty 1

## 2017-11-06 MED ORDER — BUPIVACAINE IN DEXTROSE 0.75-8.25 % IT SOLN
INTRATHECAL | Status: DC | PRN
  Administered 2017-11-06: 2 mL via INTRATHECAL

## 2017-11-06 MED ORDER — ONDANSETRON HCL 4 MG/2ML IJ SOLN: 4.0000 mg | Freq: Four times a day (QID) | INTRAMUSCULAR | Status: DC | PRN

## 2017-11-06 MED ORDER — CELECOXIB 200 MG PO CAPS
200.0000 mg | ORAL_CAPSULE | Freq: Two times a day (BID) | ORAL | Status: DC
  Administered 2017-11-06 – 2017-11-07 (×2): 200 mg via ORAL
  Filled 2017-11-06 (×2): qty 1

## 2017-11-06 MED ORDER — DIPHENHYDRAMINE HCL 12.5 MG/5ML PO ELIX: 12.5000 mg | ORAL_SOLUTION | ORAL | Status: DC | PRN

## 2017-11-06 MED ORDER — AMLODIPINE BESYLATE 5 MG PO TABS
5.0000 mg | ORAL_TABLET | Freq: Every day | ORAL | Status: DC
  Administered 2017-11-07: 5 mg via ORAL
  Filled 2017-11-06: qty 1

## 2017-11-06 MED ORDER — ASPIRIN 81 MG PO CHEW: 81.0000 mg | CHEWABLE_TABLET | Freq: Two times a day (BID) | ORAL | 0 refills | Status: AC

## 2017-11-06 MED ORDER — METHOCARBAMOL 500 MG PO TABS
500.0000 mg | ORAL_TABLET | Freq: Four times a day (QID) | ORAL | Status: DC | PRN
  Administered 2017-11-06 – 2017-11-07 (×2): 500 mg via ORAL
  Filled 2017-11-06 (×2): qty 1

## 2017-11-06 MED ORDER — HYDROMORPHONE HCL 1 MG/ML IJ SOLN: 0.2500 mg | INTRAMUSCULAR | Status: DC | PRN

## 2017-11-06 MED ORDER — PROPOFOL 500 MG/50ML IV EMUL
INTRAVENOUS | Status: DC | PRN
  Administered 2017-11-06: 50 ug/kg/min via INTRAVENOUS

## 2017-11-06 MED ORDER — METHOCARBAMOL 1000 MG/10ML IJ SOLN
500.0000 mg | Freq: Four times a day (QID) | INTRAVENOUS | Status: DC | PRN
  Administered 2017-11-06: 500 mg via INTRAVENOUS
  Filled 2017-11-06: qty 550

## 2017-11-06 MED ORDER — MIDAZOLAM HCL 2 MG/2ML IJ SOLN
INTRAMUSCULAR | Status: AC
  Filled 2017-11-06: qty 2

## 2017-11-06 MED ORDER — ACETAMINOPHEN 325 MG PO TABS: 325.0000 mg | ORAL_TABLET | Freq: Four times a day (QID) | ORAL | Status: DC | PRN

## 2017-11-06 MED ORDER — SODIUM CHLORIDE 0.9 % IV SOLN
INTRAVENOUS | Status: DC
  Administered 2017-11-06 (×2): via INTRAVENOUS

## 2017-11-06 MED ORDER — PROPOFOL 10 MG/ML IV BOLUS
INTRAVENOUS | Status: AC
  Filled 2017-11-06: qty 60

## 2017-11-06 MED ORDER — HYDROCODONE-ACETAMINOPHEN 7.5-325 MG PO TABS: 1.0000 | ORAL_TABLET | ORAL | 0 refills | Status: DC | PRN

## 2017-11-06 MED ORDER — MAGNESIUM CITRATE PO SOLN: 1.0000 | Freq: Once | ORAL | Status: DC | PRN

## 2017-11-06 MED ORDER — DOCUSATE SODIUM 100 MG PO CAPS
100.0000 mg | ORAL_CAPSULE | Freq: Two times a day (BID) | ORAL | Status: DC
  Administered 2017-11-06 – 2017-11-07 (×2): 100 mg via ORAL
  Filled 2017-11-06 (×2): qty 1

## 2017-11-06 MED ORDER — ONDANSETRON HCL 4 MG/2ML IJ SOLN
INTRAMUSCULAR | Status: AC
  Filled 2017-11-06: qty 2

## 2017-11-06 MED ORDER — DEXAMETHASONE SODIUM PHOSPHATE 10 MG/ML IJ SOLN
10.0000 mg | Freq: Once | INTRAMUSCULAR | Status: AC
  Administered 2017-11-06: 10 mg via INTRAVENOUS

## 2017-11-06 MED ORDER — POLYETHYLENE GLYCOL 3350 17 G PO PACK: 17.0000 g | PACK | Freq: Two times a day (BID) | ORAL | 0 refills | Status: DC

## 2017-11-06 MED ORDER — DEXAMETHASONE SODIUM PHOSPHATE 10 MG/ML IJ SOLN
INTRAMUSCULAR | Status: AC
  Filled 2017-11-06: qty 3

## 2017-11-06 MED ORDER — HYDROCODONE-ACETAMINOPHEN 5-325 MG PO TABS
1.0000 | ORAL_TABLET | ORAL | Status: DC | PRN
  Administered 2017-11-06: 1 via ORAL
  Administered 2017-11-06 – 2017-11-07 (×3): 2 via ORAL
  Filled 2017-11-06: qty 1
  Filled 2017-11-06 (×3): qty 2

## 2017-11-06 MED ORDER — FENTANYL CITRATE (PF) 100 MCG/2ML IJ SOLN
INTRAMUSCULAR | Status: DC | PRN
  Administered 2017-11-06: 50 ug via INTRAVENOUS

## 2017-11-06 MED ORDER — ONDANSETRON HCL 4 MG/2ML IJ SOLN
INTRAMUSCULAR | Status: AC
  Filled 2017-11-06: qty 6

## 2017-11-06 MED ORDER — FENTANYL CITRATE (PF) 100 MCG/2ML IJ SOLN
INTRAMUSCULAR | Status: AC
  Filled 2017-11-06: qty 2

## 2017-11-06 MED ORDER — CEFAZOLIN SODIUM-DEXTROSE 2-4 GM/100ML-% IV SOLN
2.0000 g | Freq: Four times a day (QID) | INTRAVENOUS | Status: AC
  Administered 2017-11-06 (×2): 2 g via INTRAVENOUS
  Filled 2017-11-06 (×2): qty 100

## 2017-11-06 MED ORDER — CEFAZOLIN SODIUM-DEXTROSE 2-4 GM/100ML-% IV SOLN
2.0000 g | INTRAVENOUS | Status: AC
  Administered 2017-11-06: 2 g via INTRAVENOUS
  Filled 2017-11-06: qty 100

## 2017-11-06 MED ORDER — METOCLOPRAMIDE HCL 5 MG PO TABS: 5.0000 mg | ORAL_TABLET | Freq: Three times a day (TID) | ORAL | Status: DC | PRN

## 2017-11-06 MED ORDER — MENTHOL 3 MG MT LOZG: 1.0000 | LOZENGE | OROMUCOSAL | Status: DC | PRN

## 2017-11-06 MED ORDER — HYDROCODONE-ACETAMINOPHEN 7.5-325 MG PO TABS: 1.0000 | ORAL_TABLET | ORAL | Status: DC | PRN

## 2017-11-06 MED ORDER — PROMETHAZINE HCL 25 MG/ML IJ SOLN: 6.2500 mg | INTRAMUSCULAR | Status: DC | PRN

## 2017-11-06 MED ORDER — LACTATED RINGERS IV SOLN
INTRAVENOUS | Status: DC
  Administered 2017-11-06: 08:00:00 via INTRAVENOUS

## 2017-11-06 MED ORDER — METOCLOPRAMIDE HCL 5 MG/ML IJ SOLN: 5.0000 mg | Freq: Three times a day (TID) | INTRAMUSCULAR | Status: DC | PRN

## 2017-11-06 MED ORDER — EPHEDRINE SULFATE 50 MG/ML IJ SOLN
INTRAMUSCULAR | Status: DC | PRN
  Administered 2017-11-06: 10 mg via INTRAVENOUS
  Administered 2017-11-06: 5 mg via INTRAVENOUS

## 2017-11-06 MED ORDER — SODIUM CHLORIDE 0.9 % IR SOLN
Status: DC | PRN
  Administered 2017-11-06: 1000 mL

## 2017-11-06 MED ORDER — PHENYLEPHRINE HCL 10 MG/ML IJ SOLN
INTRAMUSCULAR | Status: DC | PRN
  Administered 2017-11-06 (×2): 120 ug via INTRAVENOUS
  Administered 2017-11-06 (×2): 80 ug via INTRAVENOUS

## 2017-11-06 MED ORDER — DEXAMETHASONE SODIUM PHOSPHATE 10 MG/ML IJ SOLN
INTRAMUSCULAR | Status: AC
  Filled 2017-11-06: qty 1

## 2017-11-06 MED ORDER — PHENYLEPHRINE 40 MCG/ML (10ML) SYRINGE FOR IV PUSH (FOR BLOOD PRESSURE SUPPORT)
PREFILLED_SYRINGE | INTRAVENOUS | Status: AC
  Filled 2017-11-06: qty 10

## 2017-11-06 MED ORDER — ALUM & MAG HYDROXIDE-SIMETH 200-200-20 MG/5ML PO SUSP: 15.0000 mL | ORAL | Status: DC | PRN

## 2017-11-06 SURGICAL SUPPLY — 43 items
ADH SKN CLS APL DERMABOND .7 (GAUZE/BANDAGES/DRESSINGS) ×1
BAG DECANTER FOR FLEXI CONT (MISCELLANEOUS) IMPLANT
BAG SPEC THK2 15X12 ZIP CLS (MISCELLANEOUS)
BAG ZIPLOCK 12X15 (MISCELLANEOUS) IMPLANT
BLADE SAG 18X100X1.27 (BLADE) ×2 IMPLANT
CAPT HIP TOTAL 2 ×1 IMPLANT
COVER PERINEAL POST (MISCELLANEOUS) ×2 IMPLANT
COVER SURGICAL LIGHT HANDLE (MISCELLANEOUS) ×2 IMPLANT
DERMABOND ADVANCED (GAUZE/BANDAGES/DRESSINGS) ×1
DERMABOND ADVANCED .7 DNX12 (GAUZE/BANDAGES/DRESSINGS) ×1 IMPLANT
DRAPE STERI IOBAN 125X83 (DRAPES) ×2 IMPLANT
DRAPE U-SHAPE 47X51 STRL (DRAPES) ×4 IMPLANT
DRESSING AQUACEL AG SP 3.5X10 (GAUZE/BANDAGES/DRESSINGS) ×1 IMPLANT
DRSG AQUACEL AG SP 3.5X10 (GAUZE/BANDAGES/DRESSINGS) ×2
DURAPREP 26ML APPLICATOR (WOUND CARE) ×2 IMPLANT
ELECT REM PT RETURN 15FT ADLT (MISCELLANEOUS) ×2 IMPLANT
GLOVE BIOGEL M STRL SZ7.5 (GLOVE) IMPLANT
GLOVE BIOGEL PI IND STRL 6.5 (GLOVE) IMPLANT
GLOVE BIOGEL PI IND STRL 7.0 (GLOVE) IMPLANT
GLOVE BIOGEL PI IND STRL 7.5 (GLOVE) ×1 IMPLANT
GLOVE BIOGEL PI IND STRL 8.5 (GLOVE) ×1 IMPLANT
GLOVE BIOGEL PI INDICATOR 6.5 (GLOVE) ×2
GLOVE BIOGEL PI INDICATOR 7.0 (GLOVE) ×2
GLOVE BIOGEL PI INDICATOR 7.5 (GLOVE) ×2
GLOVE BIOGEL PI INDICATOR 8.5 (GLOVE) ×1
GLOVE ECLIPSE 8.0 STRL XLNG CF (GLOVE) ×4 IMPLANT
GLOVE ORTHO TXT STRL SZ7.5 (GLOVE) ×2 IMPLANT
GLOVE SURG SS PI 6.0 STRL IVOR (GLOVE) ×1 IMPLANT
GLOVE SURG SS PI 6.5 STRL IVOR (GLOVE) ×1 IMPLANT
GLOVE SURG SS PI 7.5 STRL IVOR (GLOVE) ×1 IMPLANT
GOWN STRL REUS W/TWL 2XL LVL3 (GOWN DISPOSABLE) ×2 IMPLANT
GOWN STRL REUS W/TWL LRG LVL3 (GOWN DISPOSABLE) ×5 IMPLANT
HOLDER FOLEY CATH W/STRAP (MISCELLANEOUS) ×2 IMPLANT
PACK ANTERIOR HIP CUSTOM (KITS) ×2 IMPLANT
SUT MNCRL AB 4-0 PS2 18 (SUTURE) ×2 IMPLANT
SUT STRATAFIX 0 PDS 27 VIOLET (SUTURE) ×2
SUT VIC AB 1 CT1 36 (SUTURE) ×6 IMPLANT
SUT VIC AB 2-0 CT1 27 (SUTURE) ×4
SUT VIC AB 2-0 CT1 TAPERPNT 27 (SUTURE) ×2 IMPLANT
SUTURE STRATFX 0 PDS 27 VIOLET (SUTURE) ×1 IMPLANT
TRAY FOLEY MTR SLVR 16FR STAT (SET/KITS/TRAYS/PACK) ×1 IMPLANT
WATER STERILE IRR 1000ML POUR (IV SOLUTION) ×2 IMPLANT
YANKAUER SUCT BULB TIP 10FT TU (MISCELLANEOUS) IMPLANT

## 2017-11-06 NOTE — Interval H&P Note (Signed)
History and Physical Interval Note:  11/06/2017 9:06 AM  Kevin Villa  has presented today for surgery, with the diagnosis of Right hip osteoarthritis  The various methods of treatment have been discussed with the patient and family. After consideration of risks, benefits and other options for treatment, the patient has consented to  Procedure(s) with comments: RIGHT TOTAL HIP ARTHROPLASTY ANTERIOR APPROACH (Right) - 70 mins as a surgical intervention .  The patient's history has been reviewed, patient examined, no change in status, stable for surgery.  I have reviewed the patient's chart and labs.  Questions were answered to the patient's satisfaction.     Shelda PalMatthew D Aaralyn Kil

## 2017-11-06 NOTE — Anesthesia Procedure Notes (Signed)
Procedure Name: MAC Date/Time: 11/06/2017 10:19 AM Performed by: West Pugh, CRNA Pre-anesthesia Checklist: Patient identified, Emergency Drugs available, Suction available, Patient being monitored and Timeout performed Patient Re-evaluated:Patient Re-evaluated prior to induction Oxygen Delivery Method: Simple face mask Preoxygenation: Pre-oxygenation with 100% oxygen Induction Type: IV induction Placement Confirmation: positive ETCO2 Dental Injury: Teeth and Oropharynx as per pre-operative assessment

## 2017-11-06 NOTE — Anesthesia Procedure Notes (Signed)
Spinal  Patient location during procedure: OR Start time: 11/06/2017 10:19 AM End time: 11/06/2017 10:25 AM Reason for block: at surgeon's request Staffing Resident/CRNA: West Pugh, CRNA Performed: resident/CRNA  Preanesthetic Checklist Completed: patient identified, site marked, surgical consent, pre-op evaluation, timeout performed, IV checked, risks and benefits discussed and monitors and equipment checked Spinal Block Patient position: sitting Prep: DuraPrep Patient monitoring: heart rate, continuous pulse ox and blood pressure Approach: midline Location: L3-4 Injection technique: single-shot Needle Needle type: Pencan  Needle gauge: 24 G Needle length: 9 cm Assessment Sensory level: T4 Additional Notes Expiration of kit checked and confirmed. Patient tolerated procedure well,without complications x 1 attempt with noted clear CSF. Loss of motor and sensory on exam post injection.

## 2017-11-06 NOTE — Op Note (Signed)
NAME:  Kevin SaxonRay A Slayden                ACCOUNT NO.: [id number]      MEDICAL RECORD NO.: [id number]      [facility]      PHYSICIAN:  Shelda PalMatthew D Terri Malerba  DATE OF BIRTH:  [birth year]     DATE OF PROCEDURE:  11/06/2017                                 OPERATIVE REPORT         PREOPERATIVE DIAGNOSIS: Right  hip osteoarthritis.      POSTOPERATIVE DIAGNOSIS:  Right hip osteoarthritis.      PROCEDURE:  Right total hip replacement through an anterior approach   utilizing DePuy THR system, component size 56mm pinnacle cup, a size 36+4 neutral   Altrex liner, a size 7 Hi Tri Lock stem with a 36+1 Articuleze metal ball.      SURGEON:  Madlyn FrankelMatthew D. Charlann Boxerlin, M.D.      ASSISTANT:  Lanney GinsMatthew Babish, PA-C     ANESTHESIA:  Spinal.      SPECIMENS:  None.      COMPLICATIONS:  None.      BLOOD LOSS:  350 cc     DRAINS:  None.      INDICATION OF THE PROCEDURE:  Kevin Villa is a 62 y.o. male who had   presented to office for evaluation of right hip pain.  Radiographs revealed   progressive degenerative changes with bone-on-bone   articulation of the  hip joint, including subchondral cystic changes and osteophytes.  The patient had painful limited range of   motion significantly affecting their overall quality of life and function.  The patient was failing to    respond to conservative measures including medications and/or injections and activity modification and at this point was ready   to proceed with more definitive measures.  Consent was obtained for   benefit of pain relief.  Specific risks of infection, DVT, component   failure, dislocation, neurovascular injury, and need for revision surgery were reviewed in the office as well discussion of   the anterior versus posterior approach were reviewed.     PROCEDURE IN DETAIL:  The patient was brought to operative theater.   Once adequate anesthesia, preoperative antibiotics, 2 gm of Ancef, 1 gm of Tranexamic Acid, and  10 mg of Decadron were administered, the patient was positioned supine on the [employer] table.  Once the patient was safely positioned with adequate padding of boney prominences we predraped out the hip, and used fluoroscopy to confirm orientation of the pelvis.      The right hip was then prepped and draped from proximal iliac crest to   mid thigh with a shower curtain technique.      Time-out was performed identifying the patient, planned procedure, and the appropriate extremity.     An incision was then made 2 cm lateral to the   anterior superior iliac spine extending over the orientation of the   tensor fascia lata muscle and sharp dissection was carried down to the   fascia of the muscle.      The fascia was then incised.  The muscle belly was identified and swept   laterally and retractor placed along the superior neck.  Following   cauterization of the circumflex vessels and removing some pericapsular  fat, a second cobra retractor was placed on the inferior neck.  A T-capsulotomy was made along the line of the   superior neck to the trochanteric fossa, then extended proximally and   distally.  Tag sutures were placed and the retractors were then placed   intracapsular.  We then identified the trochanteric fossa and   orientation of my neck cut and then made a neck osteotomy with the femur on traction.  The femoral   head was removed without difficulty or complication.  Traction was let   off and retractors were placed posterior and anterior around the   acetabulum.      The labrum and foveal tissue were debrided.  I began reaming with a 48 mm   reamer and reamed up to 55 mm reamer with good bony bed preparation and a 56 mm  cup was chosen.  The final 56 mm Pinnacle cup was then impacted under fluoroscopy to confirm the depth of penetration and orientation with respect to   Abduction and forward flexion.  A screw was placed into the ilium followed by the hole eliminator.  The  final   36+4 neutral Altrex liner was impacted with good visualized rim fit.  The cup was positioned anatomically within the acetabular portion of the pelvis.      At this point, the femur was rolled to 100 degrees.  Further capsule was   released off the inferior aspect of the femoral neck.  I then   released the superior capsule proximally.  With the leg in a neutral position the hook was placed laterally   along the femur under the vastus lateralis origin and elevated manually and then held in position using the hook attachment on the bed.  The leg was then extended and adducted with the leg rolled to 100   degrees of external rotation.  Retractors were placed along the medial calcar and posteriorly over the greater trochanter.  Once the proximal femur was fully   exposed, I used a box osteotome to set orientation.  I then began   broaching with the starting chili pepper broach and passed this by hand and then broached up to 7.  With the 7 broach in place I chose a high offset neck and did several trial reductions.  The offset was appropriate, leg lengths   appeared to be equal best matched with the 36+1.5 head ball trial confirmed radiographically.   Given these findings, I went ahead and dislocated the hip, repositioned all   retractors and positioned the right hip in the extended and abducted position.  The final 7 Hi Tri Lock stem was   chosen and it was impacted down to the level of neck cut.  Based on this   and the trial reductions, a final 36+1.5 Articuleze metal head ball was chosen and   impacted onto a clean and dry trunnion, and the hip was reduced.  The   hip had been irrigated throughout the case again at this point.  I did   reapproximate the superior capsular leaflet to the anterior leaflet   using #1 Vicryl.  The fascia of the   tensor fascia lata muscle was then reapproximated using #1 Vicryl and #0 Stratafix sutures.  The   remaining wound was closed with 2-0 Vicryl and  running 4-0 Monocryl.   The hip was cleaned, dried, and dressed sterilely using Dermabond and   Aquacel dressing.  The patient was then brought   to recovery  room in stable condition tolerating the procedure well.    Lanney Gins, PA-C was present for the entirety of the case involved from   preoperative positioning, perioperative retractor management, general   facilitation of the case, as well as primary wound closure as assistant.            Madlyn Frankel Charlann Boxer, M.D.        11/06/2017 11:59 AM

## 2017-11-06 NOTE — Progress Notes (Signed)
PT Cancellation Note  Patient Details Name: Kevin Villa MRN: [id number] DOB: 06/26/1955   Cancelled Treatment:    Reason Eval/Treat Not Completed: Pain limiting ability to participate   Rada HayHill, Taten Merrow Elizabeth 11/06/2017, 6:04 PM

## 2017-11-06 NOTE — Transfer of Care (Signed)
Immediate Anesthesia Transfer of Care Note  Patient: Kevin Villa  Procedure(s) Performed: RIGHT TOTAL HIP ARTHROPLASTY ANTERIOR APPROACH (Right Hip)  Patient Location: PACU  Anesthesia Type:Spinal  Level of Consciousness: awake  Airway & Oxygen Therapy: Patient Spontanous Breathing and Patient connected to nasal cannula oxygen  Post-op Assessment: Report given to RN and Post -op Vital signs reviewed and stable  Post vital signs: Reviewed and stable  Last Vitals:  Vitals Value Taken Time  BP    Temp    Pulse 83 11/06/2017 12:08 PM  Resp 17 11/06/2017 12:08 PM  SpO2 100 % 11/06/2017 12:08 PM  Vitals shown include unvalidated device data.  Last Pain:  Vitals:   11/06/17 0817  TempSrc:   PainSc: 10-Worst pain ever      Patients Stated Pain Goal: 3 (11/06/17 0817)  Complications: No apparent anesthesia complications

## 2017-11-06 NOTE — Discharge Instructions (Signed)

## 2017-11-06 NOTE — Anesthesia Postprocedure Evaluation (Signed)
Anesthesia Post Note  Patient: Donovon A Arenson  Procedure(s) Performed: RIGHT TOTAL HIP ARTHROPLASTY ANTERIOR APPROACH (Right Hip)     Patient location during evaluation: PACU Anesthesia Type: Spinal Level of consciousness: oriented and awake and alert Pain management: pain level controlled Vital Signs Assessment: post-procedure vital signs reviewed and stable Respiratory status: spontaneous breathing, respiratory function stable and patient connected to nasal cannula oxygen Cardiovascular status: blood pressure returned to baseline and stable Postop Assessment: no headache, no backache and no apparent nausea or vomiting Anesthetic complications: no    Last Vitals:  Vitals:   11/06/17 1245 11/06/17 1300  BP: 127/78 137/84  Pulse: 65 69  Resp: 14 17  Temp:  (!) 36.3 C  SpO2: 100% 100%    Last Pain:  Vitals:   11/06/17 1300  TempSrc:   PainSc: 0-No pain                 Destynee Stringfellow S

## 2017-11-06 NOTE — Anesthesia Preprocedure Evaluation (Signed)
Anesthesia Evaluation  Patient identified by MRN, date of birth, ID band Patient awake    Reviewed: Allergy & Precautions, NPO status , Patient's Chart, lab work & pertinent test results  Airway Mallampati: II  TM Distance: >3 FB Neck ROM: Full    Dental no notable dental hx.    Pulmonary neg pulmonary ROS, former smoker,    Pulmonary exam normal breath sounds clear to auscultation       Cardiovascular hypertension, Pt. on medications Normal cardiovascular exam Rhythm:Regular Rate:Normal     Neuro/Psych negative neurological ROS  negative psych ROS   GI/Hepatic negative GI ROS, (+)     substance abuse  alcohol use,   Endo/Other  negative endocrine ROS  Renal/GU negative Renal ROS  negative genitourinary   Musculoskeletal negative musculoskeletal ROS (+)   Abdominal   Peds negative pediatric ROS (+)  Hematology negative hematology ROS (+)   Anesthesia Other Findings   Reproductive/Obstetrics negative OB ROS                             Anesthesia Physical Anesthesia Plan  ASA: II  Anesthesia Plan: Spinal   Post-op Pain Management:    Induction: Intravenous  PONV Risk Score and Plan: 1 and Ondansetron, Dexamethasone and Treatment may vary due to age or medical condition  Airway Management Planned: Simple Face Mask  Additional Equipment:   Intra-op Plan:   Post-operative Plan:   Informed Consent: I have reviewed the patients History and Physical, chart, labs and discussed the procedure including the risks, benefits and alternatives for the proposed anesthesia with the patient or authorized representative who has indicated his/her understanding and acceptance.   Dental advisory given  Plan Discussed with: CRNA and Surgeon  Anesthesia Plan Comments:         Anesthesia Quick Evaluation

## 2017-11-07 LAB — BASIC METABOLIC PANEL
Anion gap: 8 (ref 5–15)
BUN: 20 mg/dL (ref 6–20)
CO2: 28 mmol/L (ref 22–32)
Calcium: 8.8 mg/dL — ABNORMAL LOW (ref 8.9–10.3)
Chloride: 102 mmol/L (ref 101–111)
Creatinine, Ser: 0.96 mg/dL (ref 0.61–1.24)
GFR calc Af Amer: >60 mL/min (ref >60)
GFR calc non Af Amer: >60 mL/min (ref >60)
Glucose, Bld: 133 mg/dL — ABNORMAL HIGH (ref 65–99)
Potassium: 4.2 mmol/L (ref 3.5–5.1)
Sodium: 138 mmol/L (ref 135–145)

## 2017-11-07 LAB — CBC
HCT: 37.7 % — ABNORMAL LOW (ref 39.0–52.0)
Hemoglobin: 12.1 g/dL — ABNORMAL LOW (ref 13.0–17.0)
MCH: 27.8 pg (ref 26.0–34.0)
MCHC: 32.1 g/dL (ref 30.0–36.0)
MCV: 86.5 fL (ref 78.0–100.0)
Platelets: 157 10*3/uL (ref 150–400)
RBC: 4.36 MIL/uL (ref 4.22–5.81)
RDW: 14.1 % (ref 11.5–15.5)
WBC: 9.1 10*3/uL (ref 4.0–10.5)

## 2017-11-07 NOTE — Evaluation (Signed)
Physical Therapy Evaluation Patient Details Name: Kevin Villa MRN: [id number] DOB: 19-Feb-1956 Today's Date: 11/07/2017   History of Present Illness  Pt s/p R THR  Clinical Impression  Pt s/p R THR and presents with decreased R LE strength/ROM and post op pain limiting functional mobility.  Pt should progress to dc home with family assist.    Follow Up Recommendations Follow surgeon's recommendation for DC plan and follow-up therapies    Equipment Recommendations  3in1 (PT)    Recommendations for Other Services       Precautions / Restrictions Precautions Precautions: Fall Restrictions Weight Bearing Restrictions: No Other Position/Activity Restrictions: WBAT      Mobility  Bed Mobility Overal bed mobility: Needs Assistance Bed Mobility: Supine to Sit     Supine to sit: Min assist     General bed mobility comments: cues for sequence and use of L LE to self assist  Transfers Overall transfer level: Needs assistance Equipment used: Rolling walker (2 wheeled) Transfers: Sit to/from Stand Sit to Stand: Min assist;Min guard         General transfer comment: cues for LE management and use of UEs to self assist  Ambulation/Gait Ambulation/Gait assistance: Min assist;Min guard Ambulation Distance (Feet): 100 Feet Assistive device: Rolling walker (2 wheeled) Gait Pattern/deviations: Step-to pattern;Shuffle;Trunk flexed Gait velocity: decr   General Gait Details: cues for sequence, posture, R knee ext, R knee contact and position from RW.    Stairs            Wheelchair Mobility    Modified Rankin (Stroke Patients Only)       Balance Overall balance assessment: No apparent balance deficits (not formally assessed)                                           Pertinent Vitals/Pain Pain Assessment: 0-10 Pain Score: 5  Pain Location: R hip Pain Descriptors / Indicators: Aching;Sore Pain Intervention(s): Limited activity within  patient's tolerance;Monitored during session;Premedicated before session;Ice applied    Home Living Family/patient expects to be discharged to:: Private residence Living Arrangements: Other relatives Available Help at Discharge: Family Type of Home: House Home Access: Ramped entrance     Home Layout: One level Home Equipment: Crutches;Walker - 2 wheels      Prior Function Level of Independence: Independent with assistive device(s)         Comments: on crutches 2* hip pain     Hand Dominance        Extremity/Trunk Assessment   Upper Extremity Assessment Upper Extremity Assessment: Overall WFL for tasks assessed    Lower Extremity Assessment Lower Extremity Assessment: RLE deficits/detail RLE Deficits / Details: 2/5 strength at hip with AAROM at hip to 75 flex and 10 abd - pain limited.  Pt unable to fully extend hip or knee 2* pain and states that was similar prior to surgery    Cervical / Trunk Assessment Cervical / Trunk Assessment: Normal  Communication   Communication: No difficulties  Cognition Arousal/Alertness: Awake/alert Behavior During Therapy: WFL for tasks assessed/performed Overall Cognitive Status: Within Functional Limits for tasks assessed                                        General Comments      Exercises Total  Joint Exercises Ankle Circles/Pumps: AROM;Both;Supine;20 reps Quad Sets: AROM;Other (comment);Limitations(1 rep) Quad Sets Limitations: pain   Assessment/Plan    PT Assessment Patient needs continued PT services  PT Problem List Decreased strength;Decreased range of motion;Decreased activity tolerance;Decreased mobility;Decreased knowledge of use of DME;Pain       PT Treatment Interventions DME instruction;Gait training;Stair training;Functional mobility training;Therapeutic activities;Therapeutic exercise;Patient/family education    PT Goals (Current goals can be found in the Care Plan section)  Acute Rehab  PT Goals Patient Stated Goal: Get back to golfing PT Goal Formulation: With patient Time For Goal Achievement: 11/14/17 Potential to Achieve Goals: Good    Frequency 7X/week   Barriers to discharge        Co-evaluation               AM-PAC PT "6 Clicks" Daily Activity  Outcome Measure Difficulty turning over in bed (including adjusting bedclothes, sheets and blankets)?: A Lot Difficulty moving from lying on back to sitting on the side of the bed? : A Lot Difficulty sitting down on and standing up from a chair with arms (e.g., wheelchair, bedside commode, etc,.)?: A Lot Help needed moving to and from a bed to chair (including a wheelchair)?: A Little Help needed walking in hospital room?: A Little Help needed climbing 3-5 steps with a railing? : A Little 6 Click Score: 15    End of Session   Activity Tolerance: Patient limited by pain;Patient tolerated treatment well Patient left: in chair;with call bell/phone within reach Nurse Communication: Mobility status PT Visit Diagnosis: Difficulty in walking, not elsewhere classified (R26.2);Pain Pain - Right/Left: Right Pain - part of body: Hip    Time: [id number] PT Time Calculation (min) (ACUTE ONLY): 27 min   Charges:   PT Evaluation $PT Eval Low Complexity: 1 Low PT Treatments $Gait Training: 8-22 mins   PT G Codes:        Pg [phone]   Victoriah Wilds 11/07/2017, 11:11 AM

## 2017-11-07 NOTE — Progress Notes (Signed)
     Subjective: 1 Day Post-Op Procedure(s) (LRB): RIGHT TOTAL HIP ARTHROPLASTY ANTERIOR APPROACH (Right)   Patient reports pain as mild, pain controlled with medication.  No events throughout the night.  Looking forward to progressing and getting his life back.  Ready to be discharged home.    Objective:   VITALS:   Vitals:   11/07/17 0644 11/07/17 0901  BP: (!) 134/96 116/71  Pulse: 62 77  Resp: 18 14  Temp: 98.3 F (36.8 C) 98.4 F (36.9 C)  SpO2: 99% 97%    Dorsiflexion/Plantar flexion intact Incision: dressing C/D/I No cellulitis present Compartment soft  LABS Recent Labs    11/07/17 0547  HGB 12.1*  HCT 37.7*  WBC 9.1  PLT 157    Recent Labs    11/07/17 0547  NA 138  K 4.2  BUN 20  CREATININE 0.96  GLUCOSE 133*     Assessment/Plan: 1 Day Post-Op Procedure(s) (LRB): RIGHT TOTAL HIP ARTHROPLASTY ANTERIOR APPROACH (Right) Foley cath d/c'ed Advance diet Up with therapy D/C IV fluids Discharge home [facility] Follow up with OLIN,Avyn Aden D in 2 weeks.  Contact information:  [facility]) [address], [address] [place] [id number] [phone]     Obese (BMI 30-39.9) Estimated body mass index is 30.27 kg/m as calculated from the following:   Height as of this encounter: 5\' 11"  (1.803 m).   Weight as of this encounter: 98.4 kg (217 lb). Patient also counseled that weight may inhibit the healing process Patient counseled that losing weight will help with future health issues       Anastasio AuerbachMatthew S. Emili Mcloughlin   PAC  11/07/2017, 10:02 AM

## 2017-11-07 NOTE — Progress Notes (Signed)
    Durable Medical Equipment  (From admission, onward)        Start     Ordered   11/07/17 0855  For home use only DME Bedside commode  Once    Question:  Patient needs a bedside commode to treat with the following condition  Answer:  Surgery, elective   11/07/17 0854   11/07/17 0851  For home use only DME 3 n 1  Once     11/07/17 0850    [facility] room. [phone]

## 2017-11-07 NOTE — Progress Notes (Signed)
Patient educated on discharge instructions. No questions or concerns.

## 2017-11-07 NOTE — Progress Notes (Signed)
Physical Therapy Treatment Patient Details Name: Kevin Villa MRN: [id number] DOB: Sep 28, 1955 Today's Date: 11/07/2017    History of Present Illness Pt s/p R THR    PT Comments    Pt progressing well with pm with marked improvement in pain control and activity tolerance.  Pt reviewed car transfers and home therex program with progression and written information provided.    Follow Up Recommendations  Follow surgeon's recommendation for DC plan and follow-up therapies     Equipment Recommendations  3in1 (PT)    Recommendations for Other Services       Precautions / Restrictions Precautions Precautions: Fall Restrictions Weight Bearing Restrictions: No Other Position/Activity Restrictions: WBAT    Mobility  Bed Mobility               General bed mobility comments: Pt up in chair and requests back to same  Transfers Overall transfer level: Needs assistance Equipment used: Rolling walker (2 wheeled) Transfers: Sit to/from Stand Sit to Stand: Min guard;Supervision         General transfer comment: cues for LE management and use of UEs to self assist  Ambulation/Gait Ambulation/Gait assistance: Min guard;Supervision Ambulation Distance (Feet): 150 Feet Assistive device: Rolling walker (2 wheeled) Gait Pattern/deviations: Step-to pattern;Shuffle;Trunk flexed Gait velocity: decr   General Gait Details: cues for sequence, posture, R knee ext, R knee contact and position from RW.     Stairs             Wheelchair Mobility    Modified Rankin (Stroke Patients Only)       Balance Overall balance assessment: No apparent balance deficits (not formally assessed)                                          Cognition Arousal/Alertness: Awake/alert Behavior During Therapy: WFL for tasks assessed/performed Overall Cognitive Status: Within Functional Limits for tasks assessed                                         Exercises Total Joint Exercises Ankle Circles/Pumps: AROM;Both;Supine;20 reps Quad Sets: AROM;Both;10 reps;Supine Heel Slides: AAROM;Right;20 reps;Supine Hip ABduction/ADduction: AAROM;Right;15 reps;Supine Long Arc Quad: AROM;Right;10 reps;Seated    General Comments        Pertinent Vitals/Pain Pain Assessment: 0-10 Pain Score: 3  Pain Location: R hip Pain Descriptors / Indicators: Aching;Sore Pain Intervention(s): Limited activity within patient's tolerance;Monitored during session;Premedicated before session    Home Living                      Prior Function            PT Goals (current goals can now be found in the care plan section) Acute Rehab PT Goals Patient Stated Goal: Get back to golfing PT Goal Formulation: With patient Time For Goal Achievement: 11/14/17 Potential to Achieve Goals: Good Progress towards PT goals: Progressing toward goals    Frequency    7X/week      PT Plan Current plan remains appropriate    Co-evaluation              AM-PAC PT "6 Clicks" Daily Activity  Outcome Measure  Difficulty turning over in bed (including adjusting bedclothes, sheets and blankets)?: A Lot Difficulty moving from lying on back to  sitting on the side of the bed? : A Lot Difficulty sitting down on and standing up from a chair with arms (e.g., wheelchair, bedside commode, etc,.)?: A Lot Help needed moving to and from a bed to chair (including a wheelchair)?: A Little Help needed walking in hospital room?: A Little Help needed climbing 3-5 steps with a railing? : A Little 6 Click Score: 15    End of Session Equipment Utilized During Treatment: Gait belt Activity Tolerance: Patient tolerated treatment well Patient left: in chair;with call bell/phone within reach Nurse Communication: Mobility status PT Visit Diagnosis: Difficulty in walking, not elsewhere classified (R26.2);Pain Pain - Right/Left: Right Pain - part of body: Hip     Time:  1610-9604 PT Time Calculation (min) (ACUTE ONLY): 30 min  Charges:  $Gait Training: 8-22 mins $Therapeutic Exercise: 8-22 mins                    G Codes:       Pg [phone]    Yarel Kilcrease 11/07/2017, 4:03 PM

## 2017-11-09 DIAGNOSIS — E669 Obesity, unspecified: Secondary | ICD-10-CM | POA: Diagnosis present

## 2017-11-12 NOTE — Discharge Summary (Signed)
Physician Discharge Summary  Patient ID: Kevin Villa MRN: [id number] DOB/AGE: 1955-07-26 62 y.o.  Admit date: 11/06/2017 Discharge date: 11/07/2017   Procedures:  Procedure(s) (LRB): RIGHT TOTAL HIP ARTHROPLASTY ANTERIOR APPROACH (Right)  Attending Physician:  Dr. Durene Romans   Admission Diagnoses:   Right hip primary OA / pain  Discharge Diagnoses:  Principal Problem:   S/P right THA, AA Active Problems:   Obese  Past Medical History:  Diagnosis Date  . Arthritis    osteoarthritis   . Asthma    childhood   . GERD (gastroesophageal reflux disease)    occ  . Hypertension   . Pneumonia    years ago   . Pre-diabetes     HPI:     Kevin Villa, 62 y.o. male, has a history of pain and functional disability in the right hip(s) due to arthritis and patient has failed non-surgical conservative treatments for greater than 12 weeks to include NSAID's and/or analgesics, use of assistive devices and activity modification.  Onset of symptoms was gradual starting ~2 years ago with gradually worsening course since that time.The patient noted no past surgery on the right hip(s).  Patient currently rates pain in the right hip at 10 out of 10 with activity. Patient has night pain, worsening of pain with activity and weight bearing, trendelenberg gait, pain that interfers with activities of daily living and pain with passive range of motion. Patient has evidence of periarticular osteophytes and joint space narrowing by imaging studies. This condition presents safety issues increasing the risk of falls. There is no current active infection.  Risks, benefits and expectations were discussed with the patient.  Risks including but not limited to the risk of anesthesia, blood clots, nerve damage, blood vessel damage, failure of the prosthesis, infection and up to and including death.  Patient understand the risks, benefits and expectations and wishes to proceed with surgery.   PCP: Aliene Beams, MD   Discharged Condition: good  Hospital Course:  Patient underwent the above stated procedure on 11/06/2017. Patient tolerated the procedure well and brought to the recovery room in good condition and subsequently to the floor.  POD #1 BP: 116/71 ; Pulse: 77 ; Temp: 98.4 F (36.9 C) ; Resp: 14 Patient reports pain as mild, pain controlled with medication.  No events throughout the night.  Looking forward to progressing and getting his life back.  Ready to be discharged home.  Dorsiflexion/plantar flexion intact, incision: dressing C/D/I, no cellulitis present and compartment soft.   LABS  Basename    HGB     12.1  HCT     37.7    Discharge Exam: General appearance: alert, cooperative and no distress Extremities: Homans sign is negative, no sign of DVT, no edema, redness or tenderness in the calves or thighs and no ulcers, gangrene or trophic changes  Disposition:  Home with follow up in 2 weeks   Follow-up Information    Durene Romans, MD. Schedule an appointment as soon as possible for a visit in 2 weeks.   Specialty:  Orthopedic Surgery Contact information: [address] [place] [address] [place] [address] [phone]           Discharge Instructions    Call MD / Call 911   Complete by:  As directed    If you experience chest pain or shortness of breath, CALL 911 and be transported to the hospital emergency room.  If you develope a fever above 101 F, pus (  white drainage) or increased drainage or redness at the wound, or calf pain, call your surgeon's office.   Change dressing   Complete by:  As directed    Maintain surgical dressing until follow up in the clinic. If the edges start to pull up, may reinforce with tape. If the dressing is no longer working, may remove and cover with gauze and tape, but must keep the area dry and clean.  Call with any questions or concerns.   Constipation Prevention   Complete by:  As directed    Drink plenty of fluids.  Prune  juice may be helpful.  You may use a stool softener, such as Colace (over the counter) 100 mg twice a day.  Use MiraLax (over the counter) for constipation as needed.   Diet - low sodium heart healthy   Complete by:  As directed    Discharge instructions   Complete by:  As directed    Maintain surgical dressing until follow up in the clinic. If the edges start to pull up, may reinforce with tape. If the dressing is no longer working, may remove and cover with gauze and tape, but must keep the area dry and clean.  Follow up in 2 weeks at [facility]. Call with any questions or concerns.   Increase activity slowly as tolerated   Complete by:  As directed    Weight bearing as tolerated with assist device (walker, cane, etc) as directed, use it as long as suggested by your surgeon or therapist, typically at least 4-6 weeks.   TED hose   Complete by:  As directed    Use stockings (TED hose) for 2 weeks on both leg(s).  You may remove them at night for sleeping.      Allergies as of 11/07/2017      Reactions   Peanut-containing Drug Products Other (See Comments)   And also in food. Mouth raw   Tomato Hives, Itching      Medication List    TAKE these medications   amLODipine 5 MG tablet Commonly known as:  NORVASC Take 1 tablet (5 mg total) by mouth daily.   aspirin 81 MG chewable tablet Commonly known as:  ASPIRIN CHILDRENS Chew 1 tablet (81 mg total) by mouth 2 (two) times daily. Take for 4 weeks, then resume regular dose.   docusate sodium 100 MG capsule Commonly known as:  COLACE Take 1 capsule (100 mg total) by mouth 2 (two) times daily.   ferrous sulfate 325 (65 FE) MG tablet Commonly known as:  FERROUSUL Take 1 tablet (325 mg total) by mouth 3 (three) times daily with meals.   HYDROcodone-acetaminophen 7.5-325 MG tablet Commonly known as:  NORCO Take 1-2 tablets by mouth every 4 (four) hours as needed for moderate pain.   methocarbamol 500 MG tablet Commonly  known as:  ROBAXIN Take 1 tablet (500 mg total) by mouth every 6 (six) hours as needed for muscle spasms.   polyethylene glycol packet Commonly known as:  MIRALAX / GLYCOLAX Take 17 g by mouth 2 (two) times daily.            Discharge Care Instructions  (From admission, onward)        Start     Ordered   11/07/17 0000  Change dressing    Comments:  Maintain surgical dressing until follow up in the clinic. If the edges start to pull up, may reinforce with tape. If the dressing is no longer working, may  remove and cover with gauze and tape, but must keep the area dry and clean.  Call with any questions or concerns.   11/07/17 1003       Signed: Anastasio AuerbachMatthew S. Lavanda Nevels   PA-C  11/12/2017, 1:14 PM

## 2017-12-05 ENCOUNTER — Telehealth: Payer: Self-pay | Admitting: Family Medicine

## 2017-12-05 NOTE — Telephone Encounter (Signed)
Patient left voicemail requesting a call back to phone # [phone] No answer/left generic msg to call office back.

## 2018-01-11 ENCOUNTER — Ambulatory Visit: Payer: Medicaid Other | Admitting: Family Medicine

## 2018-08-12 ENCOUNTER — Encounter (INDEPENDENT_AMBULATORY_CARE_PROVIDER_SITE_OTHER): Payer: Self-pay | Admitting: Internal Medicine

## 2018-08-12 DIAGNOSIS — R5383 Other fatigue: Secondary | ICD-10-CM | POA: Diagnosis not present

## 2018-08-12 DIAGNOSIS — I10 Essential (primary) hypertension: Secondary | ICD-10-CM | POA: Diagnosis not present

## 2018-08-12 DIAGNOSIS — Z13220 Encounter for screening for lipoid disorders: Secondary | ICD-10-CM | POA: Diagnosis not present

## 2018-08-12 DIAGNOSIS — E559 Vitamin D deficiency, unspecified: Secondary | ICD-10-CM | POA: Diagnosis not present

## 2018-10-14 ENCOUNTER — Ambulatory Visit (INDEPENDENT_AMBULATORY_CARE_PROVIDER_SITE_OTHER): Payer: Medicaid Other | Admitting: Internal Medicine

## 2018-10-14 DIAGNOSIS — I10 Essential (primary) hypertension: Secondary | ICD-10-CM | POA: Diagnosis not present

## 2018-10-14 DIAGNOSIS — E559 Vitamin D deficiency, unspecified: Secondary | ICD-10-CM | POA: Diagnosis not present

## 2018-11-19 DIAGNOSIS — E559 Vitamin D deficiency, unspecified: Secondary | ICD-10-CM | POA: Diagnosis not present

## 2018-11-19 DIAGNOSIS — I10 Essential (primary) hypertension: Secondary | ICD-10-CM | POA: Diagnosis not present

## 2018-11-19 DIAGNOSIS — R319 Hematuria, unspecified: Secondary | ICD-10-CM | POA: Diagnosis not present

## 2018-11-19 DIAGNOSIS — E785 Hyperlipidemia, unspecified: Secondary | ICD-10-CM | POA: Diagnosis not present

## 2019-01-20 DIAGNOSIS — I10 Essential (primary) hypertension: Secondary | ICD-10-CM | POA: Diagnosis not present

## 2019-01-20 DIAGNOSIS — E785 Hyperlipidemia, unspecified: Secondary | ICD-10-CM | POA: Diagnosis not present

## 2019-01-20 DIAGNOSIS — R5383 Other fatigue: Secondary | ICD-10-CM | POA: Diagnosis not present

## 2019-01-20 DIAGNOSIS — E559 Vitamin D deficiency, unspecified: Secondary | ICD-10-CM | POA: Diagnosis not present

## 2019-02-05 ENCOUNTER — Ambulatory Visit (INDEPENDENT_AMBULATORY_CARE_PROVIDER_SITE_OTHER): Payer: Medicare Other | Admitting: Urology

## 2019-02-05 DIAGNOSIS — R300 Dysuria: Secondary | ICD-10-CM

## 2019-02-05 DIAGNOSIS — R310 Gross hematuria: Secondary | ICD-10-CM | POA: Diagnosis not present

## 2019-02-06 ENCOUNTER — Other Ambulatory Visit (INDEPENDENT_AMBULATORY_CARE_PROVIDER_SITE_OTHER): Payer: Self-pay

## 2019-03-19 ENCOUNTER — Ambulatory Visit (INDEPENDENT_AMBULATORY_CARE_PROVIDER_SITE_OTHER): Payer: Medicare Other | Admitting: Urology

## 2019-03-19 DIAGNOSIS — R300 Dysuria: Secondary | ICD-10-CM

## 2019-03-19 DIAGNOSIS — R310 Gross hematuria: Secondary | ICD-10-CM

## 2019-04-21 ENCOUNTER — Other Ambulatory Visit: Payer: Self-pay

## 2019-04-21 ENCOUNTER — Encounter (INDEPENDENT_AMBULATORY_CARE_PROVIDER_SITE_OTHER): Payer: Self-pay | Admitting: Nurse Practitioner

## 2019-04-21 ENCOUNTER — Ambulatory Visit (INDEPENDENT_AMBULATORY_CARE_PROVIDER_SITE_OTHER): Payer: Medicare Other | Admitting: Nurse Practitioner

## 2019-04-21 VITALS — BP 148/88 | HR 80 | Temp 96.4°F | Resp 14 | Ht 71.0 in | Wt 231.6 lb

## 2019-04-21 DIAGNOSIS — R319 Hematuria, unspecified: Secondary | ICD-10-CM

## 2019-04-21 DIAGNOSIS — K5900 Constipation, unspecified: Secondary | ICD-10-CM

## 2019-04-21 DIAGNOSIS — E785 Hyperlipidemia, unspecified: Secondary | ICD-10-CM | POA: Diagnosis not present

## 2019-04-21 DIAGNOSIS — E559 Vitamin D deficiency, unspecified: Secondary | ICD-10-CM | POA: Insufficient documentation

## 2019-04-21 DIAGNOSIS — R251 Tremor, unspecified: Secondary | ICD-10-CM

## 2019-04-21 DIAGNOSIS — I10 Essential (primary) hypertension: Secondary | ICD-10-CM

## 2019-04-21 MED ORDER — POLYETHYLENE GLYCOL 3350 17 GM/SCOOP PO POWD: 17.0000 g | Freq: Two times a day (BID) | ORAL | 1 refills | Status: DC | PRN

## 2019-04-21 MED ORDER — PRAVASTATIN SODIUM 20 MG PO TABS: 20.0000 mg | ORAL_TABLET | Freq: Every evening | ORAL | 1 refills | Status: AC

## 2019-04-21 NOTE — Assessment & Plan Note (Addendum)
Per your request we will refer you to another urologist for further evaluation.  Urinalysis in office today did not show signs of infection at this time.  I will still send urine off for culture and sensitivity, and may need to start antibiotic based on these results.  I did tell the patient to call the office if he experiences any fevers, severe flank pain, or other concerns.  He tells me he understands.

## 2019-04-21 NOTE — Assessment & Plan Note (Signed)
We will start him on pravastatin 20 mg daily to see how he tolerates this.  He will return in 1 month at which point we will need to recheck his lipid panel as well as complete metabolic panel.  He was encouraged to call this office any questions or concerns prior to his next upcoming appointment.

## 2019-04-21 NOTE — Assessment & Plan Note (Signed)
Patient will continue on supplement at this time.  We will continue to monitor serum levels as appropriate.

## 2019-04-21 NOTE — Patient Instructions (Signed)
Thank you for choosing [facility] as your medical provider! If you have any questions or concerns regarding your health care, please do not hesitate to call our office.  What we discussed today: 1.  Blood in urine: I have checked her urine today and it showed microscopic blood in your urine and protein in your urine, but no white blood cells or other signs of current infection.  I will still send her urine off for culture and if we see bacterial growth I will call you to prescribe an antibiotic.  As stated in the office if you experience fevers, severe flank pain, worsening dysuria please call this office immediately.  I will also be referring you to another urologist for a second opinion.  If you do not hear about scheduling this appointment within the next 2 weeks, please call this office to let us know. 2.  Vitamin D deficiency: Continue taking your vitamin D supplement as prescribed. 3.  Hypertension: Continue taking your medication daily.  Please check your blood pressure daily and write this down.  As we stated if you see a blood pressure greater than 150 over greater than 100 on 2 blood pressure checks please call this office. 4.  Hyperlipidemia: Start taking her pravastatin daily every evening.  We will recheck her lipid panel in 1 month. 5.  Abdominal pain: I am glad this is better.  Call me if this returns. 6.  Tremors: As stated in the office, please call if tremors worsen, occur more frequently during the day, are associated with weakness or pain.  Please follow-up as scheduled in 1 month. We look forward to seeing you again soon! Have a great Thanksgiving!!  At [facility] we value your feedback. You may receive a survey about your visit today. Please share your experience as we strive to create trusting relationships with our patients to provide genuine, compassionate, quality care.  We appreciate your understanding and patience as we review any laboratory studies,  imaging, and other diagnostic tests that are ordered as we care for you. We do our best to address any and all results in a timely manner. If you do not hear about test results within 1 week, please do not hesitate to contact us. If we referred you to a specialist during your visit or ordered imaging testing, contact the office if you have not been contacted to be scheduled within 1 weeks.  We also encourage the use of MyChart, which contains your medical information for your review as well. If you are not enrolled in this feature, an access code is on this after visit summary for your convenience. Thank you for allowing [place] to be involved in your care.

## 2019-04-21 NOTE — Assessment & Plan Note (Signed)
Based on exam findings and history, I believe this is a benign process.  I did tell the patient to call the office if the tremors occur more frequently, and if he starts to experience any associated symptoms such as weakness, changes in his functional capabilities, numbness, or pain.  He tells me he understands.

## 2019-04-21 NOTE — Assessment & Plan Note (Signed)
Patient to continue on his medication as prescribed.  I am not changing his medication regimen today as he did not take his medicine this morning which may explain why it is elevated.  I encouraged him to check his blood pressure every day and to write this down.  I did explain that his goal is to keep his blood pressure less than 140 over less than 90 and that if he were to see 2 readings of greater than 150 over greater than 100 he needs to call the office right away.  He tells me he understands.  We will discuss this again at next office visit when he has at home readings with him.

## 2019-04-21 NOTE — Progress Notes (Signed)
Subjective:  Patient ID: Kevin Villa, male    DOB: 08-Dec-1955  Age: 63 y.o. MRN: [id number]  CC:  Chief Complaint  Patient presents with  . other    "Shaking"  . Hematuria  . other    vitamin D deficiency  . Hypertension  . Hyperlipidemia  . Abdominal Pain      HPI  Hematuria: He has had a history of hematuria in the past both microscopic and gross.  He tells me he has seen a urologist which we did refer him to in the past.  He was not satisfied with the treatment plan without urologist.  He tells me he was placed on medication which he thinks was for an infection, and did undergo some additional testing.  However, he would like to see another urologist for a second opinion.  I do not see records from the last urologist appointment to look over.  He does tell me that he saw some hematuria last week, but this has resolved.  He also does state he has some frequency and urgency as well as intermittent dysuria.  He denies any fevers.  Vitamin D Deficiency: He continues on his vitamin D3 supplementation.  He tells me he is taking 6000 IUs daily.  Last serum level was collected in August of this year and was 24.  Hypertension: He takes amlodipine 5 mg daily.  He tells me he takes this inconsistently due to forgetting it at times.  He did not take this medication today.  He tolerates it well.  He does have a blood pressure cuff at home which he does check his blood pressure at times on, but cannot remember recent readings.  Hyperlipidemia: He was on a statin in the past but this was stopped.  Last lipid panel was collected in March of this year.  It did show LDL of 113.  Abdominal Pain: He tells me his abdominal pain has not resolved  "Shaking": He reports some intermittent tremors to his left arm.  He tells me these tremors only occur upon waking up in the morning.  They seem to resolve on their own within 20 minutes of onset.  He notices that drinking coffee or sleeping on his  left side can make the tremors worse.  I have been occurring for greater than 1 year.  He has not noticed any functional changes such as weakness, numbness, or difficulty completing tasks with his left arm.  He tells me drinking water seems to help the tremors go away.  He would also like a refill on his MiraLAX.  Past Medical History:  Diagnosis Date  . Arthritis    osteoarthritis   . Asthma    childhood   . GERD (gastroesophageal reflux disease)    occ  . Hematuria   . Hyperlipidemia   . Hypertension   . Pneumonia    years ago   . Pre-diabetes       Family History  Problem Relation Age of Onset  . Diabetes Mother   . Hypertension Mother   . Hypertension Father   . Cancer Brother        Unsure of type  . Hypertension Sister   . Diabetes Sister   . Hypertension Sister   . Hypertension Sister   . Hypertension Sister     Social History   Social History Narrative   Live in [place], [place]. On disability for hip pain.   Divorced, not dating.   No children.  Has friends and family.   Lives with mother or lives in his camper.   No pets.   Drinks about a 12 pack a weekend. Does not drink on a daily basis.    Does not drive b/c they expired and did not pay to get them renewed.    4 brothers and 4 sisters.       Social History   Tobacco Use  . Smoking status: Former Smoker    Packs/day: 0.25    Years: 30.00    Pack years: 7.50    Types: Cigarettes    Quit date: 2004    Years since quitting: 16.9  . Smokeless tobacco: Never Used  Substance Use Topics  . Alcohol use: Yes    Alcohol/week: 2.0 standard drinks    Types: 2 Cans of beer per week    Comment: 2 cans a day      Current Meds  Medication Sig  . amLODipine (NORVASC) 5 MG tablet Take 1 tablet (5 mg total) by mouth daily.  . Cholecalciferol (VITAMIN D3) 50 MCG (2000 UT) TABS Take 3 tablets by mouth.  . ferrous sulfate (FERROUSUL) 325 (65 FE) MG tablet Take 1 tablet (325 mg total) by mouth 3 (three) times  daily with meals.    ROS:  Review of Systems  Constitutional: Negative for chills, fever and weight loss.  Eyes: Negative for blurred vision.  Respiratory: Negative for cough and shortness of breath.   Cardiovascular: Negative for chest pain and palpitations.  Genitourinary: Positive for dysuria, hematuria and urgency. Negative for flank pain and frequency.  Musculoskeletal: Negative for falls.       Intermittent tremors of left arm     Objective:   Today's Vitals: BP (!) 148/88   Pulse 80   Temp (!) 96.4 F (35.8 C)   Resp 14   Ht 5\' 11"  (1.803 m)   Wt 231 lb 9.6 oz (105.1 kg)   SpO2 95%   BMI 32.30 kg/m  Vitals with BMI 04/21/2019 11/07/2017 11/07/2017  Height 5\' 11"  - -  Weight 231 lbs 10 oz - -  BMI 32.32 - -  Systolic 148 139 [id number]  Diastolic 88 76 71  Pulse 80 82 77     Physical Exam Vitals signs reviewed.  Constitutional:      General: He is not in acute distress.    Appearance: Normal appearance. He is well-developed. He is not ill-appearing.  HENT:     Head: Normocephalic and atraumatic.     Right Ear: Tympanic membrane, ear canal and external ear normal.     Left Ear: Tympanic membrane, ear canal and external ear normal.  Eyes:     Conjunctiva/sclera: Conjunctivae normal.  Neck:     Musculoskeletal: Normal range of motion and neck supple. No neck rigidity.     Vascular: No carotid bruit.  Cardiovascular:     Rate and Rhythm: Normal rate and regular rhythm.     Pulses: Normal pulses.     Heart sounds: Normal heart sounds.  Pulmonary:     Effort: Pulmonary effort is normal.     Breath sounds: Normal breath sounds.  Abdominal:     General: There is no distension.  Musculoskeletal: Normal range of motion.        General: No swelling or tenderness.     Left wrist: He exhibits normal range of motion and no tenderness.  Lymphadenopathy:     Cervical: No cervical adenopathy.  Skin:    General:  Skin is warm and dry.  Neurological:     General: No focal  deficit present.     Mental Status: He is alert and oriented to person, place, and time.     Cranial Nerves: No cranial nerve deficit.     Sensory: No sensory deficit.     Motor: No weakness.     Gait: Gait normal.  Psychiatric:        Mood and Affect: Mood normal.        Behavior: Behavior normal.        Judgment: Judgment normal.          Assessment   1. Constipation, unspecified constipation type   2. Hematuria, unspecified type   3. Hyperlipidemia, unspecified hyperlipidemia type   4. Tremor   5. Vitamin D deficiency   6. HTN, goal below 140/90       Tests ordered Orders Placed This Encounter  Procedures  . Urinalysis with Culture Reflex  . Ambulatory referral to Urology     Plan: Please see assessment and plan per problem list below.   Meds ordered this encounter  Medications  . polyethylene glycol powder (GLYCOLAX/MIRALAX) 17 GM/SCOOP powder    Sig: Take 17 g by mouth 2 (two) times daily as needed for mild constipation.    Dispense:  3350 g    Refill:  1    Order Specific Question:   Supervising Provider    Answer:   Anastasio Champion, NIMISH C [[id number]]  . pravastatin (PRAVACHOL) 20 MG tablet    Sig: Take 1 tablet (20 mg total) by mouth every evening.    Dispense:  90 tablet    Refill:  1    Order Specific Question:   Supervising Provider    Answer:   Doree Albee [[id number]]    Patient to follow-up in 1 month.  Ailene Ards, NP

## 2019-04-22 LAB — URINALYSIS W MICROSCOPIC + REFLEX CULTURE
Bacteria, UA: NONE SEEN /HPF
Bilirubin Urine: NEGATIVE
Glucose, UA: NEGATIVE
Hgb urine dipstick: NEGATIVE
Hyaline Cast: NONE SEEN /LPF
Ketones, ur: NEGATIVE
Leukocyte Esterase: NEGATIVE
Nitrites, Initial: NEGATIVE
Protein, ur: NEGATIVE
RBC / HPF: NONE SEEN /HPF (ref 0–2)
Specific Gravity, Urine: 1.024 (ref 1.001–1.03)
Squamous Epithelial / LPF: NONE SEEN /HPF (ref <=5)
WBC, UA: NONE SEEN /HPF (ref 0–5)
pH: <=5 (ref 5.0–8.0)

## 2019-04-22 LAB — NO CULTURE INDICATED

## 2019-04-30 ENCOUNTER — Ambulatory Visit: Payer: Medicare Other | Admitting: Urology

## 2019-05-21 ENCOUNTER — Other Ambulatory Visit: Payer: Self-pay

## 2019-05-21 ENCOUNTER — Encounter: Payer: Self-pay | Admitting: Urology

## 2019-05-21 ENCOUNTER — Ambulatory Visit (INDEPENDENT_AMBULATORY_CARE_PROVIDER_SITE_OTHER): Payer: Medicare Other | Admitting: Urology

## 2019-05-21 VITALS — BP 147/82 | HR 86 | Temp 96.4°F | Ht 71.0 in | Wt 213.0 lb

## 2019-05-21 DIAGNOSIS — R300 Dysuria: Secondary | ICD-10-CM | POA: Insufficient documentation

## 2019-05-21 DIAGNOSIS — R319 Hematuria, unspecified: Secondary | ICD-10-CM | POA: Diagnosis not present

## 2019-05-21 LAB — POCT URINALYSIS DIPSTICK
Bilirubin, UA: NEGATIVE
Blood, UA: POSITIVE
Glucose, UA: NEGATIVE
Ketones, UA: NEGATIVE
Leukocytes, UA: NEGATIVE
Nitrite, UA: NEGATIVE
Protein, UA: POSITIVE — AB
Spec Grav, UA: >=1.03 — AB (ref 1.010–1.025)
Urobilinogen, UA: NEGATIVE E.U./dL — AB
pH, UA: 5 (ref 5.0–8.0)

## 2019-05-21 MED ORDER — CIPROFLOXACIN HCL 500 MG PO TABS
500.0000 mg | ORAL_TABLET | Freq: Once | ORAL | Status: AC
  Administered 2019-05-21: 500 mg via ORAL

## 2019-05-21 MED ORDER — DOXYCYCLINE HYCLATE 100 MG PO CAPS: 100.0000 mg | ORAL_CAPSULE | Freq: Two times a day (BID) | ORAL | 0 refills | Status: DC

## 2019-05-21 NOTE — Progress Notes (Signed)
05/21/2019 9:57 AM   Kevin Villa [birth year] [id number]  Referring provider: Doree Albee, MD [address] [place],  [place] [address]  CC: Gross hematuria  HPI: Kevin Villa is a 63yo here for followup for gross hematuria and dysuria. Dysuria unimproved with 28 days of bactrim. No other LUTS. No other associated symptoms. Hematuria resolved with finasteride   PMH: Past Medical History:  Diagnosis Date  . Arthritis    osteoarthritis   . Asthma    childhood   . GERD (gastroesophageal reflux disease)    occ  . Hematuria   . Hyperlipidemia   . Hypertension   . Pneumonia    years ago   . Pre-diabetes     Surgical History: Past Surgical History:  Procedure Laterality Date  . HERNIA REPAIR    . TOTAL HIP ARTHROPLASTY Right 11/06/2017   Procedure: RIGHT TOTAL HIP ARTHROPLASTY ANTERIOR APPROACH;  Surgeon: Paralee Cancel, MD;  Location: [facility];  Service: Orthopedics;  Laterality: Right;  70 mins    Home Medications:  Allergies as of 05/21/2019      Reactions   Peanut-containing Drug Products Other (See Comments)   And also in food. Mouth raw   Tomato Hives, Itching      Medication List       Accurate as of May 21, 2019  9:57 AM. If you have any questions, ask your nurse or doctor.        STOP taking these medications   ferrous sulfate 325 (65 FE) MG tablet Commonly known as: FerrouSul Stopped by: Nicolette Bang, MD   polyethylene glycol powder 17 GM/SCOOP powder Commonly known as: GLYCOLAX/MIRALAX Stopped by: Nicolette Bang, MD   Vitamin D3 50 MCG (2000 UT) Tabs Stopped by: Nicolette Bang, MD     TAKE these medications   amLODipine 5 MG tablet Commonly known as: NORVASC Take 1 tablet (5 mg total) by mouth daily.   pravastatin 20 MG tablet Commonly known as: PRAVACHOL Take 1 tablet (20 mg total) by mouth every evening.       Allergies:  Allergies  Allergen Reactions  . Peanut-Containing Drug Products Other (See Comments)      And also in food. Mouth raw  . Tomato Hives and Itching    Family History: Family History  Problem Relation Age of Onset  . Diabetes Mother   . Hypertension Mother   . Hypertension Father   . Cancer Brother        Unsure of type  . Hypertension Sister   . Diabetes Sister   . Hypertension Sister   . Hypertension Sister   . Hypertension Sister     Social History:  reports that he quit smoking about 16 years ago. His smoking use included cigarettes. He has a 7.50 pack-year smoking history. He has never used smokeless tobacco. He reports current alcohol use of about 2.0 standard drinks of alcohol per week. He reports that he does not use drugs.  ROS:  Urological Symptom Review  Patient is experiencing the following symptoms: Burning/pain with urination   Review of Systems  Gastrointestinal (upper)  : Negative for upper GI symptoms  Gastrointestinal (lower) : Negative for lower GI symptoms  Constitutional : Negative for symptoms  Skin: Negative for skin symptoms  Eyes: Negative for eye symptoms  Ear/Nose/Throat : Negative for Ear/Nose/Throat symptoms  Hematologic/Lymphatic: Negative for Hematologic/Lymphatic symptoms  Cardiovascular : Negative for cardiovascular symptoms  Respiratory : Negative for respiratory symptoms  Endocrine: Negative for endocrine symptoms  Musculoskeletal: Negative for musculoskeletal symptoms  Neurological: Negative for neurological symptoms  Psychologic: Negative for psychiatric symptoms     05/21/19  CC: No chief complaint on file.   HPI:  Blood pressure (!) 147/82, pulse 86, temperature (!) 96.4 F (35.8 C), height 5\' 11"  (1.803 m), weight 213 lb (96.6 kg). NED. A&Ox3.   No respiratory distress   Abd soft, NT, ND Normal phallus with bilateral descended testicles  Cystoscopy Procedure Note  Patient identification was confirmed, informed consent was obtained, and patient was prepped using Betadine  solution.  Lidocaine jelly was administered per urethral meatus.     Pre-Procedure: - Inspection reveals a normal caliber ureteral meatus.  Procedure: The flexible cystoscope was introduced without difficulty - No urethral strictures/lesions are present. - Normal prostate bilateral lobar hyperplasia - Tight bladder neck - Bilateral ureteral orifices identified - Bladder mucosa  reveals no ulcers, tumors, or lesions - No bladder stones - mild trabeculation  Retroflexion shows no intravesical prostatic protrusion   Post-Procedure: - Patient tolerated the procedure well  Assessment/ Plan:   Return in about 4 weeks (around 06/18/2019).  Wilkie AyePatrick Birt Reinoso, MD                                         Physical Exam: BP (!) 147/82   Pulse 86   Temp (!) 96.4 F (35.8 C)   Ht 5\' 11"  (1.803 m)   Wt 213 lb (96.6 kg)   BMI 29.71 kg/m   Constitutional:  Alert and oriented, No acute distress. HEENT: [place] AT, moist mucus membranes.  Trachea midline, no masses. Cardiovascular: No clubbing, cyanosis, or edema. Respiratory: Normal respiratory effort, no increased work of breathing. GI: Abdomen is soft, nontender, nondistended, no abdominal masses GU: No CVA tenderness Lymph: No cervical or inguinal lymphadenopathy. Skin: No rashes, bruises or suspicious lesions. Neurologic: Grossly intact, no focal deficits, moving all 4 extremities. Psychiatric: Normal mood and affect.  Laboratory Data: Lab Results  Component Value Date   WBC 9.1 11/07/2017   HGB 12.1 (L) 11/07/2017   HCT 37.7 (L) 11/07/2017   MCV 86.5 11/07/2017   PLT 157 11/07/2017    Lab Results  Component Value Date   CREATININE 0.96 11/07/2017    No results found for: PSA  No results found for: TESTOSTERONE  Lab Results  Component Value Date   HGBA1C 5.6 11/01/2017    Urinalysis    Component Value Date/Time   COLORURINE YELLOW 04/21/2019 1040   APPEARANCEUR CLEAR 04/21/2019 1040    LABSPEC 1.024 04/21/2019 1040   PHURINE < OR = 5.0 04/21/2019 1040   GLUCOSEU NEGATIVE 04/21/2019 1040   HGBUR NEGATIVE 04/21/2019 1040   BILIRUBINUR negative 05/21/2019 0944   KETONESUR NEGATIVE 04/21/2019 1040   PROTEINUR Positive (A) 05/21/2019 0944   PROTEINUR NEGATIVE 04/21/2019 1040   UROBILINOGEN negative (A) 05/21/2019 0944   NITRITE negative 05/21/2019 0944   NITRITE NEGATIVE 06/11/2015 0350   LEUKOCYTESUR Negative 05/21/2019 0944    Lab Results  Component Value Date   BACTERIA NONE SEEN 04/21/2019    Pertinent Imaging: none No results found for this or any previous visit. No results found for this or any previous visit. No results found for this or any previous visit. No results found for this or any previous visit. No results found for this or any previous visit. No results found for this or any previous visit. No  results found for this or any previous visit. No results found for this or any previous visit.  Assessment & Plan:    1. Hematuria, unspecified type -continue finasteride and flomax - POCT urinalysis dipstick  2. Dysuria: -doxycycline 100mg  BID for 28 days -RTC 4-6 weeks   No follow-ups on file.  , MD  [facility]

## 2019-05-21 NOTE — Patient Instructions (Signed)

## 2019-06-03 ENCOUNTER — Ambulatory Visit (INDEPENDENT_AMBULATORY_CARE_PROVIDER_SITE_OTHER): Payer: Medicare Other | Admitting: Nurse Practitioner

## 2019-06-03 ENCOUNTER — Other Ambulatory Visit: Payer: Self-pay

## 2019-06-03 ENCOUNTER — Encounter (INDEPENDENT_AMBULATORY_CARE_PROVIDER_SITE_OTHER): Payer: Self-pay | Admitting: Nurse Practitioner

## 2019-06-03 VITALS — BP 132/76 | HR 87 | Temp 97.6°F | Resp 18 | Ht 67.0 in | Wt 216.0 lb

## 2019-06-03 DIAGNOSIS — H538 Other visual disturbances: Secondary | ICD-10-CM | POA: Diagnosis not present

## 2019-06-03 DIAGNOSIS — R251 Tremor, unspecified: Secondary | ICD-10-CM

## 2019-06-03 DIAGNOSIS — I10 Essential (primary) hypertension: Secondary | ICD-10-CM | POA: Diagnosis not present

## 2019-06-03 DIAGNOSIS — R319 Hematuria, unspecified: Secondary | ICD-10-CM | POA: Diagnosis not present

## 2019-06-03 DIAGNOSIS — E785 Hyperlipidemia, unspecified: Secondary | ICD-10-CM

## 2019-06-03 NOTE — Assessment & Plan Note (Signed)
Blood pressure much better today. I encouraged him to continue taking his medication as prescribed.

## 2019-06-03 NOTE — Assessment & Plan Note (Signed)
I will refer him to neurology for further evaluation and management of his hand tremors.

## 2019-06-03 NOTE — Assessment & Plan Note (Signed)
I will send in for blood work to collect complete metabolic panel as well as lipid panel for further evaluation. Further recommendations will be made based upon those results.

## 2019-06-03 NOTE — Patient Instructions (Signed)
Thank you for choosing [facility] as your medical provider! If you have any questions or concerns regarding your health care, please do not hesitate to call our office.  Let me know if and when you would like a referral to a different urologist for second opinion. I will refer you to eye doctor and neurologist for further evaluation of your blurry vision as well as your hand tremors. Continue all medications as prescribed. Please go to the lab sometime this week to get your blood work drawn.  Please follow-up as scheduled in 3 months. We look forward to seeing you again soon! Have a great Valentine's Day!!  At [facility] we value your feedback. You may receive a survey about your visit today. Please share your experience as we strive to create trusting relationships with our patients to provide genuine, compassionate, quality care.  We appreciate your understanding and patience as we review any laboratory studies, imaging, and other diagnostic tests that are ordered as we care for you. We do our best to address any and all results in a timely manner. If you do not hear about test results within 1 week, please do not hesitate to contact us. If we referred you to a specialist during your visit or ordered imaging testing, contact the office if you have not been contacted to be scheduled within 1 weeks.  We also encourage the use of MyChart, which contains your medical information for your review as well. If you are not enrolled in this feature, an access code is on this after visit summary for your convenience. Thank you for allowing [place] to be involved in your care.

## 2019-06-03 NOTE — Progress Notes (Signed)
Subjective:  Patient ID: Kevin Villa, male    DOB: 12/13/1955  Age: 64 y.o. MRN: [id number]  CC:  Chief Complaint  Patient presents with  . Follow-up      HPI  This patient arrives for follow-up of his chronic conditions.  Hematuria: He has had chronic intermittent hematuria and is being followed by urology for this. He does seem a bit disheartened and feels that a cause of the hematuria has not been determined. He tells me he sees his urologist soon.  Hypertension: He continues on his amlodipine daily.  Hyperlipidemia: At his last visit we started him on pravastatin. He tells me he has been taking this as prescribed and has been tolerating it well.  Tremors: As last visit he had mention that he had been having intermittent hand tremors. Today he tells me that the tremors are about the same and he would like to be seen by a neurologist for this.  Blurred vision: He also mentions that he has had some blurred vision and would like to be seen by a eye doctor because he thinks he needs glasses.  Past Medical History:  Diagnosis Date  . Arthritis    osteoarthritis   . Asthma    childhood   . GERD (gastroesophageal reflux disease)    occ  . Hematuria   . Hyperlipidemia   . Hypertension   . Pneumonia    years ago   . Pre-diabetes       Family History  Problem Relation Age of Onset  . Diabetes Mother   . Hypertension Mother   . Hypertension Father   . Cancer Brother        Unsure of type  . Hypertension Sister   . Diabetes Sister   . Hypertension Sister   . Hypertension Sister   . Hypertension Sister     Social History   Social History Narrative   Live in [place], [place]. On disability for hip pain.   Divorced, not dating.   No children.   Has friends and family.   Lives with mother or lives in his camper.   No pets.   Drinks about a 12 pack a weekend. Does not drink on a daily basis.    Does not drive b/c they expired and did not pay to get them  renewed.    4 brothers and 4 sisters.       Social History   Tobacco Use  . Smoking status: Former Smoker    Packs/day: 0.25    Years: 30.00    Pack years: 7.50    Types: Cigarettes    Quit date: 2004    Years since quitting: 17.0  . Smokeless tobacco: Never Used  Substance Use Topics  . Alcohol use: Yes    Alcohol/week: 2.0 standard drinks    Types: 2 Cans of beer per week    Comment: 2 cans a day      Current Meds  Medication Sig  . amLODipine (NORVASC) 5 MG tablet Take 1 tablet (5 mg total) by mouth daily.  Marland Kitchen doxycycline (VIBRAMYCIN) 100 MG capsule Take 1 capsule (100 mg total) by mouth 2 (two) times daily.  . pravastatin (PRAVACHOL) 20 MG tablet Take 1 tablet (20 mg total) by mouth every evening.    ROS:  Review of Systems  Constitutional: Negative for fever and malaise/fatigue.  Eyes: Positive for blurred vision.  Respiratory: Negative.   Cardiovascular: Negative.   Gastrointestinal: Negative.  Genitourinary: Positive for dysuria. Negative for hematuria.  Neurological: Positive for tremors.     Objective:   Today's Vitals: BP 132/76 (BP Location: Left Arm, Patient Position: Sitting, Cuff Size: Normal)   Pulse 87   Temp 97.6 F (36.4 C) (Temporal)   Resp 18   Ht 5\' 7"  (1.702 m)   Wt 216 lb (98 kg)   SpO2 97% Comment: wearing mask.  BMI 33.83 kg/m  Vitals with BMI 06/03/2019 05/21/2019 04/21/2019  Height 5\' 7"  5\' 11"  5\' 11"   Weight 216 lbs 213 lbs 231 lbs 10 oz  BMI 33.82 65.78 46.96  Systolic 295 284 132  Diastolic 76 82 88  Pulse 87 86 80     Physical Exam Vitals reviewed.  Constitutional:      Appearance: Normal appearance.  HENT:     Head: Normocephalic and atraumatic.  Cardiovascular:     Rate and Rhythm: Normal rate and regular rhythm.  Pulmonary:     Effort: Pulmonary effort is normal.     Breath sounds: Normal breath sounds.  Musculoskeletal:     Cervical back: Neck supple.  Skin:    General: Skin is warm and dry.  Neurological:       Mental Status: He is alert and oriented to person, place, and time.     Motor: Tremor present.  Psychiatric:        Mood and Affect: Mood normal.        Behavior: Behavior normal.        Thought Content: Thought content normal.        Judgment: Judgment normal.          Assessment   No diagnosis found.    Tests ordered No orders of the defined types were placed in this encounter.    Plan: Please see assessment and plan per problem list below.   No orders of the defined types were placed in this encounter.   Patient to follow-up in 3 months.  Ailene Ards, NP

## 2019-06-03 NOTE — Assessment & Plan Note (Signed)
I will refer him to optometry for further evaluation of his blurred vision to see if he requires glasses.

## 2019-06-03 NOTE — Assessment & Plan Note (Signed)
I encouraged him to follow-up with his urologist as scheduled. I offered to send him to a another urologist for second opinion as he does not appear to be completely satisfied with current urologist, however he tells me he would like to work with his current urologist little bit longer before seeing another one. I encouraged him to let me know if he does decide he would like a second opinion.

## 2019-06-10 ENCOUNTER — Telehealth (INDEPENDENT_AMBULATORY_CARE_PROVIDER_SITE_OTHER): Payer: Self-pay

## 2019-06-10 NOTE — Telephone Encounter (Signed)
Done -neourology

## 2019-06-25 DIAGNOSIS — G20 Parkinson's disease: Secondary | ICD-10-CM | POA: Diagnosis not present

## 2019-06-25 DIAGNOSIS — R2681 Unsteadiness on feet: Secondary | ICD-10-CM | POA: Diagnosis not present

## 2019-06-25 DIAGNOSIS — G5602 Carpal tunnel syndrome, left upper limb: Secondary | ICD-10-CM | POA: Diagnosis not present

## 2019-06-25 DIAGNOSIS — I10 Essential (primary) hypertension: Secondary | ICD-10-CM | POA: Diagnosis not present

## 2019-07-02 ENCOUNTER — Ambulatory Visit (INDEPENDENT_AMBULATORY_CARE_PROVIDER_SITE_OTHER): Payer: Medicare Other | Admitting: Urology

## 2019-07-02 ENCOUNTER — Other Ambulatory Visit: Payer: Self-pay

## 2019-07-02 ENCOUNTER — Encounter: Payer: Self-pay | Admitting: Urology

## 2019-07-02 VITALS — BP 162/83 | HR 80 | Temp 96.5°F | Ht 71.0 in | Wt 213.0 lb

## 2019-07-02 DIAGNOSIS — R300 Dysuria: Secondary | ICD-10-CM

## 2019-07-02 DIAGNOSIS — R319 Hematuria, unspecified: Secondary | ICD-10-CM | POA: Diagnosis not present

## 2019-07-02 MED ORDER — TAMSULOSIN HCL 0.4 MG PO CAPS: 0.4000 mg | ORAL_CAPSULE | Freq: Every day | ORAL | 3 refills | Status: DC

## 2019-07-02 NOTE — Progress Notes (Signed)

## 2019-07-02 NOTE — Progress Notes (Signed)
07/02/2019 10:28 AM   Kevin Villa July 10, 1955 [id number]  Referring provider: Wilson Singer, MD [address] [place],  [place] [address]  Dysuria   HPI: Kevin Villa is a 64yo here for followup for dysuria, prostatitis and BPH. Since last visit the dysuria resolved with 28 days of doxycycline. Stream is strong, no urgency, no frequency. Nocturia 0-1x. He is on flomax and finasteride. No hematuria. He has worsening ED since starting finasteride. BPH is chronic and stable.    PMH: Past Medical History:  Diagnosis Date  . Arthritis    osteoarthritis   . Asthma    childhood   . GERD (gastroesophageal reflux disease)    occ  . Hematuria   . Hyperlipidemia   . Hypertension   . Pneumonia    years ago   . Pre-diabetes     Surgical History: Past Surgical History:  Procedure Laterality Date  . HERNIA REPAIR    . TOTAL HIP ARTHROPLASTY Right 11/06/2017   Procedure: RIGHT TOTAL HIP ARTHROPLASTY ANTERIOR APPROACH;  Surgeon: Durene Romans, MD;  Location: [facility];  Service: Orthopedics;  Laterality: Right;  70 mins    Home Medications:  Allergies as of 07/02/2019      Reactions   Peanut-containing Drug Products Other (See Comments)   And also in food. Mouth raw   Tomato Hives, Itching      Medication List       Accurate as of July 02, 2019 10:28 AM. If you have any questions, ask your nurse or doctor.        amantadine 100 MG capsule Commonly known as: SYMMETREL   amLODipine 5 MG tablet Commonly known as: NORVASC Take 1 tablet (5 mg total) by mouth daily.   doxycycline 100 MG capsule Commonly known as: VIBRAMYCIN Take 1 capsule (100 mg total) by mouth 2 (two) times daily.   pravastatin 20 MG tablet Commonly known as: PRAVACHOL Take 1 tablet (20 mg total) by mouth every evening.       Allergies:  Allergies  Allergen Reactions  . Peanut-Containing Drug Products Other (See Comments)    And also in food. Mouth raw  . Tomato Hives and Itching     Family History: Family History  Problem Relation Age of Onset  . Diabetes Mother   . Hypertension Mother   . Hypertension Father   . Cancer Brother        Unsure of type  . Hypertension Sister   . Diabetes Sister   . Hypertension Sister   . Hypertension Sister   . Hypertension Sister     Social History:  reports that he quit smoking about 17 years ago. His smoking use included cigarettes. He has a 7.50 pack-year smoking history. He has never used smokeless tobacco. He reports current alcohol use of about 2.0 standard drinks of alcohol per week. He reports that he does not use drugs.  ROS: All other review of systems were reviewed and are negative except what is noted above in HPI  Physical Exam: BP (!) 162/83   Pulse 80   Temp (!) 96.5 F (35.8 C)   Ht 5\' 11"  (1.803 m)   Wt 213 lb (96.6 kg)   BMI 29.71 kg/m   Constitutional:  Alert and oriented, No acute distress. HEENT: [place] AT, moist mucus membranes.  Trachea midline, no masses. Cardiovascular: No clubbing, cyanosis, or edema. Respiratory: Normal respiratory effort, no increased work of breathing. GI: Abdomen is soft, nontender, nondistended, no abdominal masses  GU: No CVA tenderness Lymph: No cervical or inguinal lymphadenopathy. Skin: No rashes, bruises or suspicious lesions. Neurologic: Grossly intact, no focal deficits, moving all 4 extremities. Psychiatric: Normal mood and affect.  Laboratory Data: Lab Results  Component Value Date   WBC 9.1 11/07/2017   HGB 12.1 (L) 11/07/2017   HCT 37.7 (L) 11/07/2017   MCV 86.5 11/07/2017   PLT 157 11/07/2017    Lab Results  Component Value Date   CREATININE 0.96 11/07/2017    No results found for: PSA  No results found for: TESTOSTERONE  Lab Results  Component Value Date   HGBA1C 5.6 11/01/2017    Urinalysis    Component Value Date/Time   COLORURINE YELLOW 04/21/2019 1040   APPEARANCEUR CLEAR 04/21/2019 1040   LABSPEC 1.024 04/21/2019 1040   PHURINE  < OR = 5.0 04/21/2019 1040   GLUCOSEU NEGATIVE 04/21/2019 1040   HGBUR NEGATIVE 04/21/2019 1040   BILIRUBINUR negative 05/21/2019 [place] 04/21/2019 1040   PROTEINUR Positive (A) 05/21/2019 0944   PROTEINUR NEGATIVE 04/21/2019 1040   UROBILINOGEN negative (A) 05/21/2019 0944   NITRITE negative 05/21/2019 0944   NITRITE NEGATIVE 06/11/2015 0350   LEUKOCYTESUR Negative 05/21/2019 0944    Lab Results  Component Value Date   BACTERIA NONE SEEN 04/21/2019    Pertinent Imaging:  No results found for this or any previous visit. No results found for this or any previous visit. No results found for this or any previous visit. No results found for this or any previous visit. No results found for this or any previous visit. No results found for this or any previous visit. No results found for this or any previous visit. No results found for this or any previous visit.  Assessment & Plan:    1. Hematuria, unspecified type -continue flomax -stop finasteride due to sexal side effects  2. Dysuria -resolved   Return in about 3 months (around 09/29/2019).  Nicolette Bang, MD  [facility]

## 2019-07-02 NOTE — Patient Instructions (Signed)

## 2019-07-10 ENCOUNTER — Encounter: Payer: Self-pay | Admitting: Internal Medicine

## 2019-07-10 DIAGNOSIS — G5602 Carpal tunnel syndrome, left upper limb: Secondary | ICD-10-CM | POA: Diagnosis not present

## 2019-07-10 DIAGNOSIS — G5601 Carpal tunnel syndrome, right upper limb: Secondary | ICD-10-CM | POA: Diagnosis not present

## 2019-08-25 DIAGNOSIS — G5603 Carpal tunnel syndrome, bilateral upper limbs: Secondary | ICD-10-CM | POA: Diagnosis not present

## 2019-08-25 DIAGNOSIS — G20 Parkinson's disease: Secondary | ICD-10-CM | POA: Diagnosis not present

## 2019-08-25 DIAGNOSIS — I10 Essential (primary) hypertension: Secondary | ICD-10-CM | POA: Diagnosis not present

## 2019-08-25 DIAGNOSIS — R2681 Unsteadiness on feet: Secondary | ICD-10-CM | POA: Diagnosis not present

## 2019-09-09 ENCOUNTER — Ambulatory Visit (INDEPENDENT_AMBULATORY_CARE_PROVIDER_SITE_OTHER): Payer: Medicare Other | Admitting: Nurse Practitioner

## 2019-10-01 ENCOUNTER — Ambulatory Visit: Payer: Medicare Other | Admitting: Urology

## 2019-10-28 ENCOUNTER — Other Ambulatory Visit (INDEPENDENT_AMBULATORY_CARE_PROVIDER_SITE_OTHER): Payer: Self-pay | Admitting: Nurse Practitioner

## 2019-10-28 DIAGNOSIS — E785 Hyperlipidemia, unspecified: Secondary | ICD-10-CM

## 2019-11-17 ENCOUNTER — Other Ambulatory Visit (INDEPENDENT_AMBULATORY_CARE_PROVIDER_SITE_OTHER): Payer: Self-pay | Admitting: Nurse Practitioner

## 2019-11-17 ENCOUNTER — Other Ambulatory Visit (INDEPENDENT_AMBULATORY_CARE_PROVIDER_SITE_OTHER): Payer: Self-pay

## 2019-11-17 DIAGNOSIS — E785 Hyperlipidemia, unspecified: Secondary | ICD-10-CM

## 2019-11-18 DIAGNOSIS — H5213 Myopia, bilateral: Secondary | ICD-10-CM | POA: Diagnosis not present

## 2019-11-18 DIAGNOSIS — H35033 Hypertensive retinopathy, bilateral: Secondary | ICD-10-CM | POA: Diagnosis not present

## 2020-03-03 ENCOUNTER — Emergency Department (HOSPITAL_COMMUNITY)
Admission: EM | Admit: 2020-03-03 | Discharge: 2020-03-03 | Discharge status: Against Medical Advice | Payer: Medicare Other

## 2020-03-03 ENCOUNTER — Other Ambulatory Visit: Payer: Self-pay

## 2020-03-03 DIAGNOSIS — R531 Weakness: Secondary | ICD-10-CM | POA: Diagnosis not present

## 2020-03-03 DIAGNOSIS — Z91010 Allergy to peanuts: Secondary | ICD-10-CM | POA: Diagnosis not present

## 2020-03-03 DIAGNOSIS — I651 Occlusion and stenosis of basilar artery: Secondary | ICD-10-CM | POA: Diagnosis not present

## 2020-03-03 DIAGNOSIS — I10 Essential (primary) hypertension: Secondary | ICD-10-CM | POA: Diagnosis not present

## 2020-03-03 DIAGNOSIS — R202 Paresthesia of skin: Secondary | ICD-10-CM | POA: Diagnosis not present

## 2020-03-03 DIAGNOSIS — I6529 Occlusion and stenosis of unspecified carotid artery: Secondary | ICD-10-CM | POA: Diagnosis not present

## 2020-03-03 NOTE — ED Triage Notes (Signed)
Screener at front ED desk reported to RN that pt had left prior to being triaged.

## 2020-03-11 ENCOUNTER — Ambulatory Visit (INDEPENDENT_AMBULATORY_CARE_PROVIDER_SITE_OTHER): Payer: Medicare Other | Admitting: Internal Medicine

## 2020-03-11 ENCOUNTER — Other Ambulatory Visit: Payer: Self-pay

## 2020-03-11 ENCOUNTER — Encounter (INDEPENDENT_AMBULATORY_CARE_PROVIDER_SITE_OTHER): Payer: Self-pay | Admitting: Internal Medicine

## 2020-03-11 VITALS — BP 152/76 | HR 85 | Temp 96.9°F | Ht 70.0 in | Wt 236.4 lb

## 2020-03-11 DIAGNOSIS — K5900 Constipation, unspecified: Secondary | ICD-10-CM | POA: Diagnosis not present

## 2020-03-11 DIAGNOSIS — R251 Tremor, unspecified: Secondary | ICD-10-CM

## 2020-03-11 DIAGNOSIS — E559 Vitamin D deficiency, unspecified: Secondary | ICD-10-CM | POA: Diagnosis not present

## 2020-03-11 DIAGNOSIS — I10 Essential (primary) hypertension: Secondary | ICD-10-CM

## 2020-03-11 MED ORDER — AMLODIPINE BESYLATE 5 MG PO TABS: 5.0000 mg | ORAL_TABLET | Freq: Every day | ORAL | 1 refills | Status: DC

## 2020-03-11 MED ORDER — SENNOSIDES-DOCUSATE SODIUM 8.6-50 MG PO TABS: 1.0000 | ORAL_TABLET | Freq: Two times a day (BID) | ORAL | 3 refills | Status: DC

## 2020-03-11 NOTE — Progress Notes (Signed)
Metrics: Intervention Frequency ACO  Documented Smoking Status Yearly  Screened one or more times in 24 months  Cessation Counseling or  Active cessation medication Past 24 months  Past 24 months   Guideline developer: UpToDate (See UpToDate for funding source) Date Released: 2014       Wellness Office Visit  Subjective:  Patient ID: Kevin Villa, male    DOB: [birth year]  Age: 64 y.o. MRN: [id number]  CC: This man was recently seen in [facility] emergency room.  He presented with tremor of both his arms. HPI He underwent a CT scan of the brain which was somewhat abnormal with a CT angiogram.  It was recommended that he be followed up by neurology. He also has a tendency towards constipation.  Past Medical History:  Diagnosis Date  . Arthritis    osteoarthritis   . Asthma    childhood   . GERD (gastroesophageal reflux disease)    occ  . Hematuria   . Hyperlipidemia   . Hypertension   . Pneumonia    years ago   . Pre-diabetes    Past Surgical History:  Procedure Laterality Date  . HERNIA REPAIR    . TOTAL HIP ARTHROPLASTY Right 11/06/2017   Procedure: RIGHT TOTAL HIP ARTHROPLASTY ANTERIOR APPROACH;  Surgeon: Durene Romans, MD;  Location: [facility];  Service: Orthopedics;  Laterality: Right;  70 mins     Family History  Problem Relation Age of Onset  . Diabetes Mother   . Hypertension Mother   . Hypertension Father   . Cancer Brother        Unsure of type  . Hypertension Sister   . Diabetes Sister   . Hypertension Sister   . Hypertension Sister   . Hypertension Sister     Social History   Social History Narrative   Live in [place], [place]. On disability for hip pain.   Divorced, not dating.   No children.   Has friends and family.   Lives with mother or lives in his camper.   No pets.   Drinks about a 12 pack a weekend. Does not drink on a daily basis.    Does not drive b/c they expired and did not pay to get them renewed.    4 brothers and 4 sisters.        Social History   Tobacco Use  . Smoking status: Former Smoker    Packs/day: 0.25    Years: 30.00    Pack years: 7.50    Types: Cigarettes    Quit date: 2004    Years since quitting: 17.7  . Smokeless tobacco: Never Used  Substance Use Topics  . Alcohol use: Yes    Alcohol/week: 2.0 standard drinks    Types: 2 Cans of beer per week    Comment: 2 cans a day     Current Meds  Medication Sig  . amantadine (SYMMETREL) 100 MG capsule   . amLODipine (NORVASC) 5 MG tablet Take 1 tablet (5 mg total) by mouth daily.  . pravastatin (PRAVACHOL) 20 MG tablet Take 1 tablet (20 mg total) by mouth every evening.  . tamsulosin (FLOMAX) 0.4 MG CAPS capsule Take 1 capsule (0.4 mg total) by mouth daily.  . [DISCONTINUED] amLODipine (NORVASC) 5 MG tablet Take 1 tablet (5 mg total) by mouth daily.      Depression screen [facility] 2/9 10/10/2017 07/18/2017  Decreased Interest 3 0  Down, Depressed, Hopeless 0 0  PHQ - 2 Score 3  0  Altered sleeping 3 -  Tired, decreased energy 3 -  Change in appetite 0 -  Feeling bad or failure about yourself  0 -  Trouble concentrating 1 -  Moving slowly or fidgety/restless 0 -  Suicidal thoughts 0 -  PHQ-9 Score 10 -  Difficult doing work/chores Very difficult -     Objective:   Today's Vitals: BP (!) 152/76   Pulse 85   Temp (!) 96.9 F (36.1 C) (Temporal)   Ht 5\' 10"  (1.778 m)   Wt 236 lb 6.4 oz (107.2 kg)   SpO2 98%   BMI 33.92 kg/m  Vitals with BMI 03/11/2020 07/02/2019 06/03/2019  Height 5\' 10"  5\' 11"  5\' 7"   Weight 236 lbs 6 oz 213 lbs 216 lbs  BMI 33.92 29.72 33.82  Systolic 152 162 08/01/2019  Diastolic 76 83 76  Pulse 85 80 87     Physical Exam  He is alert and oriented without any obvious focal neurological signs.     Assessment   1. Tremor   2. HTN, goal below 140/90   3. Constipation, unspecified constipation type   4. Vitamin D deficiency       Tests ordered Orders Placed This Encounter  Procedures  . Ambulatory referral to  Neurology     Plan: 1. I will refer him to neurology for a second opinion in . 2. As far as constipation is concerned, I have recommended Senokot-S to see if this will help him. 3. Follow-up with in about 3 months time.   Meds ordered this encounter  Medications  . senna-docusate (SENOKOT-S) 8.6-50 MG tablet    Sig: Take 1 tablet by mouth 2 (two) times daily.    Dispense:  60 tablet    Refill:  3  . amLODipine (NORVASC) 5 MG tablet    Sig: Take 1 tablet (5 mg total) by mouth daily.    Dispense:  90 tablet    Refill:  1    Faiza Bansal 353, MD

## 2020-03-30 ENCOUNTER — Other Ambulatory Visit: Payer: Self-pay

## 2020-03-30 ENCOUNTER — Ambulatory Visit (INDEPENDENT_AMBULATORY_CARE_PROVIDER_SITE_OTHER): Payer: Medicare Other | Admitting: Nurse Practitioner

## 2020-03-30 ENCOUNTER — Encounter (INDEPENDENT_AMBULATORY_CARE_PROVIDER_SITE_OTHER): Payer: Self-pay | Admitting: Nurse Practitioner

## 2020-03-30 VITALS — BP 146/82 | HR 85 | Temp 97.1°F | Ht 70.0 in | Wt 241.0 lb

## 2020-03-30 DIAGNOSIS — R519 Headache, unspecified: Secondary | ICD-10-CM | POA: Diagnosis not present

## 2020-03-30 DIAGNOSIS — R29898 Other symptoms and signs involving the musculoskeletal system: Secondary | ICD-10-CM | POA: Diagnosis not present

## 2020-03-30 DIAGNOSIS — R251 Tremor, unspecified: Secondary | ICD-10-CM

## 2020-03-30 DIAGNOSIS — R202 Paresthesia of skin: Secondary | ICD-10-CM

## 2020-03-30 NOTE — Progress Notes (Addendum)
Subjective:  Patient ID: Kevin Villa, male    DOB: August 08, 1955  Age: 64 y.o. MRN: [id number]  CC:  Chief Complaint  Patient presents with  . Leg Pain    Right leg pain getting worse, leg gives out  . Wrist Pain    both wrists feel tight like something is squeezing them  . Neck Pain    feels a pain in back of neck and having dizzy spells and feels like he will pass out      HPI  This patient arrives today for the above.  He tells me over the last 1 to 2 months he has been experiencing headache, neck pain, bilateral wrist pain, bilateral upper extremity weakness, right leg pain and right leg weakness as well as dizzy spells with near syncope.  He tells me the symptoms have been getting progressively worse over the last 2 months.  He denies ever having actually lost consciousness.  He tells me he has had some urinary frequency and one episode of urinary incontinence, but has not had any additional urinary incontinence since then.  He does me he has fallen due to the symptoms over the last 2 months.  He tells me there is no obvious eliciting factors, and most of the symptoms will come and go and spontaneously resolved.  He has tried resting when the symptoms occur and that seems to help the symptoms.  He denies any new blurry vision.  He does have a tremor, he tells me that his tremor has gotten worse recently.  He also describes that sometimes during a dizzy episode he feels like he loses control of his upper and lower extremities and that they seem to be moving on their own.  He denies a history of seizures.  He denies history of stroke.  Past Medical History:  Diagnosis Date  . Arthritis    osteoarthritis   . Asthma    childhood   . GERD (gastroesophageal reflux disease)    occ  . Hematuria   . Hyperlipidemia   . Hypertension   . Pneumonia    years ago   . Pre-diabetes       Family History  Problem Relation Age of Onset  . Diabetes Mother   . Hypertension Mother     . Hypertension Father   . Cancer Brother        Unsure of type  . Hypertension Sister   . Diabetes Sister   . Hypertension Sister   . Hypertension Sister   . Hypertension Sister     Social History   Social History Narrative   Live in [place], [place]. On disability for hip pain.   Divorced, not dating.   No children.   Has friends and family.   Lives with mother or lives in his camper.   No pets.   Drinks about a 12 pack a weekend. Does not drink on a daily basis.    Does not drive b/c they expired and did not pay to get them renewed.    4 brothers and 4 sisters.       Social History   Tobacco Use  . Smoking status: Former Smoker    Packs/day: 0.25    Years: 30.00    Pack years: 7.50    Types: Cigarettes    Quit date: 2004    Years since quitting: 17.8  . Smokeless tobacco: Never Used  Substance Use Topics  . Alcohol use: Yes  Alcohol/week: 2.0 standard drinks    Types: 2 Cans of beer per week    Comment: 2 cans a day      Current Meds  Medication Sig  . amantadine (SYMMETREL) 100 MG capsule   . amLODipine (NORVASC) 5 MG tablet Take 1 tablet (5 mg total) by mouth daily.  . pravastatin (PRAVACHOL) 20 MG tablet Take 1 tablet (20 mg total) by mouth every evening.  . senna-docusate (SENOKOT-S) 8.6-50 MG tablet Take 1 tablet by mouth 2 (two) times daily.  . tamsulosin (FLOMAX) 0.4 MG CAPS capsule Take 1 capsule (0.4 mg total) by mouth daily.    ROS:  See HPI   Objective:   Today's Vitals: BP (!) 146/82   Pulse 85   Temp (!) 97.1 F (36.2 C) (Temporal)   Ht 5' 10"  (1.778 m)   Wt 241 lb (109.3 kg)   SpO2 99%   BMI 34.58 kg/m  Vitals with BMI 03/30/2020 03/11/2020 07/02/2019  Height 5' 10"  5' 10"  5' 11"   Weight 241 lbs 236 lbs 6 oz 213 lbs  BMI 34.58 25.85 27.78  Systolic 242 353 614  Diastolic 82 76 83  Pulse 85 85 80     Physical Exam Vitals reviewed.  Constitutional:      Appearance: Normal appearance.  HENT:     Head: Normocephalic and atraumatic.   Cardiovascular:     Rate and Rhythm: Normal rate and regular rhythm.  Pulmonary:     Effort: Pulmonary effort is normal.     Breath sounds: Normal breath sounds.  Musculoskeletal:     Cervical back: Neck supple.  Skin:    General: Skin is warm and dry.  Neurological:     Mental Status: He is alert and oriented to person, place, and time.     Cranial Nerves: Cranial nerves are intact.     Sensory: Sensation is intact.     Motor: Weakness (bilateral upper extremities and right lower extremity), tremor (bilateral hands, L>R) and pronator drift (mild to left upper extremity) present.     Coordination: Coordination normal.     Gait: Gait abnormal (limps to right side).     Deep Tendon Reflexes:     Reflex Scores:      Brachioradialis reflexes are 2+ on the right side and 2+ on the left side.      Patellar reflexes are 2+ on the right side and 2+ on the left side. Psychiatric:        Mood and Affect: Mood normal.        Behavior: Behavior normal.        Thought Content: Thought content normal.        Judgment: Judgment normal.          Assessment and Plan   1. Bilateral arm weakness   2. Right leg weakness   3. Acute nonintractable headache, unspecified headache type   4. Tremors of nervous system   5. Paresthesia of both hands      Plan: 1.-5.  Current etiology is unclear at this time.  I am concerned that he has some neurologic deficit occurring.  We will send referral to neurology as well as try to obtain MRI of brain, cervical spine, thoracic spine, and lumbar spine.  High my differential diagnosis list includes possible undiagnosed CVA, seizures, multiple sclerosis, space-occupying lesion of the brain.  I did discuss the above plan and case with my supervising physician Dr. Anastasio Champion and he is agreeable.  Tests ordered  Orders Placed This Encounter  Procedures  . MR Brain W Wo Contrast  . MR LUMBAR SPINE WO CONTRAST  . MR Thoracic Spine Wo Contrast  . MR Cervical  Spine Wo Contrast  . CMP with eGFR([facility])  . CBC with Differential/Platelets  . Ambulatory referral to Neurology      No orders of the defined types were placed in this encounter.   Patient to follow-up in 1 month for close monitoring.  Ailene Ards, NP

## 2020-03-31 ENCOUNTER — Other Ambulatory Visit (INDEPENDENT_AMBULATORY_CARE_PROVIDER_SITE_OTHER): Payer: Self-pay | Admitting: Nurse Practitioner

## 2020-03-31 DIAGNOSIS — R29898 Other symptoms and signs involving the musculoskeletal system: Secondary | ICD-10-CM

## 2020-03-31 LAB — COMPLETE METABOLIC PANEL WITH GFR
AG Ratio: 1.6 (calc) (ref 1.0–2.5)
ALT: 25 U/L (ref 9–46)
AST: 25 U/L (ref 10–35)
Albumin: 4.6 g/dL (ref 3.6–5.1)
Alkaline phosphatase (APISO): 68 U/L (ref 35–144)
BUN: 18 mg/dL (ref 7–25)
CO2: 23 mmol/L (ref 20–32)
Calcium: 10.1 mg/dL (ref 8.6–10.3)
Chloride: 100 mmol/L (ref 98–110)
Creat: 1 mg/dL (ref 0.70–1.25)
GFR, Est African American: 93 mL/min/{1.73_m2} (ref >=60)
GFR, Est Non African American: 80 mL/min/{1.73_m2} (ref >=60)
Globulin: 2.8 g/dL (calc) (ref 1.9–3.7)
Glucose, Bld: 109 mg/dL — ABNORMAL HIGH (ref 65–99)
Potassium: 4.3 mmol/L (ref 3.5–5.3)
Sodium: 137 mmol/L (ref 135–146)
Total Bilirubin: 0.4 mg/dL (ref 0.2–1.2)
Total Protein: 7.4 g/dL (ref 6.1–8.1)

## 2020-03-31 LAB — CBC WITH DIFFERENTIAL/PLATELET
Absolute Monocytes: 562 cells/uL (ref 200–950)
Basophils Absolute: 38 cells/uL (ref 0–200)
Basophils Relative: 0.5 %
Eosinophils Absolute: 114 cells/uL (ref 15–500)
Eosinophils Relative: 1.5 %
HCT: 40.1 % (ref 38.5–50.0)
Hemoglobin: 13.1 g/dL — ABNORMAL LOW (ref 13.2–17.1)
Lymphs Abs: 3162 cells/uL (ref 850–3900)
MCH: 26 pg — ABNORMAL LOW (ref 27.0–33.0)
MCHC: 32.7 g/dL (ref 32.0–36.0)
MCV: 79.7 fL — ABNORMAL LOW (ref 80.0–100.0)
MPV: 11.9 fL (ref 7.5–12.5)
Monocytes Relative: 7.4 %
Neutro Abs: 3724 cells/uL (ref 1500–7800)
Neutrophils Relative %: 49 %
Platelets: 178 10*3/uL (ref 140–400)
RBC: 5.03 10*6/uL (ref 4.20–5.80)
RDW: 14.2 % (ref 11.0–15.0)
Total Lymphocyte: 41.6 %
WBC: 7.6 10*3/uL (ref 3.8–10.8)

## 2020-04-01 DIAGNOSIS — M1611 Unilateral primary osteoarthritis, right hip: Secondary | ICD-10-CM | POA: Diagnosis not present

## 2020-04-01 DIAGNOSIS — M79604 Pain in right leg: Secondary | ICD-10-CM | POA: Diagnosis not present

## 2020-04-26 ENCOUNTER — Telehealth (INDEPENDENT_AMBULATORY_CARE_PROVIDER_SITE_OTHER): Payer: Self-pay

## 2020-04-26 NOTE — Telephone Encounter (Signed)
Patient called this morning and was very confused about our location. Patient has been to [place] several times and he thought we were located near the ER or we were upstairs but could not specify where upstairs. Patient knew he had an appointment but was not sure of our location.   I called his sister and left a message for her to check on him due to his confusion this morning.

## 2020-04-27 ENCOUNTER — Ambulatory Visit (INDEPENDENT_AMBULATORY_CARE_PROVIDER_SITE_OTHER): Payer: Medicare Other | Admitting: Internal Medicine

## 2020-04-27 ENCOUNTER — Encounter (INDEPENDENT_AMBULATORY_CARE_PROVIDER_SITE_OTHER): Payer: Self-pay | Admitting: Internal Medicine

## 2020-04-27 ENCOUNTER — Other Ambulatory Visit: Payer: Self-pay

## 2020-04-27 VITALS — BP 136/77 | HR 83 | Temp 97.3°F | Resp 18 | Ht 70.0 in | Wt 239.0 lb

## 2020-04-27 DIAGNOSIS — K5900 Constipation, unspecified: Secondary | ICD-10-CM | POA: Diagnosis not present

## 2020-04-27 DIAGNOSIS — D649 Anemia, unspecified: Secondary | ICD-10-CM

## 2020-04-27 NOTE — Progress Notes (Signed)
Metrics: Intervention Frequency ACO  Documented Smoking Status Yearly  Screened one or more times in 24 months  Cessation Counseling or  Active cessation medication Past 24 months  Past 24 months   Guideline developer: UpToDate (See UpToDate for funding source) Date Released: 2014       Wellness Office Visit  Subjective:  Patient ID: Kevin Villa, male    DOB: December 28, 1955  Age: 64 y.o. MRN: [id number]  CC: This man comes in because of constipation. HPI  He says for the last 2 weeks he has been more constipated.  He does open his bowels but stools are hard and it is infrequent.  He has apparently tried a stool softener without any success.  He says he drinks about 30 to 40 ounces of water daily. I also note that he is mildly anemic.  He has not had a colonoscopy. Past Medical History:  Diagnosis Date  . Arthritis    osteoarthritis   . Asthma    childhood   . GERD (gastroesophageal reflux disease)    occ  . Hematuria   . Hyperlipidemia   . Hypertension   . Pneumonia    years ago   . Pre-diabetes    Past Surgical History:  Procedure Laterality Date  . HERNIA REPAIR    . TOTAL HIP ARTHROPLASTY Right 11/06/2017   Procedure: RIGHT TOTAL HIP ARTHROPLASTY ANTERIOR APPROACH;  Surgeon: Durene Romans, MD;  Location: [facility];  Service: Orthopedics;  Laterality: Right;  70 mins     Family History  Problem Relation Age of Onset  . Diabetes Mother   . Hypertension Mother   . Hypertension Father   . Cancer Brother        Unsure of type  . Hypertension Sister   . Diabetes Sister   . Hypertension Sister   . Hypertension Sister   . Hypertension Sister     Social History   Social History Narrative   Live in [place], [place]. On disability for hip pain.   Divorced, not dating.   No children.   Has friends and family.   Lives with mother or lives in his camper.   No pets.   Drinks about a 12 pack a weekend. Does not drink on a daily basis.    Does not drive b/c they expired and  did not pay to get them renewed.    4 brothers and 4 sisters.       Social History   Tobacco Use  . Smoking status: Former Smoker    Packs/day: 0.25    Years: 30.00    Pack years: 7.50    Types: Cigarettes    Quit date: 2004    Years since quitting: 17.9  . Smokeless tobacco: Never Used  Substance Use Topics  . Alcohol use: Yes    Alcohol/week: 2.0 standard drinks    Types: 2 Cans of beer per week    Comment: 2 cans a day     Current Meds  Medication Sig  . amantadine (SYMMETREL) 100 MG capsule   . amLODipine (NORVASC) 5 MG tablet Take 1 tablet (5 mg total) by mouth daily.  Marland Kitchen doxycycline (VIBRAMYCIN) 100 MG capsule Take 1 capsule (100 mg total) by mouth 2 (two) times daily.  . pravastatin (PRAVACHOL) 20 MG tablet Take 1 tablet (20 mg total) by mouth every evening.  . senna-docusate (SENOKOT-S) 8.6-50 MG tablet Take 1 tablet by mouth 2 (two) times daily.  . tamsulosin (FLOMAX) 0.4 MG CAPS capsule  Take 1 capsule (0.4 mg total) by mouth daily.      Depression screen [facility] 2/9 04/27/2020 10/10/2017 07/18/2017  Decreased Interest 3 3 0  Down, Depressed, Hopeless 1 0 0  PHQ - 2 Score 4 3 0  Altered sleeping 3 3 -  Tired, decreased energy 2 3 -  Change in appetite 1 0 -  Feeling bad or failure about yourself  0 0 -  Trouble concentrating 2 1 -  Moving slowly or fidgety/restless 1 0 -  Suicidal thoughts 0 0 -  PHQ-9 Score 13 10 -  Difficult doing work/chores - Very difficult -     Objective:   Today's Vitals: BP 136/77 (BP Location: Right Arm, Patient Position: Sitting, Cuff Size: Normal)   Pulse 83   Temp (!) 97.3 F (36.3 C) (Temporal)   Resp 18   Ht 5\' 10"  (1.778 m)   Wt 239 lb (108.4 kg)   SpO2 98%   BMI 34.29 kg/m  Vitals with BMI 04/27/2020 03/30/2020 03/11/2020  Height 5\' 10"  5\' 10"  5\' 10"   Weight 239 lbs 241 lbs 236 lbs 6 oz  BMI 34.29 34.58 33.92  Systolic 136 146 03/13/2020  Diastolic 77 82 76  Pulse 83 85 85     Physical Exam   He looks systemically  well.  Weight is stable.  Blood pressure is acceptable.    Assessment   1. Constipation, unspecified constipation type   2. Anemia, unspecified type       Tests ordered Orders Placed This Encounter  Procedures  . Ambulatory referral to Gastroenterology     Plan: 1. I recommended that he drink more water, approximately 120 ounces of water a day and more fiber in his diet including green leafy vegetables, sweet potatoes, beans.  He was should avoid cheese. 2. I will refer him to gastroenterology for further evaluation. 3. Follow-up as scheduled with in January.   No orders of the defined types were placed in this encounter.   , MD

## 2020-04-28 ENCOUNTER — Other Ambulatory Visit (INDEPENDENT_AMBULATORY_CARE_PROVIDER_SITE_OTHER): Payer: Self-pay | Admitting: Nurse Practitioner

## 2020-04-28 ENCOUNTER — Ambulatory Visit (HOSPITAL_COMMUNITY)
Admission: RE | Admit: 2020-04-28 | Discharge: 2020-04-28 | Disposition: A | Discharge status: Home / Self Care | Payer: Medicare Other | Source: Ambulatory Visit | Attending: Nurse Practitioner | Admitting: Nurse Practitioner

## 2020-04-28 DIAGNOSIS — R202 Paresthesia of skin: Secondary | ICD-10-CM

## 2020-04-28 DIAGNOSIS — R251 Tremor, unspecified: Secondary | ICD-10-CM

## 2020-04-28 DIAGNOSIS — M4802 Spinal stenosis, cervical region: Secondary | ICD-10-CM | POA: Diagnosis not present

## 2020-04-28 DIAGNOSIS — R29898 Other symptoms and signs involving the musculoskeletal system: Secondary | ICD-10-CM | POA: Diagnosis not present

## 2020-04-28 DIAGNOSIS — R519 Headache, unspecified: Secondary | ICD-10-CM | POA: Diagnosis not present

## 2020-04-28 DIAGNOSIS — M4800 Spinal stenosis, site unspecified: Secondary | ICD-10-CM

## 2020-04-28 DIAGNOSIS — M545 Low back pain, unspecified: Secondary | ICD-10-CM | POA: Diagnosis not present

## 2020-04-28 DIAGNOSIS — M546 Pain in thoracic spine: Secondary | ICD-10-CM | POA: Diagnosis not present

## 2020-04-28 IMAGING — MR MR HEAD WO/W CM
13 of 14 series · 38 of 48 positions shown · IV contrast (gadavist)
Comparison: Head CT [DATE].

CLINICAL DATA: Headache, new or worsening.

EXAM:
MRI HEAD WITHOUT AND WITH CONTRAST
TECHNIQUE: Multiplanar, multiecho pulse sequences of the brain and surrounding
structures were obtained without and with intravenous contrast.
CONTRAST:  8mL GADAVIST GADOBUTROL 1 MMOL/ML IV SOLN

[Series 5: DWI · axial · 3.0mm · 0.77mm/px · z∈[-170,-16]mm · 3 of 55 slices shown (1 of 4)]
[im 1/55]
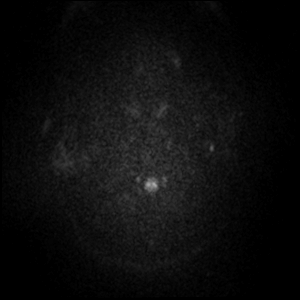
[im 28/55]
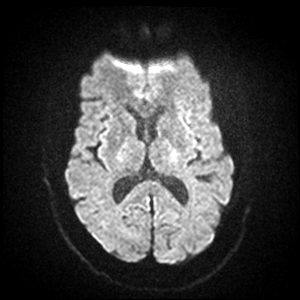
[im 55/55]
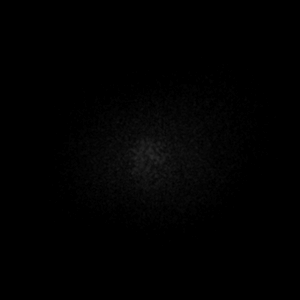

[Series 6: DWI · axial · 3.0mm · 0.77mm/px · z∈[-170,-16]mm · 3 of 55 slices shown (2 of 4)]
[im 1/55]
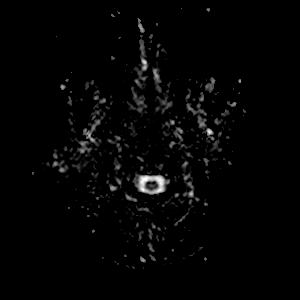
[im 28/55]
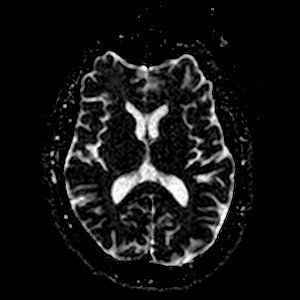
[im 55/55]
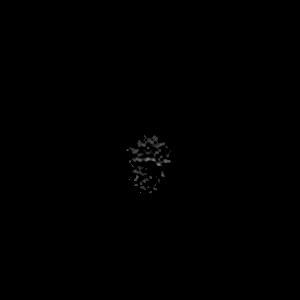

[Series 7: DWI · coronal · 5.0mm · 0.88mm/px · 2 of 34 slices shown (3 of 4)]
[im 1/34]
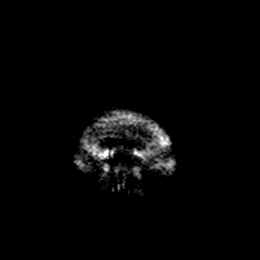
[im 34/34]
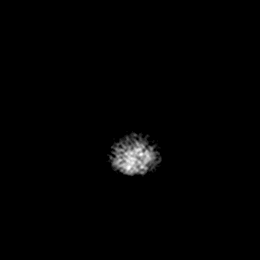

[Series 8: DWI · coronal · 5.0mm · 0.88mm/px · 2 of 34 slices shown (4 of 4)]
[im 1/34]
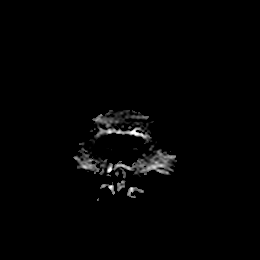
[im 34/34]
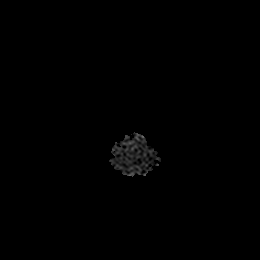

[Series 9: T1 · sagittal · 5.0mm · 0.75mm/px · 1 of 23 slices shown (1 of 2)]
[im 1/23]
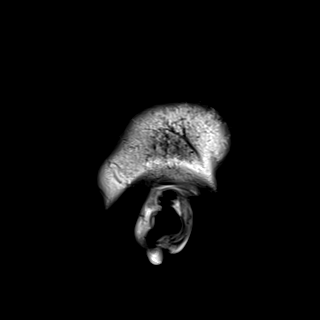

[Series 10: T2 · axial · 5.0mm · 0.72mm/px · 1 of 24 slices shown]
[im 1/24]
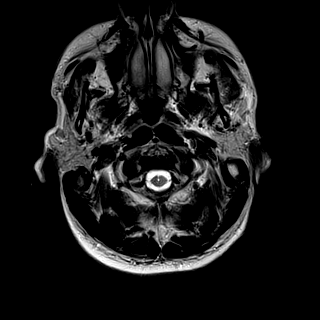

[Series 11: mag_images · axial · 3.0mm · 0.90mm/px · z∈[-172,-15]mm · 3 of 56 slices shown]
[im 1/56]
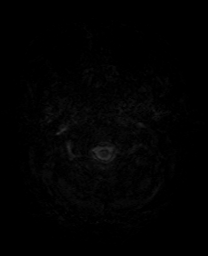
[im 28/56]
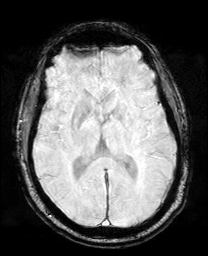
[im 56/56]
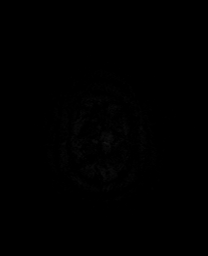

[Series 12: pha_images · axial · 3.0mm · 0.90mm/px · z∈[-169,-15]mm · 3 of 55 slices shown]
[im 1/55]
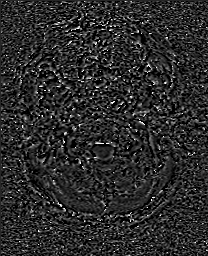
[im 28/55]
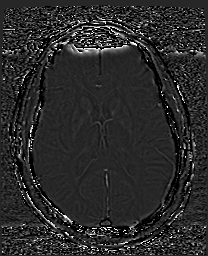
[im 55/55]
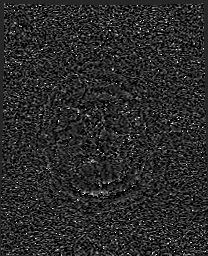

[Series 13: swi_images · axial · 3.0mm · 0.90mm/px · z∈[-172,-15]mm · 3 of 56 slices shown]
[im 1/56]
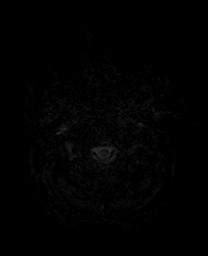
[im 28/56]
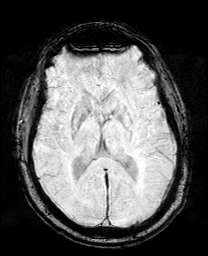
[im 56/56]
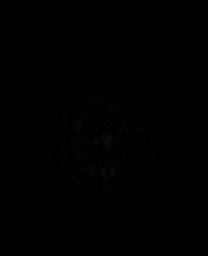

[Series 15: FLAIR · axial · 3.0mm · 0.45mm/px · z∈[-168,-16]mm · 3 of 54 slices shown]
[im 1/54]
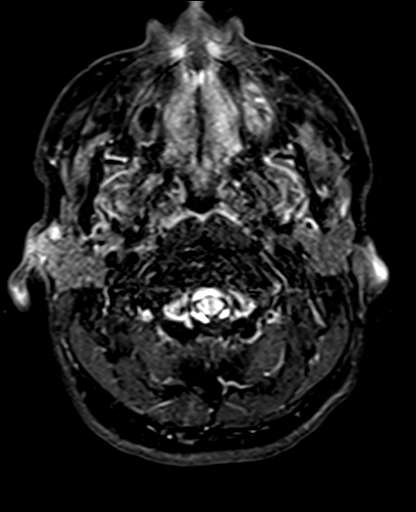
[im 27/54]
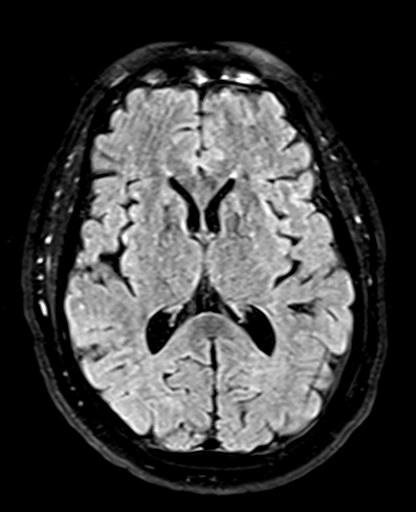
[im 54/54]
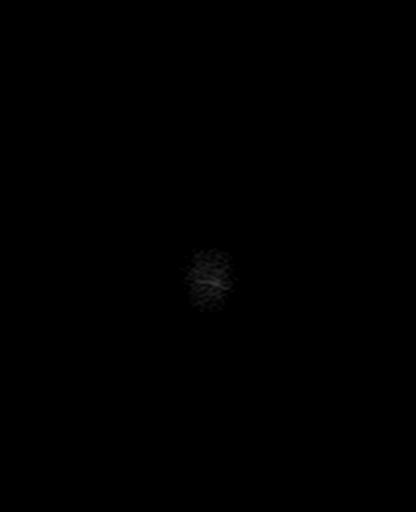

[Series 16: T1 · axial · 1.0mm · 0.98mm/px · z∈[-173,-7]mm · 10 of 174 slices shown (2 of 2)]
[im 1/174]
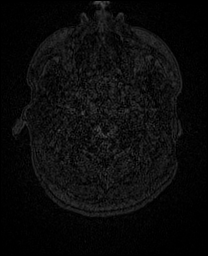
[im 20/174]
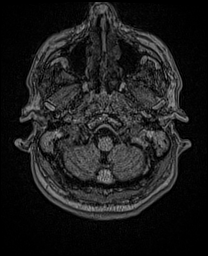
[im 39/174]
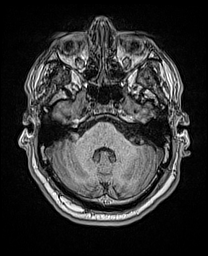
[im 58/174]
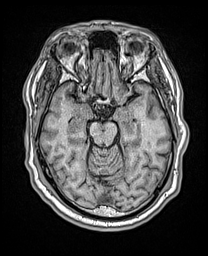
[im 77/174]
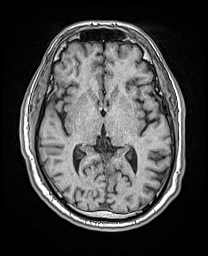
[im 97/174]
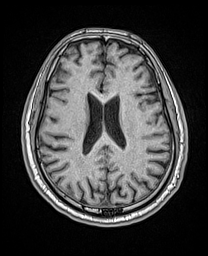
[im 116/174]
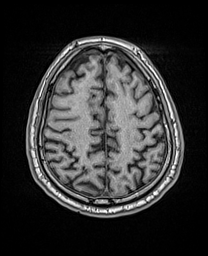
[im 135/174]
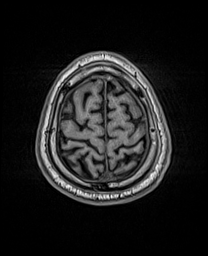
[im 154/174]
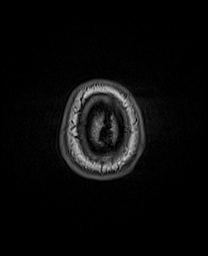
[im 174/174]
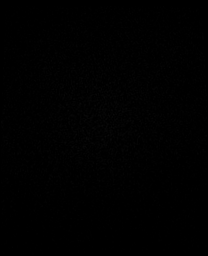

[Series 17: T2 post-contrast · coronal · 5.0mm · 0.72mm/px · 2 of 31 slices shown]
[im 1/31]
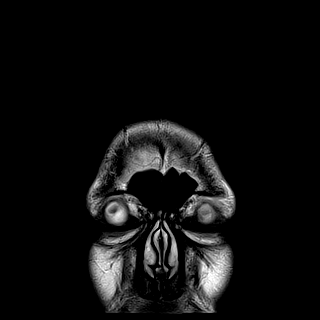
[im 31/31]
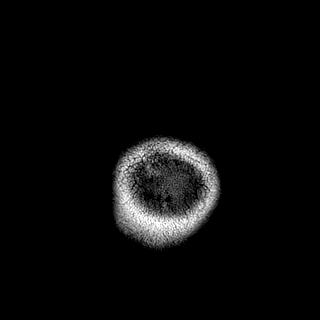

[Series 19: T1 post-contrast · coronal · 5.0mm · 0.34mm/px · 2 of 28 slices shown]
[im 1/28]
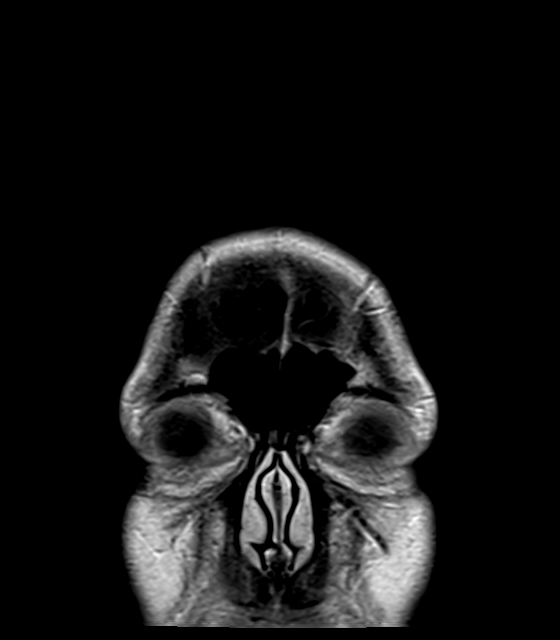
[im 28/28]
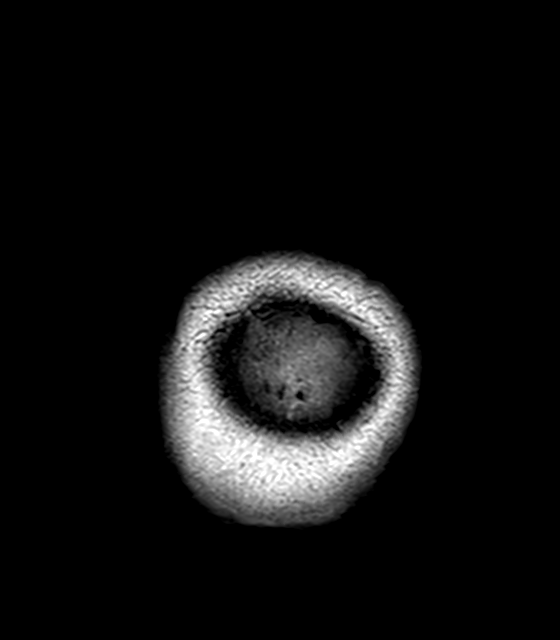

[38 of 48 positions shown; findings below may reference images not displayed]

FINDINGS: Brain: No acute infarction, hemorrhage, hydrocephalus, extra-axial
collection or mass lesion. Small remote infarcts in the right
frontal and parietal lobes. No significant white matter disease. No
focus of abnormal contrast enhancement.

Vascular: Normal flow voids.

Skull and upper cervical spine: Normal marrow signal.

Sinuses/Orbits: Dehiscence of the left lamina papyracea appear
unchanged. Paranasal sinuses are essentially clear.

Other: None.
IMPRESSION: 1. No acute intracranial abnormality.
2. Small remote infarcts in the right frontal and parietal lobes.
3. Otherwise unremarkable MRI of the brain.

## 2020-04-28 IMAGING — MR MR THORACIC SPINE W/O CM
4 of 6 series · 17 of 48 positions shown · non-contrast
Comparison: None.

CLINICAL DATA: Cervical radiculopathy. Bilateral arm weakness.
Tremors of nervous system. Paresthesia of both hands.

Mid back pain, neuro deficit.
Low back pain, progressive neurological deficit.
EXAM:
MRI CERVICAL, THORACIC AND LUMBAR SPINE WITHOUT CONTRAST
TECHNIQUE: Multiplanar and multiecho pulse sequences of the cervical spine, to
include the craniocervical junction and cervicothoracic junction,
and thoracic and lumbar spine, were obtained without intravenous
contrast.

[Series 18: T1 · sagittal · 3.3mm · 0.62mm/px · 3 of 14 slices shown (1 of 2)]
[im 1/14]
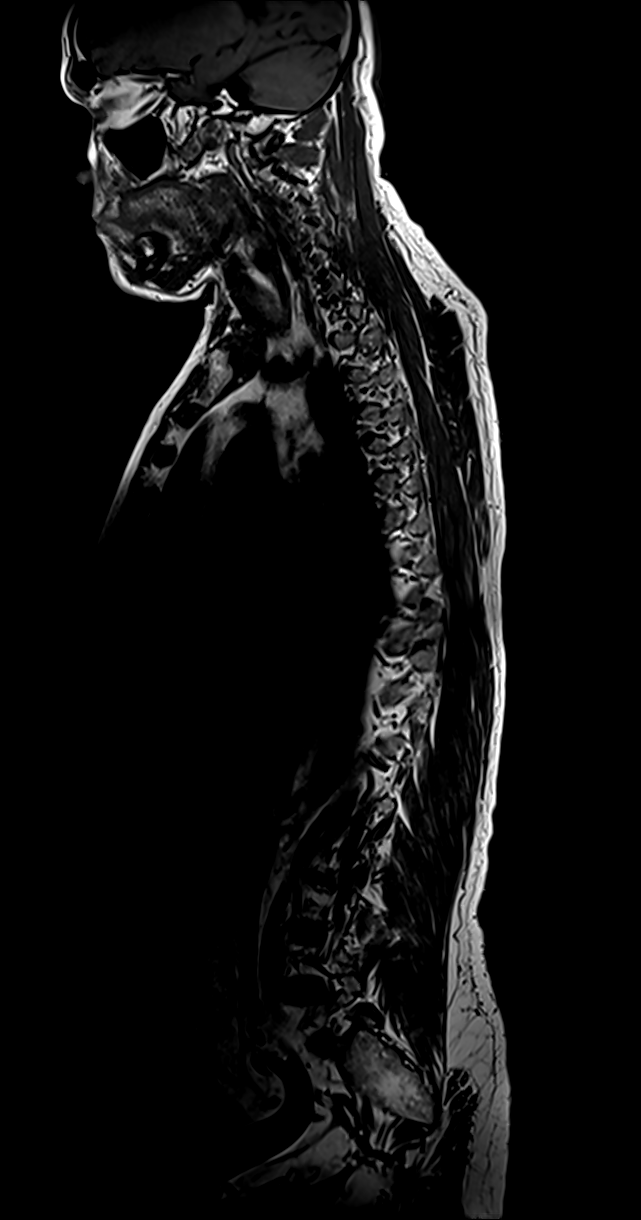
[im 9/14]
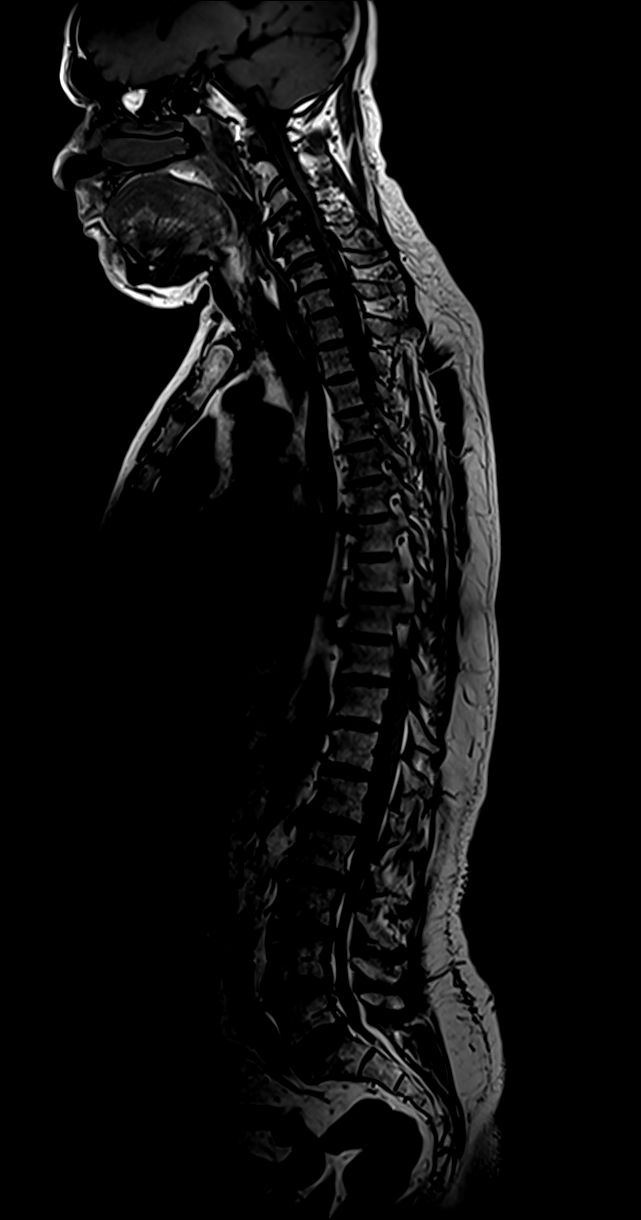
[im 14/14]
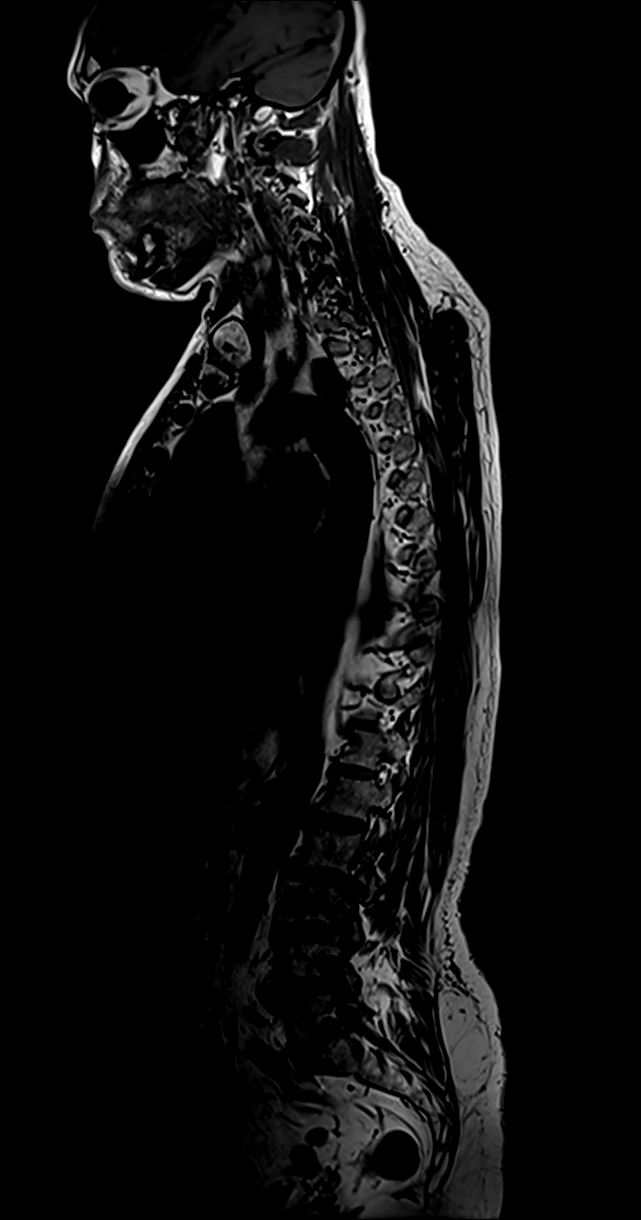

[Series 19: T2 · sagittal · 3.0mm · 0.76mm/px · 6 of 17 slices shown (1 of 2)]
[im 1/17]
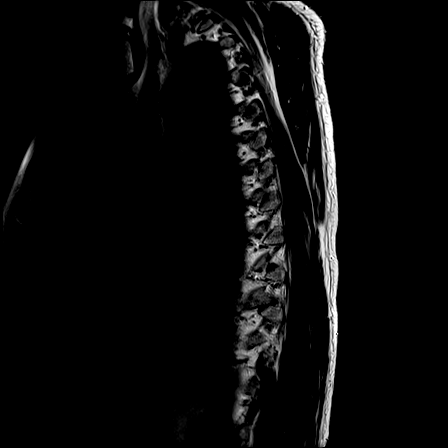
[im 4/17]
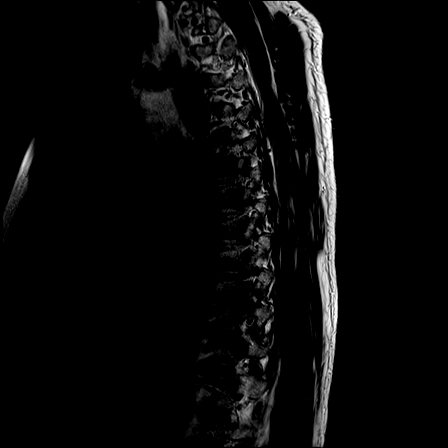
[im 7/17]
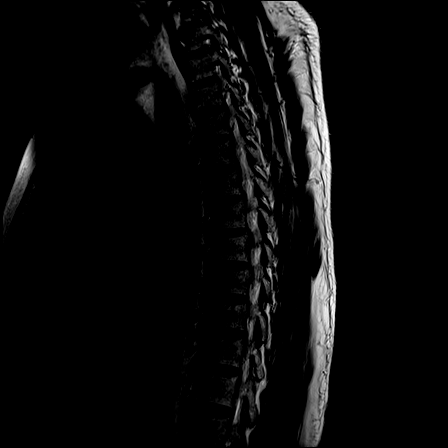
[im 10/17]
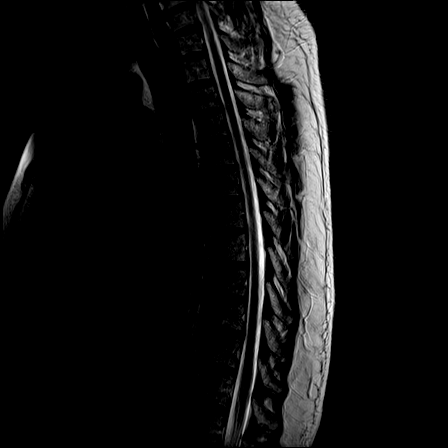
[im 13/17]
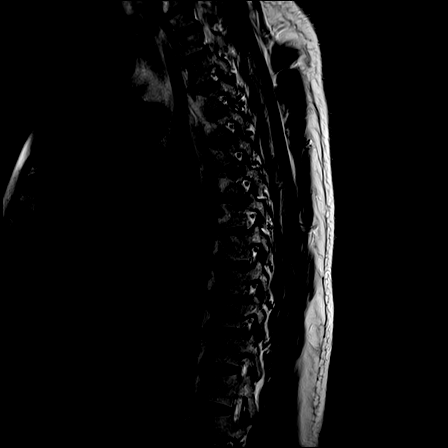
[im 17/17]
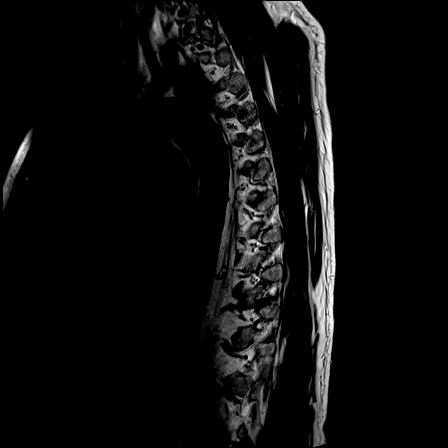

[Series 20: T1 · sagittal · 3.0mm · 0.79mm/px · 3 of 17 slices shown (2 of 2)]
[im 4/17]
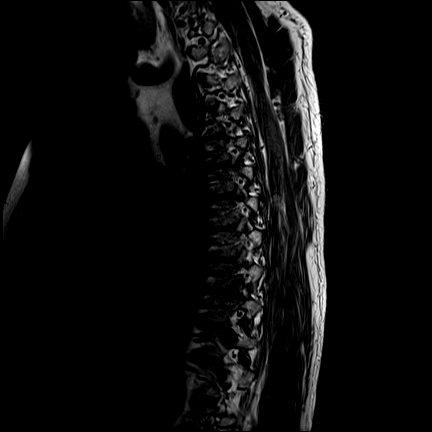
[im 10/17]
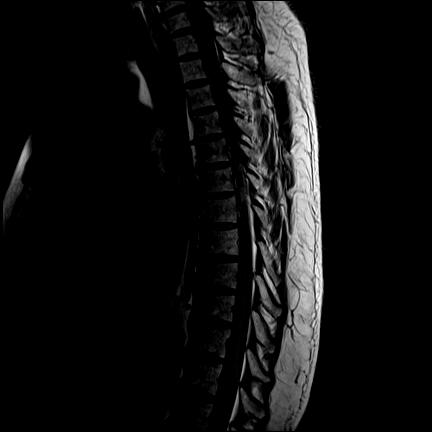
[im 17/17]
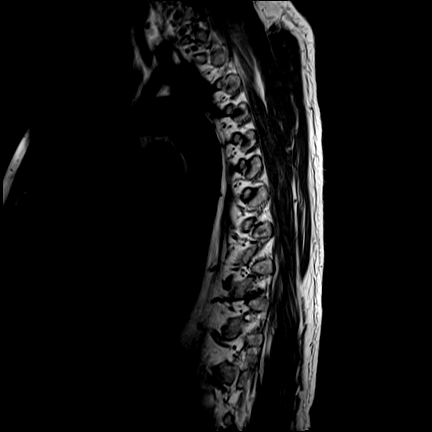

[Series 22: T2 · axial · 5.0mm · 0.59mm/px · z∈[-282,-63]mm · 5 of 36 slices shown (2 of 2)]
[im 1/36]
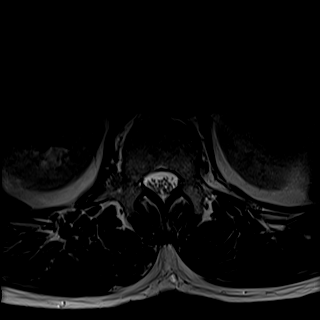
[im 6/36]
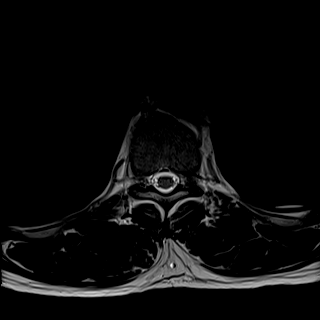
[im 12/36]
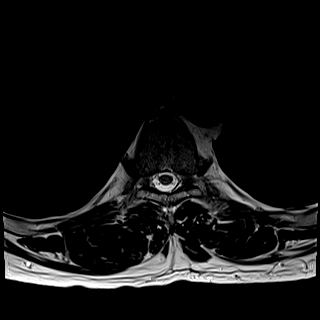
[im 18/36]
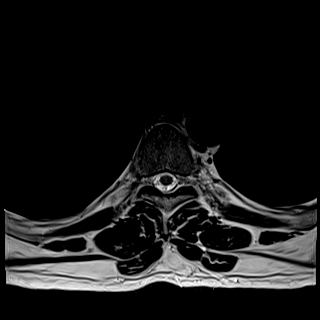
[im 30/36]
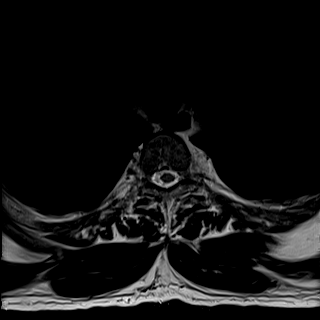

[17 of 48 positions shown; findings below may reference images not displayed]

FINDINGS: MRI CERVICAL SPINE FINDINGS

Alignment: Mild reversal of the cervical curvature.

Vertebrae: Mild chronic wedging of C3. No acute fracture, evidence
of discitis, or bone lesion.

Cord: Flattening of the spinal cord with increase T2 signal at C3-4
and C4-5 and reduce cord volume at this level.

Posterior Fossa, vertebral arteries, paraspinal tissues: Negative.

Disc levels:

C2-3: Facet degenerative changes resulting in mild right neural
foraminal narrowing. No spinal canal stenosis.

C3-4: Posterior disc protrusion resulting in moderate to severe
spinal canal stenosis with flattening of the spinal cord and
increased T2 cord signal suggestive of myelomalacia. Uncovertebral
and facet degenerative changes resulting in severe right and
moderate left neural foraminal narrowing.

C4-5: Posterior disc protrusion resulting in moderate spinal canal
stenosis with flattening of the anterior cord and increased cord T2
signal suggestive of myelomalacia. Uncovertebral and facet
degenerative changes resulting in severe bilateral neural foraminal
narrowing.

C5-6: Posterior disc protrusion resulting in mild spinal canal
stenosis. Uncovertebral and facet degenerative changes resulting in
moderate right and mild left neural foraminal narrowing.

C6-7: Posterior disc protrusion resulting in mild spinal canal
stenosis. Uncovertebral and facet degenerative changes resulting in
mild right and moderate left neural foraminal narrowing.

C7-T1: Tiny posterior disc protrusion without significant spinal
canal stenosis. Uncovertebral and facet degenerative changes
resulting in moderate bilateral neural foraminal narrowing, right
greater than left.

MRI THORACIC SPINE FINDINGS

Alignment:  Physiologic.

Vertebrae: No fracture, evidence of discitis, or bone lesion.

Cord:  Normal signal and morphology.

Paraspinal and other soft tissues: Negative.

Disc levels: No significant disc herniation, spinal canal or neural
foraminal stenosis at any thoracic level.

MRI LUMBAR SPINE FINDINGS

Segmentation:  Standard.

Alignment:  Physiologic.

Vertebrae: No fracture, evidence of discitis, or bone lesion of the
lumbar spine. Partially evaluated T1 hypointense lesion within the
left iliac bone (series 22, image 8).

Conus medullaris and cauda equina: Conus extends to the T12 level.
Conus and cauda equina appear normal.

Paraspinal and other soft tissues: Negative.

Disc levels:

T12-L1: No spinal canal or neural foraminal stenosis.

L1-2: Shallow disc bulge. No spinal canal or neural foraminal
stenosis.

L2-3: Disc bulge, mild facet degenerative changes and ligamentum
flavum redundancy resulting in narrowing of the bilateral
subarticular zones and moderate bilateral neural foraminal
narrowing.

L3-4: Disc bulge, facet degenerative changes and ligamentum flavum
redundancy resulting in narrowing of the bilateral subarticular
zones and moderate bilateral neural foraminal narrowing.

L4-5: Disc bulge, moderate facet degenerative changes and ligamentum
flavum redundancy resulting in narrowing of the bilateral
subarticular zones and moderate bilateral neural foraminal
narrowing.

L5-S1: Disc bulge and facet degenerative changes resulting in severe
right and mild-to-moderate left neural foraminal narrowing. No
spinal canal stenosis.
IMPRESSION: 1. Multilevel degenerative changes of the cervical spine with
moderate to severe spinal canal stenosis at C3-4 and moderate at
C4-5 with flattening of the spinal cord and increased T2 cord signal
suggestive of myelomalacia.
2. Multilevel cervical neural foraminal stenosis, severe bilaterally
at C4-5.
3. No significant spinal canal or neural foraminal stenosis of the
thoracic spine.
4. Multilevel lumbar spondylosis with narrowing of the bilateral
subarticular zones and moderate bilateral neural foraminal narrowing
at L2-3, L3-4 and L4-5. Severe right neural foraminal narrowing at
L5-S1.
5. A T1 hypointense lesion partially visualized in the left iliac
bone. Correlation plain films suggested.

## 2020-04-28 IMAGING — MR MR CERVICAL SPINE W/O CM
5 series · 33 of 48 positions shown · non-contrast
Comparison: None.

CLINICAL DATA: Cervical radiculopathy. Bilateral arm weakness.
Tremors of nervous system. Paresthesia of both hands.

Mid back pain, neuro deficit.
Low back pain, progressive neurological deficit.
EXAM:
MRI CERVICAL, THORACIC AND LUMBAR SPINE WITHOUT CONTRAST
TECHNIQUE: Multiplanar and multiecho pulse sequences of the cervical spine, to
include the craniocervical junction and cervicothoracic junction,
and thoracic and lumbar spine, were obtained without intravenous
contrast.

[Series 16: T2 · sagittal · 3.0mm · 0.69mm/px · 6 of 15 slices shown (1 of 2)]
[im 1/15]
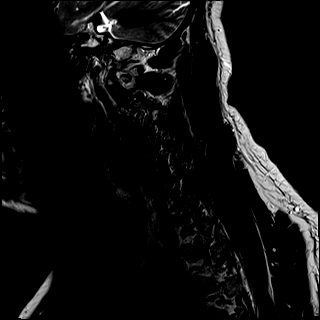
[im 3/15]
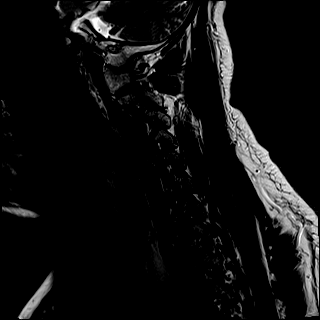
[im 6/15]
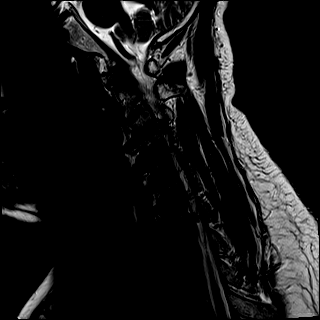
[im 9/15]
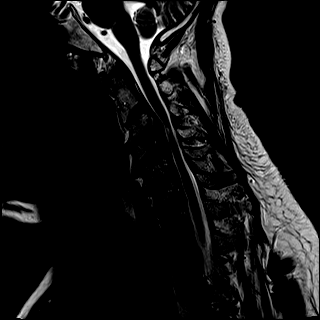
[im 12/15]
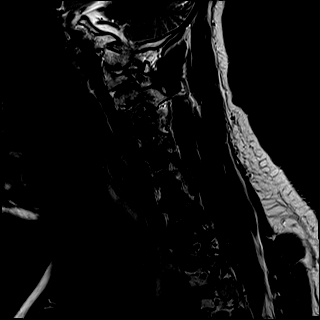
[im 15/15]
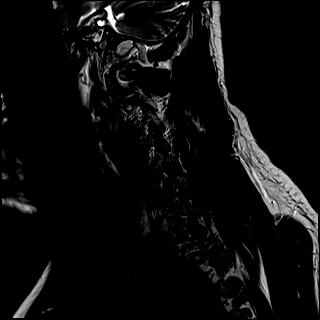

[Series 17: T1 · sagittal · 3.0mm · 0.69mm/px · 6 of 15 slices shown]
[im 1/15]
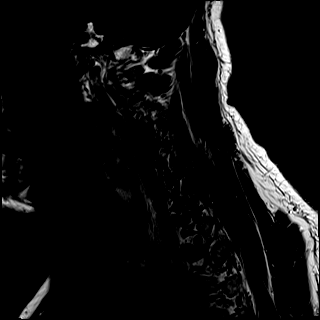
[im 3/15]
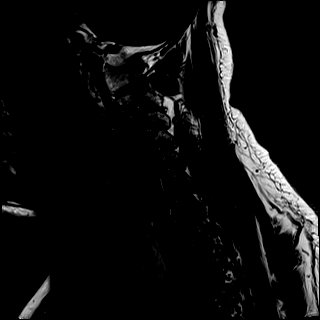
[im 6/15]
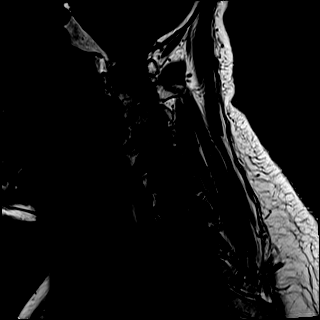
[im 9/15]
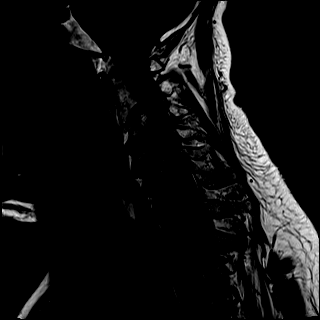
[im 12/15]
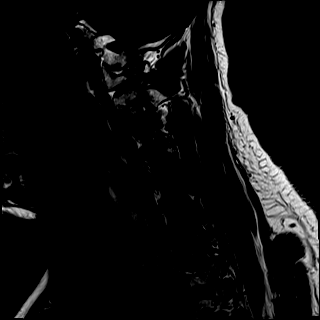
[im 15/15]
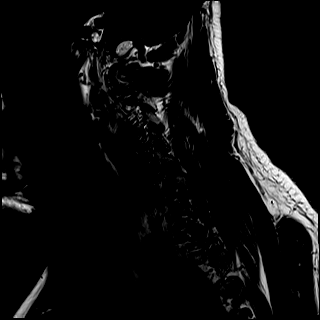

[Series 18: STIR · sagittal · 3.0mm · 0.86mm/px · 6 of 15 slices shown]
[im 1/15]
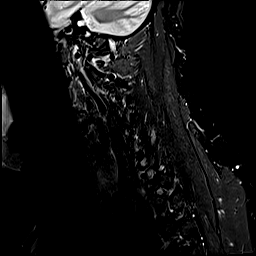
[im 3/15]
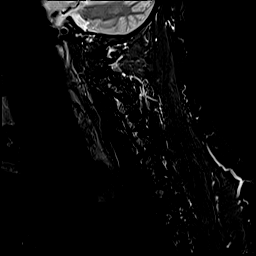
[im 6/15]
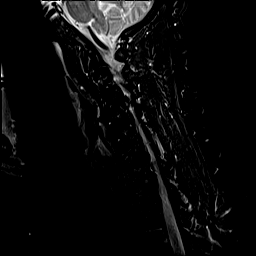
[im 9/15]
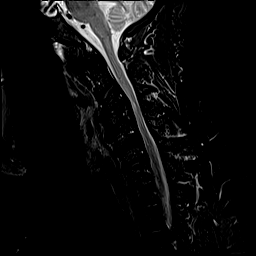
[im 12/15]
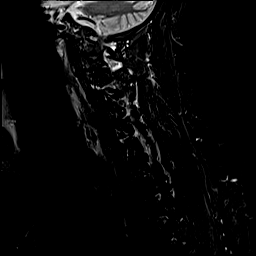
[im 15/15]
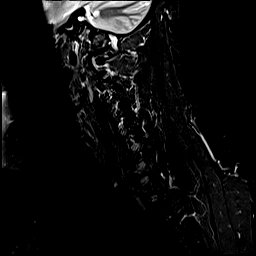

[Series 19: T2 · axial · 3.0mm · 0.66mm/px · z∈[-34,+73]mm · 9 of 35 slices shown (2 of 2)]
[im 1/35]
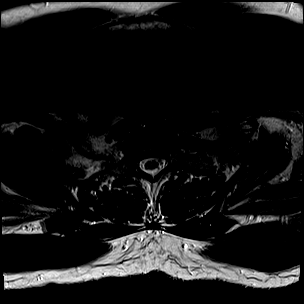
[im 5/35]
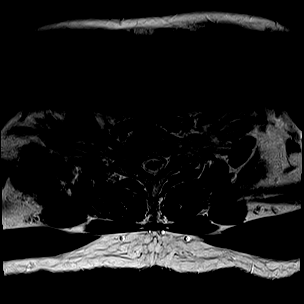
[im 10/35]
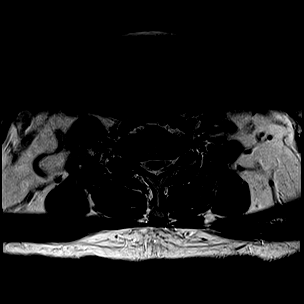
[im 15/35]
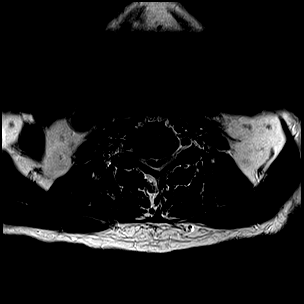
[im 18/35]
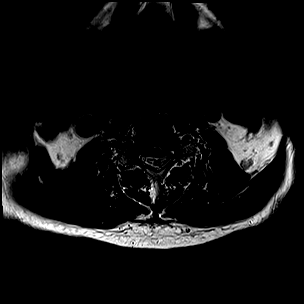
[im 20/35]
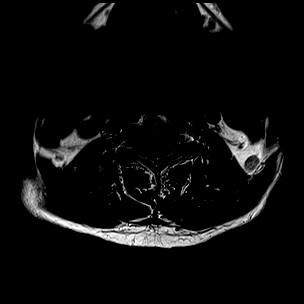
[im 25/35]
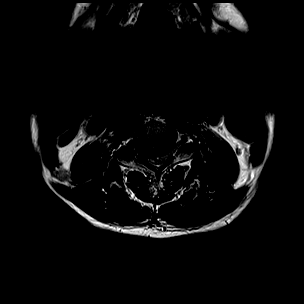
[im 30/35]
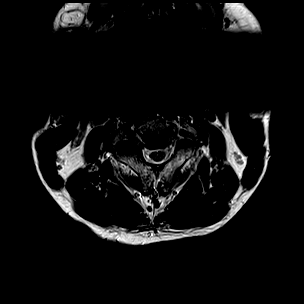
[im 35/35]
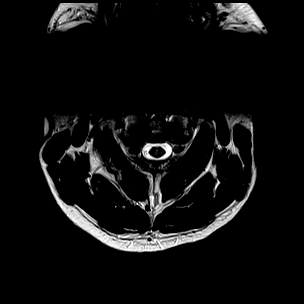

[Series 20: GRE · axial · 3.0mm · 0.35mm/px · z∈[-31,+45]mm · 6 of 35 slices shown]
[im 1/35]
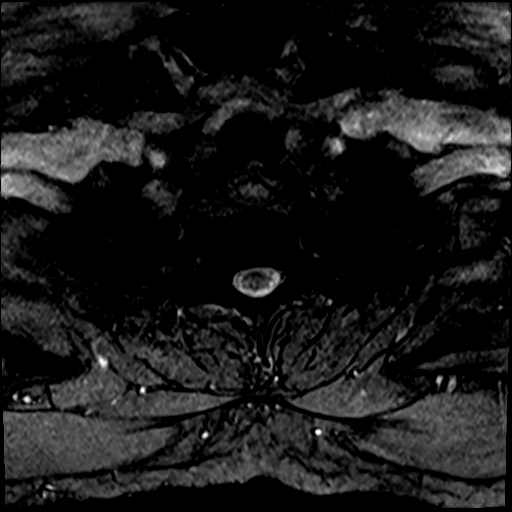
[im 5/35]
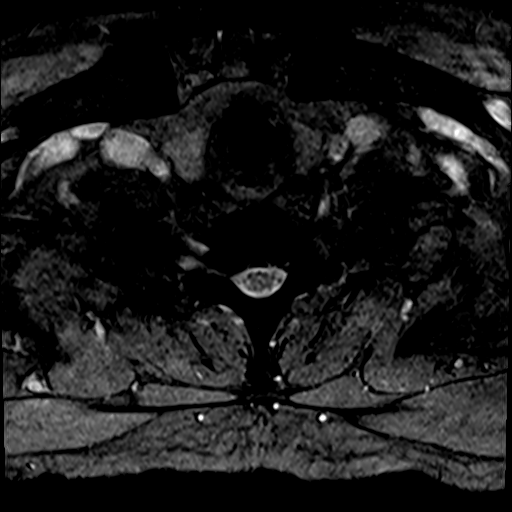
[im 10/35]
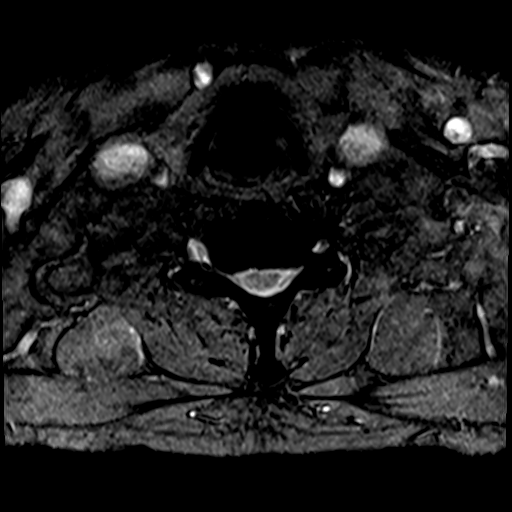
[im 15/35]
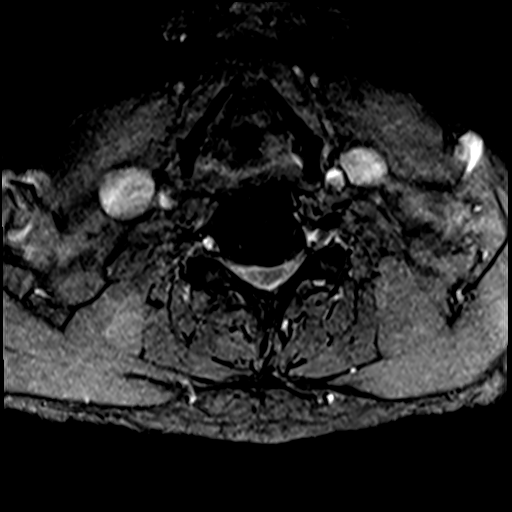
[im 20/35]
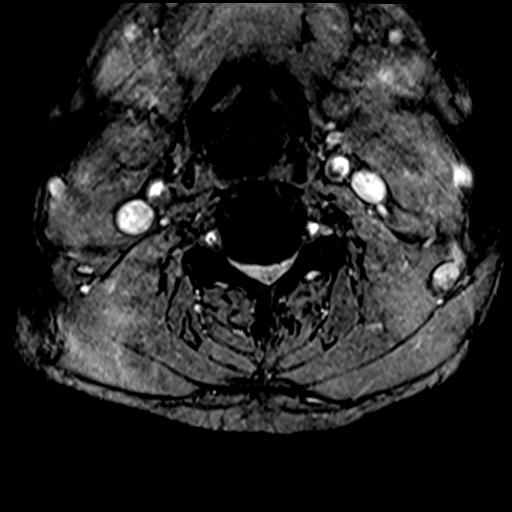
[im 25/35]
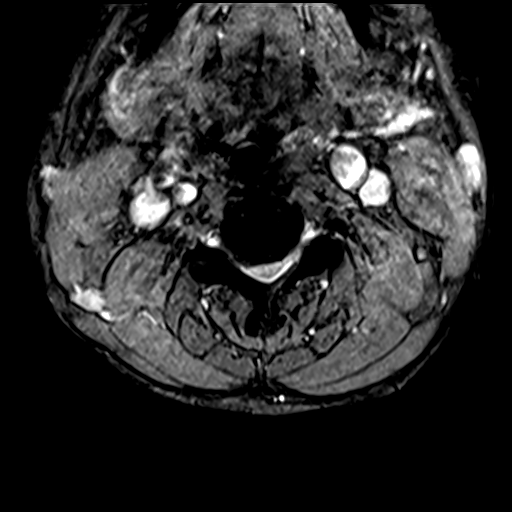

[33 of 48 positions shown; findings below may reference images not displayed]

FINDINGS: MRI CERVICAL SPINE FINDINGS

Alignment: Mild reversal of the cervical curvature.

Vertebrae: Mild chronic wedging of C3. No acute fracture, evidence
of discitis, or bone lesion.

Cord: Flattening of the spinal cord with increase T2 signal at C3-4
and C4-5 and reduce cord volume at this level.

Posterior Fossa, vertebral arteries, paraspinal tissues: Negative.

Disc levels:

C2-3: Facet degenerative changes resulting in mild right neural
foraminal narrowing. No spinal canal stenosis.

C3-4: Posterior disc protrusion resulting in moderate to severe
spinal canal stenosis with flattening of the spinal cord and
increased T2 cord signal suggestive of myelomalacia. Uncovertebral
and facet degenerative changes resulting in severe right and
moderate left neural foraminal narrowing.

C4-5: Posterior disc protrusion resulting in moderate spinal canal
stenosis with flattening of the anterior cord and increased cord T2
signal suggestive of myelomalacia. Uncovertebral and facet
degenerative changes resulting in severe bilateral neural foraminal
narrowing.

C5-6: Posterior disc protrusion resulting in mild spinal canal
stenosis. Uncovertebral and facet degenerative changes resulting in
moderate right and mild left neural foraminal narrowing.

C6-7: Posterior disc protrusion resulting in mild spinal canal
stenosis. Uncovertebral and facet degenerative changes resulting in
mild right and moderate left neural foraminal narrowing.

C7-T1: Tiny posterior disc protrusion without significant spinal
canal stenosis. Uncovertebral and facet degenerative changes
resulting in moderate bilateral neural foraminal narrowing, right
greater than left.

MRI THORACIC SPINE FINDINGS

Alignment:  Physiologic.

Vertebrae: No fracture, evidence of discitis, or bone lesion.

Cord:  Normal signal and morphology.

Paraspinal and other soft tissues: Negative.

Disc levels: No significant disc herniation, spinal canal or neural
foraminal stenosis at any thoracic level.

MRI LUMBAR SPINE FINDINGS

Segmentation:  Standard.

Alignment:  Physiologic.

Vertebrae: No fracture, evidence of discitis, or bone lesion of the
lumbar spine. Partially evaluated T1 hypointense lesion within the
left iliac bone (series 22, image 8).

Conus medullaris and cauda equina: Conus extends to the T12 level.
Conus and cauda equina appear normal.

Paraspinal and other soft tissues: Negative.

Disc levels:

T12-L1: No spinal canal or neural foraminal stenosis.

L1-2: Shallow disc bulge. No spinal canal or neural foraminal
stenosis.

L2-3: Disc bulge, mild facet degenerative changes and ligamentum
flavum redundancy resulting in narrowing of the bilateral
subarticular zones and moderate bilateral neural foraminal
narrowing.

L3-4: Disc bulge, facet degenerative changes and ligamentum flavum
redundancy resulting in narrowing of the bilateral subarticular
zones and moderate bilateral neural foraminal narrowing.

L4-5: Disc bulge, moderate facet degenerative changes and ligamentum
flavum redundancy resulting in narrowing of the bilateral
subarticular zones and moderate bilateral neural foraminal
narrowing.

L5-S1: Disc bulge and facet degenerative changes resulting in severe
right and mild-to-moderate left neural foraminal narrowing. No
spinal canal stenosis.
IMPRESSION: 1. Multilevel degenerative changes of the cervical spine with
moderate to severe spinal canal stenosis at C3-4 and moderate at
C4-5 with flattening of the spinal cord and increased T2 cord signal
suggestive of myelomalacia.
2. Multilevel cervical neural foraminal stenosis, severe bilaterally
at C4-5.
3. No significant spinal canal or neural foraminal stenosis of the
thoracic spine.
4. Multilevel lumbar spondylosis with narrowing of the bilateral
subarticular zones and moderate bilateral neural foraminal narrowing
at L2-3, L3-4 and L4-5. Severe right neural foraminal narrowing at
L5-S1.
5. A T1 hypointense lesion partially visualized in the left iliac
bone. Correlation plain films suggested.

## 2020-04-28 IMAGING — MR MR LUMBAR SPINE W/O CM
5 of 7 series · 29 of 48 positions shown · non-contrast
Comparison: None.

CLINICAL DATA: Cervical radiculopathy. Bilateral arm weakness.
Tremors of nervous system. Paresthesia of both hands.

Mid back pain, neuro deficit.
Low back pain, progressive neurological deficit.
EXAM:
MRI CERVICAL, THORACIC AND LUMBAR SPINE WITHOUT CONTRAST
TECHNIQUE: Multiplanar and multiecho pulse sequences of the cervical spine, to
include the craniocervical junction and cervicothoracic junction,
and thoracic and lumbar spine, were obtained without intravenous
contrast.

[Series 16: T2 · sagittal · 4.0mm · 0.73mm/px · 8 of 15 slices shown (1 of 3)]
[im 1/15]
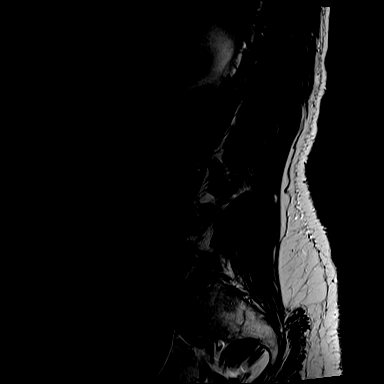
[im 3/15]
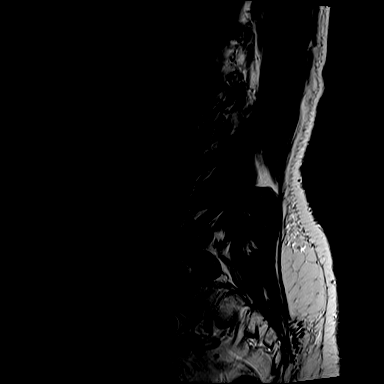
[im 5/15]
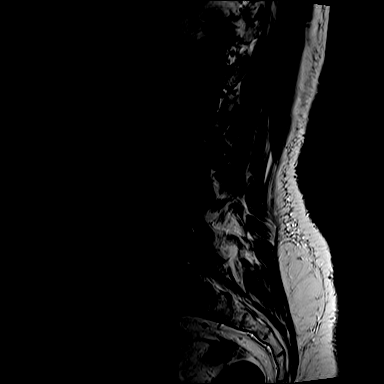
[im 7/15]
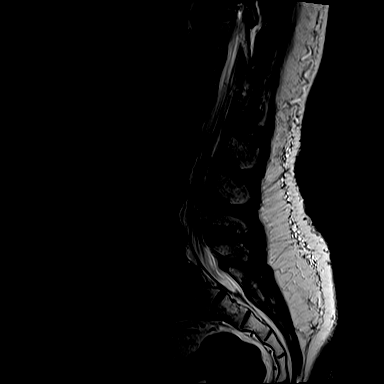
[im 9/15]
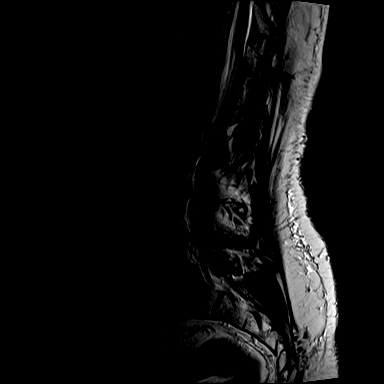
[im 11/15]
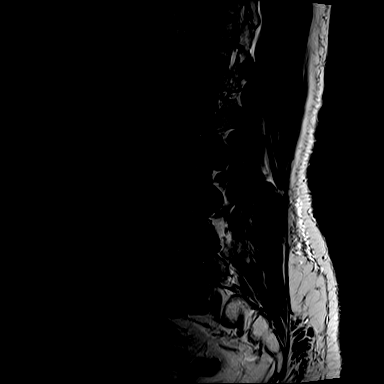
[im 13/15]
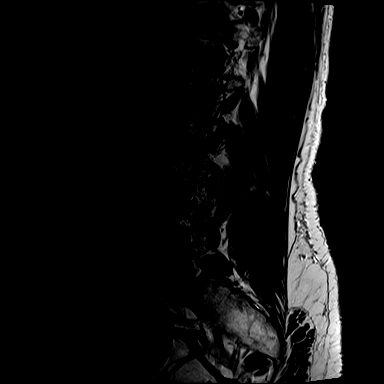
[im 15/15]
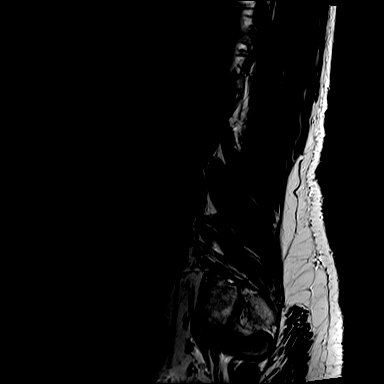

[Series 18: T1 · sagittal · 4.0mm · 0.88mm/px · 7 of 15 slices shown (1 of 2)]
[im 1/15]
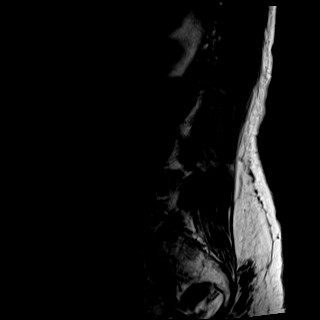
[im 3/15]
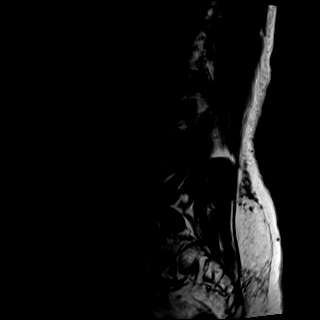
[im 5/15]
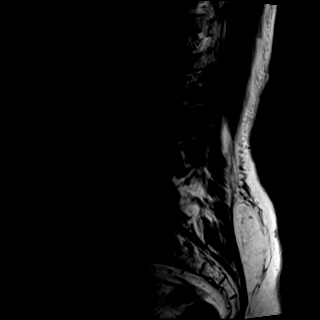
[im 8/15]
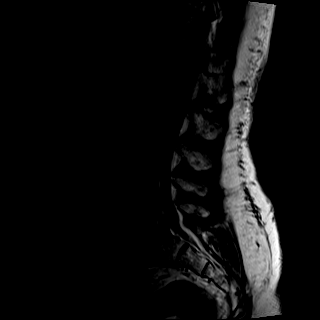
[im 10/15]
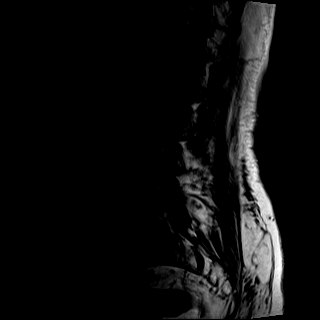
[im 12/15]
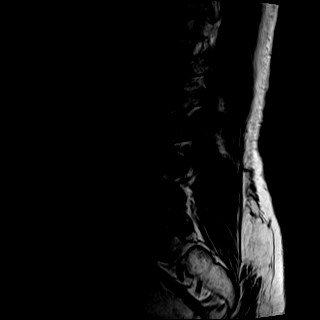
[im 15/15]
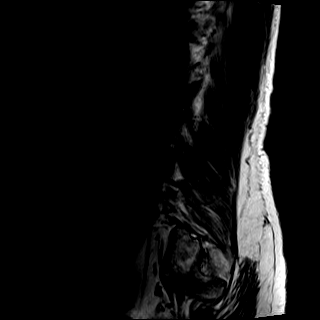

[Series 19: T2 · axial · 5.0mm · 0.57mm/px · z∈[-419,-285]mm · 9 of 19 slices shown (2 of 3)]
[im 1/19]
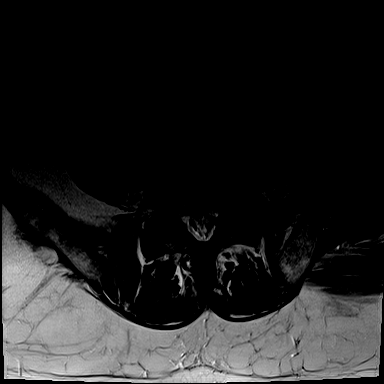
[im 3/19]
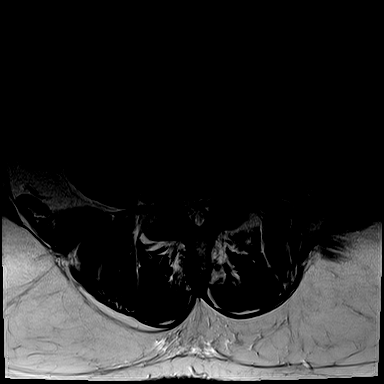
[im 5/19]
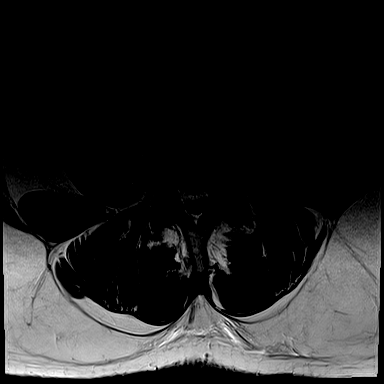
[im 7/19]
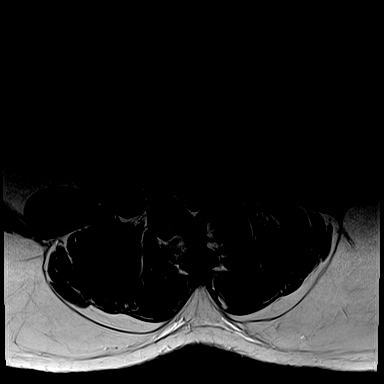
[im 10/19]
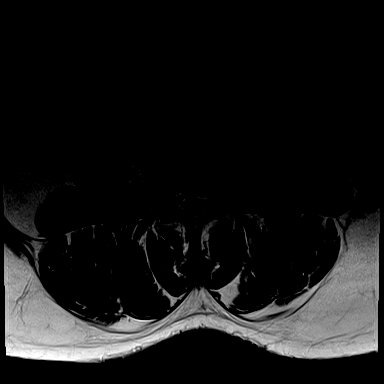
[im 12/19]
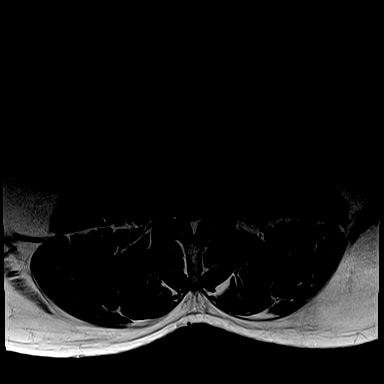
[im 14/19]
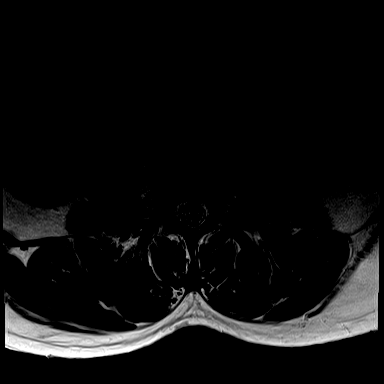
[im 16/19]
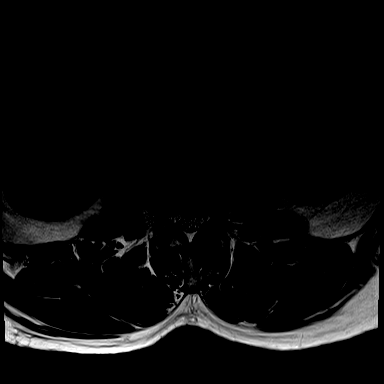
[im 19/19]
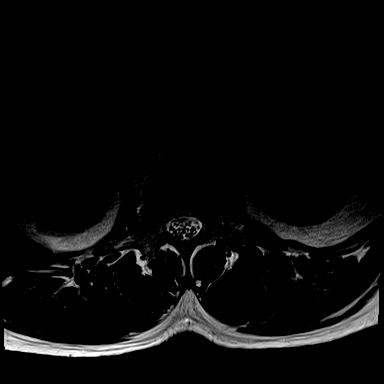

[Series 20: T2 · axial · 5.0mm · 0.57mm/px · z∈[-529,-480]mm · 4 of 9 slices shown (3 of 3)]
[im 1/9]
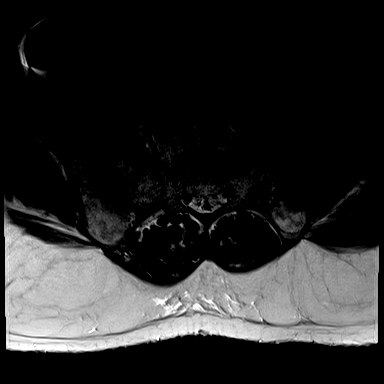
[im 3/9]
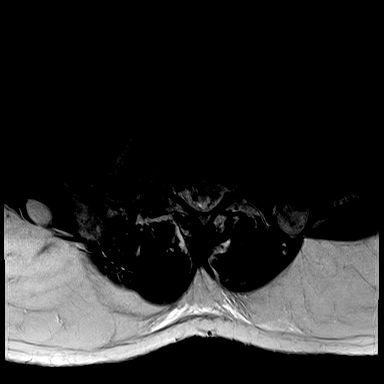
[im 6/9]
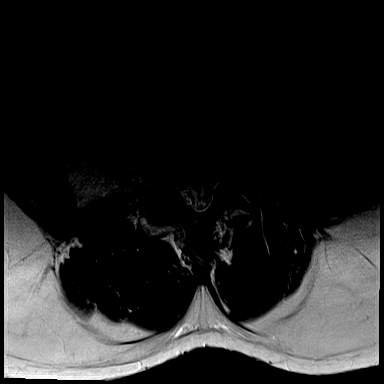
[im 9/9]
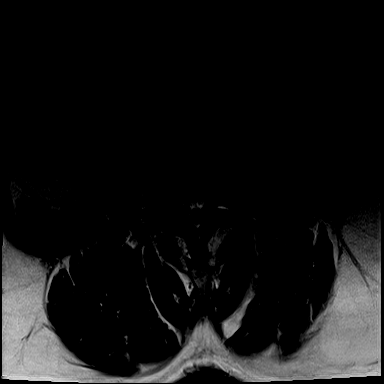

[Series 21: T1 · axial · 5.0mm · 0.34mm/px · 1 of 19 slices shown (2 of 2)]
[im 1/19]
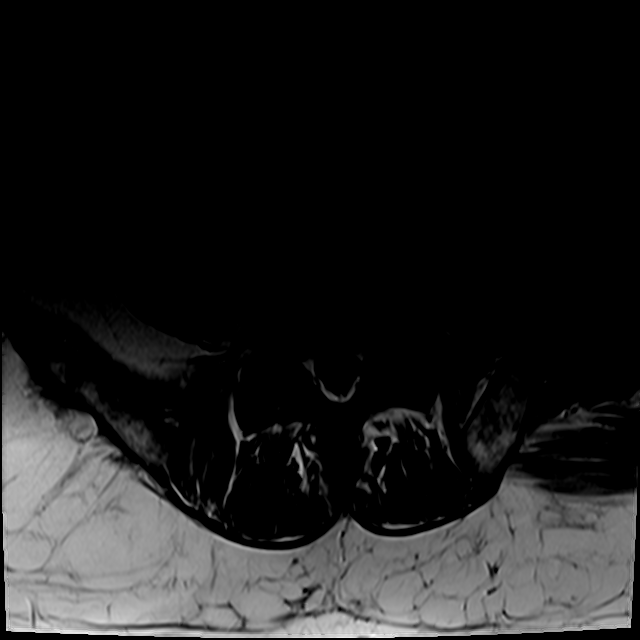

[29 of 48 positions shown; findings below may reference images not displayed]

FINDINGS: MRI CERVICAL SPINE FINDINGS

Alignment: Mild reversal of the cervical curvature.

Vertebrae: Mild chronic wedging of C3. No acute fracture, evidence
of discitis, or bone lesion.

Cord: Flattening of the spinal cord with increase T2 signal at C3-4
and C4-5 and reduce cord volume at this level.

Posterior Fossa, vertebral arteries, paraspinal tissues: Negative.

Disc levels:

C2-3: Facet degenerative changes resulting in mild right neural
foraminal narrowing. No spinal canal stenosis.

C3-4: Posterior disc protrusion resulting in moderate to severe
spinal canal stenosis with flattening of the spinal cord and
increased T2 cord signal suggestive of myelomalacia. Uncovertebral
and facet degenerative changes resulting in severe right and
moderate left neural foraminal narrowing.

C4-5: Posterior disc protrusion resulting in moderate spinal canal
stenosis with flattening of the anterior cord and increased cord T2
signal suggestive of myelomalacia. Uncovertebral and facet
degenerative changes resulting in severe bilateral neural foraminal
narrowing.

C5-6: Posterior disc protrusion resulting in mild spinal canal
stenosis. Uncovertebral and facet degenerative changes resulting in
moderate right and mild left neural foraminal narrowing.

C6-7: Posterior disc protrusion resulting in mild spinal canal
stenosis. Uncovertebral and facet degenerative changes resulting in
mild right and moderate left neural foraminal narrowing.

C7-T1: Tiny posterior disc protrusion without significant spinal
canal stenosis. Uncovertebral and facet degenerative changes
resulting in moderate bilateral neural foraminal narrowing, right
greater than left.

MRI THORACIC SPINE FINDINGS

Alignment:  Physiologic.

Vertebrae: No fracture, evidence of discitis, or bone lesion.

Cord:  Normal signal and morphology.

Paraspinal and other soft tissues: Negative.

Disc levels: No significant disc herniation, spinal canal or neural
foraminal stenosis at any thoracic level.

MRI LUMBAR SPINE FINDINGS

Segmentation:  Standard.

Alignment:  Physiologic.

Vertebrae: No fracture, evidence of discitis, or bone lesion of the
lumbar spine. Partially evaluated T1 hypointense lesion within the
left iliac bone (series 22, image 8).

Conus medullaris and cauda equina: Conus extends to the T12 level.
Conus and cauda equina appear normal.

Paraspinal and other soft tissues: Negative.

Disc levels:

T12-L1: No spinal canal or neural foraminal stenosis.

L1-2: Shallow disc bulge. No spinal canal or neural foraminal
stenosis.

L2-3: Disc bulge, mild facet degenerative changes and ligamentum
flavum redundancy resulting in narrowing of the bilateral
subarticular zones and moderate bilateral neural foraminal
narrowing.

L3-4: Disc bulge, facet degenerative changes and ligamentum flavum
redundancy resulting in narrowing of the bilateral subarticular
zones and moderate bilateral neural foraminal narrowing.

L4-5: Disc bulge, moderate facet degenerative changes and ligamentum
flavum redundancy resulting in narrowing of the bilateral
subarticular zones and moderate bilateral neural foraminal
narrowing.

L5-S1: Disc bulge and facet degenerative changes resulting in severe
right and mild-to-moderate left neural foraminal narrowing. No
spinal canal stenosis.
IMPRESSION: 1. Multilevel degenerative changes of the cervical spine with
moderate to severe spinal canal stenosis at C3-4 and moderate at
C4-5 with flattening of the spinal cord and increased T2 cord signal
suggestive of myelomalacia.
2. Multilevel cervical neural foraminal stenosis, severe bilaterally
at C4-5.
3. No significant spinal canal or neural foraminal stenosis of the
thoracic spine.
4. Multilevel lumbar spondylosis with narrowing of the bilateral
subarticular zones and moderate bilateral neural foraminal narrowing
at L2-3, L3-4 and L4-5. Severe right neural foraminal narrowing at
L5-S1.
5. A T1 hypointense lesion partially visualized in the left iliac
bone. Correlation plain films suggested.

## 2020-04-28 MED ORDER — GADOBUTROL 1 MMOL/ML IV SOLN
8.0000 mL | Freq: Once | INTRAVENOUS | Status: AC | PRN
  Administered 2020-04-28: 8 mL via INTRAVENOUS

## 2020-04-28 NOTE — Progress Notes (Signed)
All chart & note are being faxed over to [facility] now. TBA appt will be faxed back.

## 2020-04-28 NOTE — Progress Notes (Signed)
I am ordering urgent referral to neurosurgery for this patient based on his MRI of cervical and lumbar spine.  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  I did consult with Dr. Karilyn Cota regarding this and we are both in agreement that patient needs to see neurosurgeon fairly urgently for assistance with management.  Thank you.

## 2020-04-29 ENCOUNTER — Other Ambulatory Visit: Payer: Self-pay

## 2020-04-29 ENCOUNTER — Emergency Department (HOSPITAL_COMMUNITY)
Admission: EM | Admit: 2020-04-29 | Discharge: 2020-04-29 | Disposition: A | Discharge status: Home / Self Care | Payer: Medicare Other | Attending: Emergency Medicine | Admitting: Emergency Medicine

## 2020-04-29 ENCOUNTER — Emergency Department (HOSPITAL_COMMUNITY): Payer: Medicare Other

## 2020-04-29 ENCOUNTER — Encounter (HOSPITAL_COMMUNITY): Payer: Self-pay

## 2020-04-29 DIAGNOSIS — Z87891 Personal history of nicotine dependence: Secondary | ICD-10-CM | POA: Diagnosis not present

## 2020-04-29 DIAGNOSIS — R0789 Other chest pain: Secondary | ICD-10-CM | POA: Diagnosis not present

## 2020-04-29 DIAGNOSIS — Z96641 Presence of right artificial hip joint: Secondary | ICD-10-CM | POA: Diagnosis not present

## 2020-04-29 DIAGNOSIS — M4802 Spinal stenosis, cervical region: Secondary | ICD-10-CM

## 2020-04-29 DIAGNOSIS — Z91010 Allergy to peanuts: Secondary | ICD-10-CM | POA: Diagnosis not present

## 2020-04-29 DIAGNOSIS — M5030 Other cervical disc degeneration, unspecified cervical region: Secondary | ICD-10-CM | POA: Diagnosis not present

## 2020-04-29 DIAGNOSIS — I10 Essential (primary) hypertension: Secondary | ICD-10-CM | POA: Insufficient documentation

## 2020-04-29 DIAGNOSIS — J45909 Unspecified asthma, uncomplicated: Secondary | ICD-10-CM | POA: Diagnosis not present

## 2020-04-29 DIAGNOSIS — Z79899 Other long term (current) drug therapy: Secondary | ICD-10-CM | POA: Diagnosis not present

## 2020-04-29 DIAGNOSIS — M47816 Spondylosis without myelopathy or radiculopathy, lumbar region: Secondary | ICD-10-CM

## 2020-04-29 DIAGNOSIS — M47812 Spondylosis without myelopathy or radiculopathy, cervical region: Secondary | ICD-10-CM

## 2020-04-29 DIAGNOSIS — R079 Chest pain, unspecified: Secondary | ICD-10-CM | POA: Diagnosis not present

## 2020-04-29 DIAGNOSIS — M5136 Other intervertebral disc degeneration, lumbar region: Secondary | ICD-10-CM | POA: Diagnosis not present

## 2020-04-29 LAB — BASIC METABOLIC PANEL
Anion gap: 10 (ref 5–15)
BUN: 13 mg/dL (ref 8–23)
CO2: 26 mmol/L (ref 22–32)
Calcium: 9.1 mg/dL (ref 8.9–10.3)
Chloride: 103 mmol/L (ref 98–111)
Creatinine, Ser: 0.9 mg/dL (ref 0.61–1.24)
GFR, Estimated: >60 mL/min (ref >60)
Glucose, Bld: 118 mg/dL — ABNORMAL HIGH (ref 70–99)
Potassium: 3.3 mmol/L — ABNORMAL LOW (ref 3.5–5.1)
Sodium: 139 mmol/L (ref 135–145)

## 2020-04-29 LAB — TROPONIN I (HIGH SENSITIVITY)
Troponin I (High Sensitivity): 2 ng/L (ref <18)
Troponin I (High Sensitivity): 3 ng/L (ref <18)

## 2020-04-29 LAB — RAPID URINE DRUG SCREEN, HOSP PERFORMED
Amphetamines: NOT DETECTED
Barbiturates: NOT DETECTED
Benzodiazepines: NOT DETECTED
Cocaine: NOT DETECTED
Opiates: NOT DETECTED
Tetrahydrocannabinol: NOT DETECTED

## 2020-04-29 LAB — CBC
HCT: 37.2 % — ABNORMAL LOW (ref 39.0–52.0)
Hemoglobin: 11.7 g/dL — ABNORMAL LOW (ref 13.0–17.0)
MCH: 26.4 pg (ref 26.0–34.0)
MCHC: 31.5 g/dL (ref 30.0–36.0)
MCV: 84 fL (ref 80.0–100.0)
Platelets: 193 10*3/uL (ref 150–400)
RBC: 4.43 MIL/uL (ref 4.22–5.81)
RDW: 14.4 % (ref 11.5–15.5)
WBC: 7.2 10*3/uL (ref 4.0–10.5)
nRBC: 0 % (ref 0.0–0.2)

## 2020-04-29 LAB — ETHANOL: Alcohol, Ethyl (B): 69 mg/dL — ABNORMAL HIGH (ref <10)

## 2020-04-29 IMAGING — DX DG CHEST 2V
2 series · 2 of 2 positions shown · non-contrast
Comparison: PA and lateral chest [DATE].

CLINICAL DATA: Onset chest pain today.

EXAM:
CHEST - 2 VIEW

[chest pa]
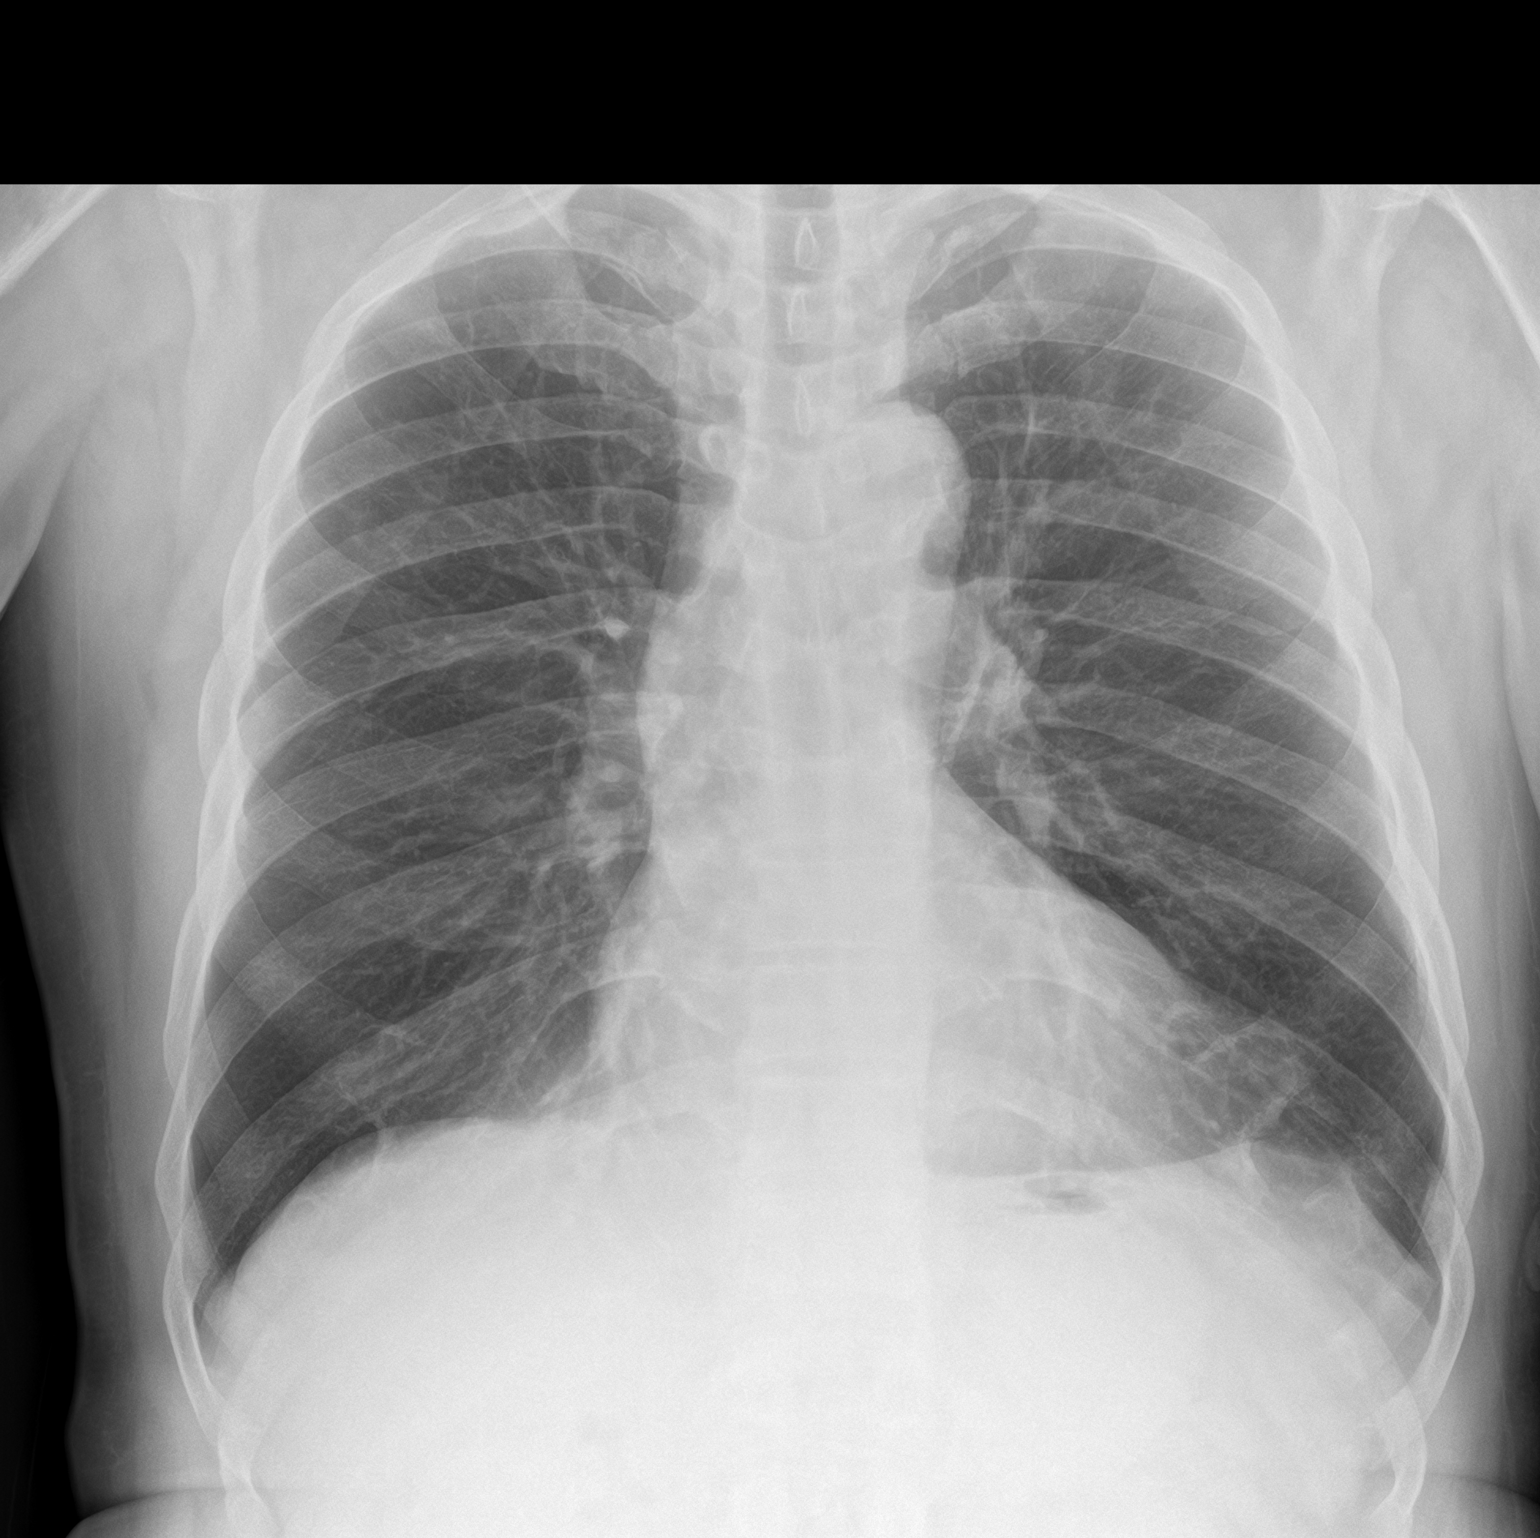

[chest lat]
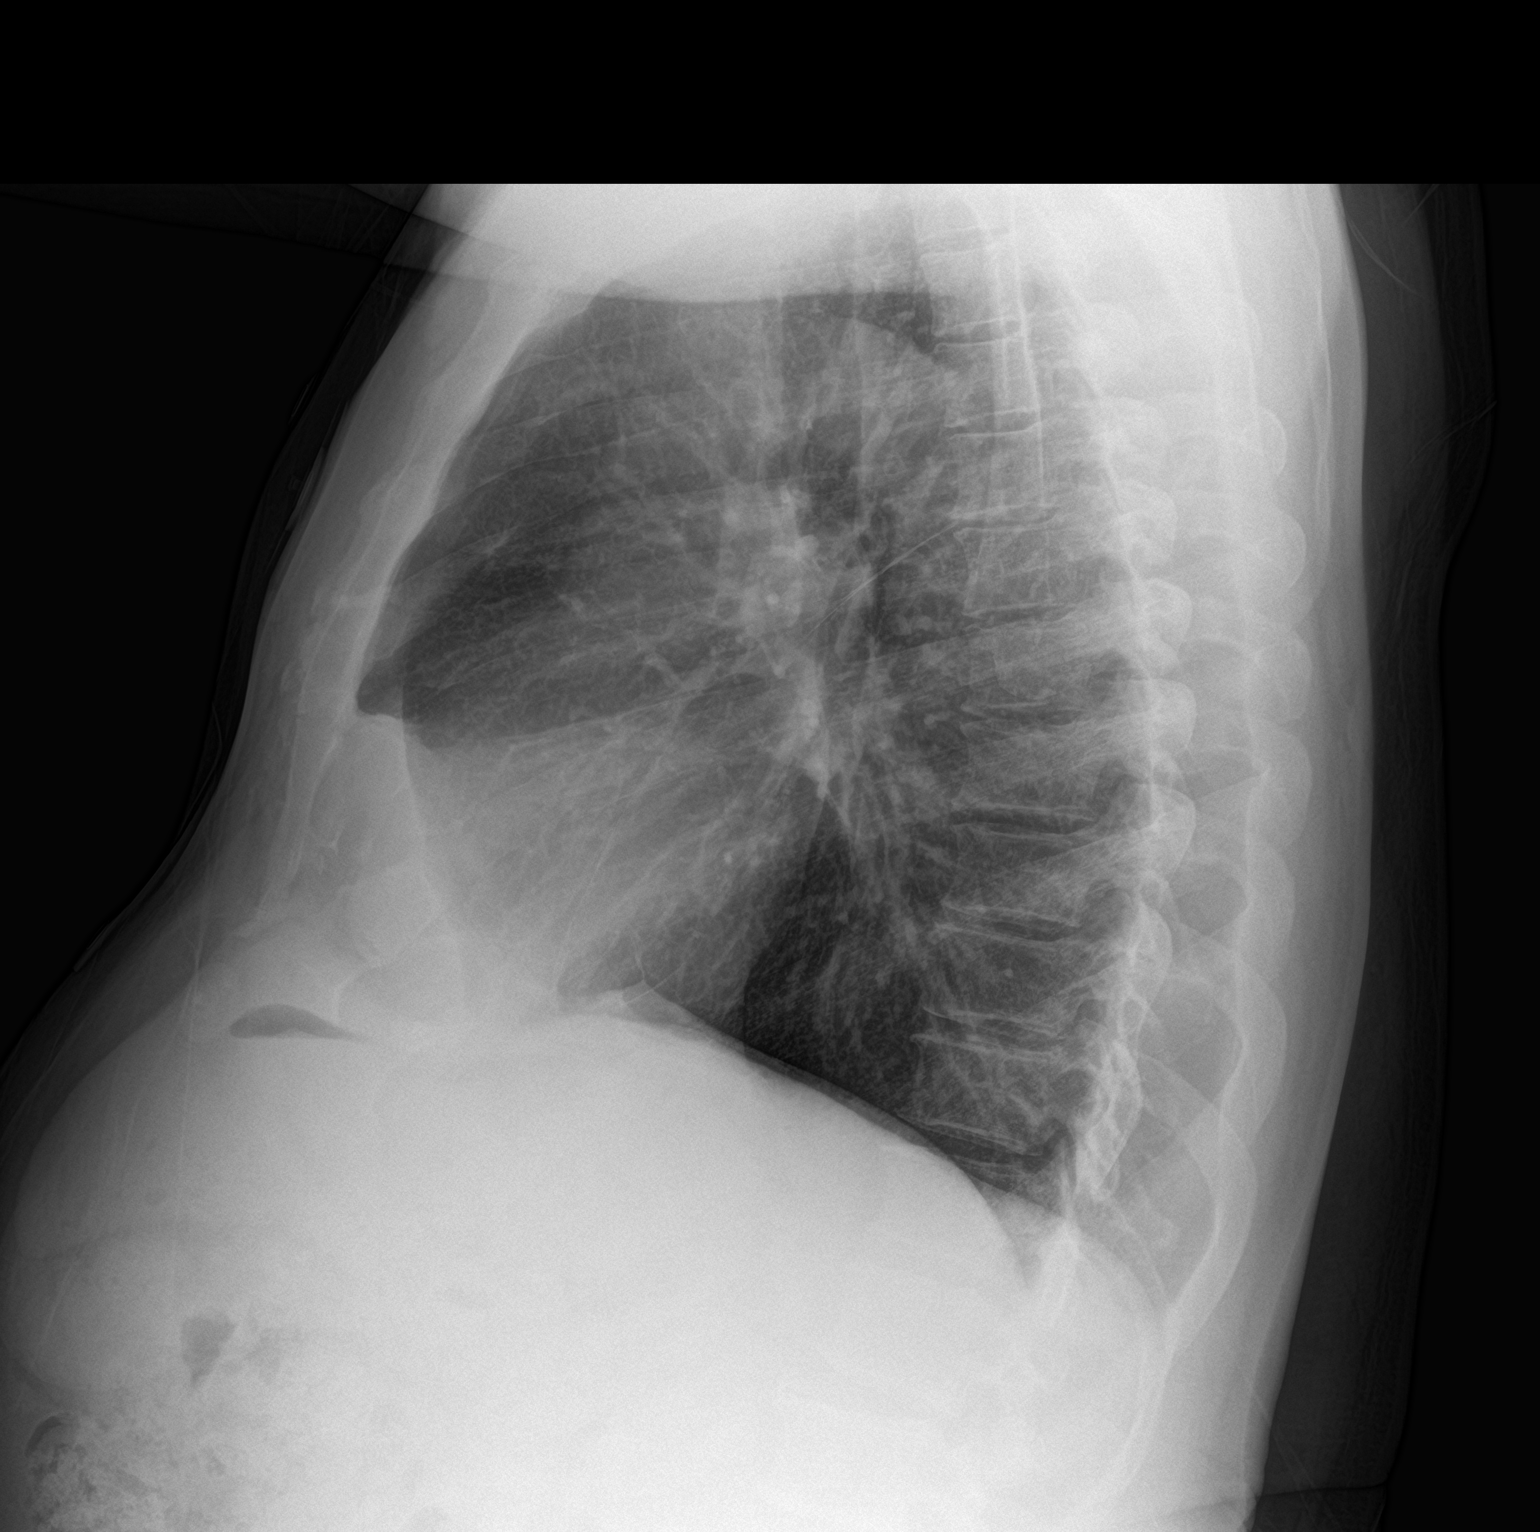

[2 of 2 positions shown; findings below may reference images not displayed]

FINDINGS: Lungs clear. Heart size normal. Aortic atherosclerosis. No
pneumothorax or pleural fluid. No acute or focal bony abnormality.
IMPRESSION: No acute disease.

Aortic Atherosclerosis ([C8]-[C8]).

## 2020-04-29 NOTE — Discharge Instructions (Addendum)
Testing today does not show any heart problems or lung problems causing her discomfort.  You do have significant spine problems, mostly in your neck, that will need to be addressed.  Dr. Karilyn Cota is going to help you see a neurosurgeon who can help you with this problem.  For pain, use Tylenol.  Get plenty of rest.  Contact Dr. Karilyn Cota to help arrange further treatment as needed.

## 2020-04-29 NOTE — ED Triage Notes (Signed)
Pt reports going to [place] today and drank a cup of gin, then chest started "feeling funny". Went home and drank a beer  Pain in the center of the chest

## 2020-04-29 NOTE — ED Provider Notes (Signed)
[facility] EMERGENCY DEPARTMENT Provider Note   CSN: [id number] Arrival date & time: 04/29/20  1802     History Chief Complaint  Patient presents with  . Chest Pain    Kevin Villa is a 64 y.o. male.  HPI He is here for evaluation of palpitations, and vague chest discomfort, on and off, 4 weeks.  He also has dyspnea on exertion and general weakness.  He was evaluated by his PCP about a month ago and had MRI imaging done to evaluate some of the symptoms.  This was done yesterday.  The patient has not heard the results.  Results are available in the EMR and indicate that he has cervical spinal stenosis with degenerative changes in lumbar degenerative changes.  He also had MRI of the neck and thoracic spine which did not show any acute abnormalities.  Patient's primary care provider plans to refer him to neurosurgery.  Patient does not have prior cardiac history.  He is not a smoker.  He drinks alcohol occasionally and noticed today after drinking gin that he felt weaker than usual.  He denies fever, chills, cough, focal weakness or paresthesia.  There are no other known modifying factors.    Past Medical History:  Diagnosis Date  . Arthritis    osteoarthritis   . Asthma    childhood   . GERD (gastroesophageal reflux disease)    occ  . Hematuria   . Hyperlipidemia   . Hypertension   . Pneumonia    years ago   . Pre-diabetes     Patient Active Problem List   Diagnosis Date Noted  . Blurred vision 06/03/2019  . Dysuria 05/21/2019  . Hematuria 04/21/2019  . Tremor 04/21/2019  . Vitamin D deficiency 04/21/2019  . Hyperlipemia 04/21/2019  . Obese 11/09/2017  . 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 Prediabetes 09/06/2017  . HTN, goal below 140/90 09/06/2017  . Alcohol abuse 09/06/2017  . High risk medication use 09/06/2017  . Overweight (BMI 25.0-29.9) 09/06/2017    Past Surgical History:  Procedure Laterality Date  . HERNIA REPAIR    . TOTAL HIP ARTHROPLASTY Right  11/06/2017   Procedure: RIGHT TOTAL HIP ARTHROPLASTY ANTERIOR APPROACH;  Surgeon: Durene Romans, MD;  Location: [facility];  Service: Orthopedics;  Laterality: Right;  70 mins       Family History  Problem Relation Age of Onset  . Diabetes Mother   . Hypertension Mother   . Hypertension Father   . Cancer Brother        Unsure of type  . Hypertension Sister   . Diabetes Sister   . Hypertension Sister   . Hypertension Sister   . Hypertension Sister     Social History   Tobacco Use  . Smoking status: Former Smoker    Packs/day: 0.25    Years: 30.00    Pack years: 7.50    Types: Cigarettes    Quit date: 2004    Years since quitting: 17.9  . Smokeless tobacco: Never Used  Vaping Use  . Vaping Use: Never used  Substance Use Topics  . Alcohol use: Yes    Alcohol/week: 2.0 standard drinks    Types: 2 Cans of beer per week    Comment: drank gin and beer today  . Drug use: No    Home Medications Prior to Admission medications   Medication Sig Start Date End Date Taking? Authorizing Provider  amLODipine (NORVASC) 5 MG tablet Take 1 tablet (5 mg total)  by mouth daily. 03/11/20  Yes Gosrani, Nimish C, MD  pravastatin (PRAVACHOL) 20 MG tablet Take 1 tablet (20 mg total) by mouth every evening. 04/21/19  Yes Elenore Paddy, NP  senna-docusate (SENOKOT-S) 8.6-50 MG tablet Take 1 tablet by mouth 2 (two) times daily. 03/11/20  Yes Wilson Singer, MD  amantadine (SYMMETREL) 100 MG capsule  06/25/19   [provider]  doxycycline (VIBRAMYCIN) 100 MG capsule Take 1 capsule (100 mg total) by mouth 2 (two) times daily. Patient not taking: Reported on 04/29/2020 05/21/19   Malen Gauze, MD  tamsulosin (FLOMAX) 0.4 MG CAPS capsule Take 1 capsule (0.4 mg total) by mouth daily. Patient not taking: Reported on 04/29/2020 07/02/19   Malen Gauze, MD    Allergies    Peanut-containing drug products and Tomato  Review of Systems   Review of Systems  All other systems  reviewed and are negative.   Physical Exam Updated Vital Signs BP (!) 149/86   Pulse 88   Temp 97.9 F (36.6 C)   Resp (!) 27   Ht  (1.778 m)   Wt 107 kg   SpO2 100%   BMI 33.86 kg/m   Physical Exam Vitals and nursing note reviewed.  Constitutional:      General: He is not in acute distress.    Appearance: He is well-developed. He is not ill-appearing, toxic-appearing or diaphoretic.  HENT:     Head: Normocephalic and atraumatic.     Right Ear: External ear normal.     Left Ear: External ear normal.  Eyes:     Conjunctiva/sclera: Conjunctivae normal.     Pupils: Pupils are equal, round, and reactive to light.  Neck:     Trachea: Phonation normal.  Cardiovascular:     Rate and Rhythm: Normal rate and regular rhythm.     Heart sounds: Normal heart sounds.  Pulmonary:     Effort: Pulmonary effort is normal.     Breath sounds: Normal breath sounds.  Chest:     Chest wall: No tenderness.  Abdominal:     General: There is no distension.     Palpations: Abdomen is soft.     Tenderness: There is no abdominal tenderness.  Musculoskeletal:        General: Normal range of motion.     Cervical back: Normal range of motion and neck supple.     Comments: He sits up easily and moves on the bed without difficulty.  No large joint deformities or tenderness.  Back nontender to palpation.  Skin:    General: Skin is warm and dry.  Neurological:     Mental Status: He is alert and oriented to person, place, and time.     Cranial Nerves: No cranial nerve deficit.     Sensory: No sensory deficit.     Motor: No abnormal muscle tone.     Coordination: Coordination normal.  Psychiatric:        Mood and Affect: Mood normal.        Behavior: Behavior normal.        Thought Content: Thought content normal.        Judgment: Judgment normal.     ED Results / Procedures / Treatments   Labs (all labs ordered are listed, but only abnormal results are displayed) Labs Reviewed    BASIC METABOLIC PANEL - Abnormal; Notable for the following components:      Result Value   Potassium 3.3 (*)  Glucose, Bld 118 (*)    All other components within normal limits  CBC - Abnormal; Notable for the following components:   Hemoglobin 11.7 (*)    HCT 37.2 (*)    All other components within normal limits  ETHANOL - Abnormal; Notable for the following components:   Alcohol, Ethyl (B) 69 (*)    All other components within normal limits  RAPID URINE DRUG SCREEN, HOSP PERFORMED  TROPONIN I (HIGH SENSITIVITY)  TROPONIN I (HIGH SENSITIVITY)    EKG EKG Interpretation  Date/Time:  Thursday April 29 2020 18:43:34 EST Ventricular Rate:  88 PR Interval:  186 QRS Duration: 94 QT Interval:  358 QTC Calculation: 433 R Axis:   44 Text Interpretation: Normal sinus rhythm Nonspecific T wave abnormality Abnormal ECG Since last tracing Nonspecific T wave abnormality now evident in Diffuse leads Otherwise no significant change Confirmed by Mancel BaleWentz, Harley Mccartney [phone]([id number]) on 04/29/2020 7:15:58 PM   Radiology DG Chest 2 View  Result Date: 04/29/2020 CLINICAL DATA:  Onset chest pain today. EXAM: CHEST - 2 VIEW COMPARISON:  PA and lateral chest 10/11/2017. FINDINGS: Lungs clear. Heart size normal. Aortic atherosclerosis. No pneumothorax or pleural fluid. No acute or focal bony abnormality. IMPRESSION: No acute disease. Aortic Atherosclerosis (ICD10-I70.0). Electronically Signed   By: Drusilla Kannerhomas  Dalessio M.D.   On: 04/29/2020 19:23   MR Brain W Wo Contrast  Result Date: 04/28/2020 CLINICAL DATA:  Headache, new or worsening. EXAM: MRI HEAD WITHOUT AND WITH CONTRAST TECHNIQUE: Multiplanar, multiecho pulse sequences of the brain and surrounding structures were obtained without and with intravenous contrast. CONTRAST:  8mL GADAVIST GADOBUTROL 1 MMOL/ML IV SOLN COMPARISON:  Head CT March 03, 2020. FINDINGS: Brain: No acute infarction, hemorrhage, hydrocephalus, extra-axial collection or mass lesion. 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 No significant white matter disease. No focus of abnormal contrast enhancement. Vascular: Normal flow voids. Skull and upper cervical spine: Normal marrow signal. Sinuses/Orbits: Dehiscence of the left lamina papyracea appear unchanged. Paranasal sinuses are essentially clear. Other: None. IMPRESSION: 1. No acute intracranial abnormality. 2. 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 3. Otherwise unremarkable MRI of the brain. Electronically Signed   By: Baldemar LenisKatyucia  De Macedo Rodrigues M.D.   On: 04/28/2020 11:48   MR Cervical Spine Wo Contrast  Result Date: 04/28/2020 CLINICAL DATA:  Cervical radiculopathy. Bilateral arm weakness. Tremors of nervous system. Paresthesia of both hands. Mid back pain, neuro deficit. Low back pain, progressive neurological deficit. EXAM: MRI CERVICAL, THORACIC AND LUMBAR SPINE WITHOUT CONTRAST TECHNIQUE: Multiplanar and multiecho pulse sequences of the cervical spine, to include the craniocervical junction and cervicothoracic junction, and thoracic and lumbar spine, were obtained without intravenous contrast. COMPARISON:  None. FINDINGS: MRI CERVICAL SPINE FINDINGS Alignment: Mild reversal of the cervical curvature. Vertebrae: Mild chronic wedging of C3. No acute fracture, evidence of discitis, or bone lesion. Cord: Flattening of the spinal cord with increase T2 signal at C3-4 and C4-5 and reduce cord volume at this level. Posterior Fossa, vertebral arteries, paraspinal tissues: Negative. Disc levels: C2-3: Facet degenerative changes resulting in mild right neural foraminal narrowing. No spinal canal stenosis. C3-4: Posterior disc protrusion resulting in moderate to severe spinal canal stenosis with flattening of the spinal cord and increased T2 cord signal suggestive of myelomalacia. Uncovertebral and facet degenerative changes resulting in severe right and moderate left neural foraminal narrowing. C4-5:  Posterior disc protrusion resulting in moderate spinal canal stenosis with flattening of the anterior cord and increased cord  T2 signal suggestive of myelomalacia. Uncovertebral and facet degenerative changes resulting in severe bilateral neural foraminal narrowing. C5-6: Posterior disc protrusion resulting in mild spinal canal stenosis. Uncovertebral and facet degenerative changes resulting in moderate right and mild left neural foraminal narrowing. C6-7: Posterior disc protrusion resulting in mild spinal canal stenosis. Uncovertebral and facet degenerative changes resulting in mild right and moderate left neural foraminal narrowing. C7-T1: Tiny posterior disc protrusion without significant spinal canal stenosis. Uncovertebral and facet degenerative changes resulting in moderate bilateral neural foraminal narrowing, right greater than left. MRI THORACIC SPINE FINDINGS Alignment:  Physiologic. Vertebrae: No fracture, evidence of discitis, or bone lesion. Cord:  Normal signal and morphology. Paraspinal and other soft tissues: Negative. Disc levels: No significant disc herniation, spinal canal or neural foraminal stenosis at any thoracic level. MRI LUMBAR SPINE FINDINGS Segmentation:  Standard. Alignment:  Physiologic. Vertebrae: No fracture, evidence of discitis, or bone lesion of the lumbar spine. Partially evaluated T1 hypointense lesion within the left iliac bone (series 22, image 8). Conus medullaris and cauda equina: Conus extends to the T12 level. Conus and cauda equina appear normal. Paraspinal and other soft tissues: Negative. Disc levels: T12-L1: No spinal canal or neural foraminal stenosis. L1-2: Shallow disc bulge. No spinal canal or neural foraminal stenosis. L2-3: Disc bulge, mild facet degenerative changes and ligamentum flavum redundancy resulting in narrowing of the bilateral subarticular zones and moderate bilateral neural foraminal narrowing. L3-4: Disc bulge, facet degenerative changes and  ligamentum flavum redundancy resulting in narrowing of the bilateral subarticular zones and moderate bilateral neural foraminal narrowing. L4-5: Disc bulge, moderate facet degenerative changes and ligamentum flavum redundancy resulting in narrowing of the bilateral subarticular zones and moderate bilateral neural foraminal narrowing. L5-S1: Disc bulge and facet degenerative changes resulting in severe right and mild-to-moderate left neural foraminal narrowing. No spinal canal stenosis. IMPRESSION: 1. Multilevel degenerative changes of the cervical spine with moderate to severe spinal canal stenosis at C3-4 and moderate at C4-5 with flattening of the spinal cord and increased T2 cord signal suggestive of myelomalacia. 2. Multilevel cervical neural foraminal stenosis, severe bilaterally at C4-5. 3. No significant spinal canal or neural foraminal stenosis of the thoracic spine. 4. Multilevel lumbar spondylosis with narrowing of the bilateral subarticular zones and moderate bilateral neural foraminal narrowing at L2-3, L3-4 and L4-5. Severe right neural foraminal narrowing at L5-S1. 5. A T1 hypointense lesion partially visualized in the left iliac bone. Correlation plain films suggested. Electronically Signed   By: Baldemar Lenis M.D.   On: 04/28/2020 11:38   MR Thoracic Spine Wo Contrast  Result Date: 04/28/2020 CLINICAL DATA:  Cervical radiculopathy. Bilateral arm weakness. Tremors of nervous system. Paresthesia of both hands. Mid back pain, neuro deficit. Low back pain, progressive neurological deficit. EXAM: MRI CERVICAL, THORACIC AND LUMBAR SPINE WITHOUT CONTRAST TECHNIQUE: Multiplanar and multiecho pulse sequences of the cervical spine, to include the craniocervical junction and cervicothoracic junction, and thoracic and lumbar spine, were obtained without intravenous contrast. COMPARISON:  None. FINDINGS: MRI CERVICAL SPINE FINDINGS Alignment: Mild reversal of the cervical curvature. Vertebrae:  Mild chronic wedging of C3. No acute fracture, evidence of discitis, or bone lesion. Cord: Flattening of the spinal cord with increase T2 signal at C3-4 and C4-5 and reduce cord volume at this level. Posterior Fossa, vertebral arteries, paraspinal tissues: Negative. Disc levels: C2-3: Facet degenerative changes resulting in mild right neural foraminal narrowing. No spinal canal stenosis. C3-4: Posterior disc protrusion resulting in moderate to severe spinal canal stenosis with flattening of  the spinal cord and increased T2 cord signal suggestive of myelomalacia. Uncovertebral and facet degenerative changes resulting in severe right and moderate left neural foraminal narrowing. C4-5: Posterior disc protrusion resulting in moderate spinal canal stenosis with flattening of the anterior cord and increased cord T2 signal suggestive of myelomalacia. Uncovertebral and facet degenerative changes resulting in severe bilateral neural foraminal narrowing. C5-6: Posterior disc protrusion resulting in mild spinal canal stenosis. Uncovertebral and facet degenerative changes resulting in moderate right and mild left neural foraminal narrowing. C6-7: Posterior disc protrusion resulting in mild spinal canal stenosis. Uncovertebral and facet degenerative changes resulting in mild right and moderate left neural foraminal narrowing. C7-T1: Tiny posterior disc protrusion without significant spinal canal stenosis. Uncovertebral and facet degenerative changes resulting in moderate bilateral neural foraminal narrowing, right greater than left. MRI THORACIC SPINE FINDINGS Alignment:  Physiologic. Vertebrae: No fracture, evidence of discitis, or bone lesion. Cord:  Normal signal and morphology. Paraspinal and other soft tissues: Negative. Disc levels: No significant disc herniation, spinal canal or neural foraminal stenosis at any thoracic level. MRI LUMBAR SPINE FINDINGS Segmentation:  Standard. Alignment:  Physiologic. Vertebrae: No  fracture, evidence of discitis, or bone lesion of the lumbar spine. Partially evaluated T1 hypointense lesion within the left iliac bone (series 22, image 8). Conus medullaris and cauda equina: Conus extends to the T12 level. Conus and cauda equina appear normal. Paraspinal and other soft tissues: Negative. Disc levels: T12-L1: No spinal canal or neural foraminal stenosis. L1-2: Shallow disc bulge. No spinal canal or neural foraminal stenosis. L2-3: Disc bulge, mild facet degenerative changes and ligamentum flavum redundancy resulting in narrowing of the bilateral subarticular zones and moderate bilateral neural foraminal narrowing. L3-4: Disc bulge, facet degenerative changes and ligamentum flavum redundancy resulting in narrowing of the bilateral subarticular zones and moderate bilateral neural foraminal narrowing. L4-5: Disc bulge, moderate facet degenerative changes and ligamentum flavum redundancy resulting in narrowing of the bilateral subarticular zones and moderate bilateral neural foraminal narrowing. L5-S1: Disc bulge and facet degenerative changes resulting in severe right and mild-to-moderate left neural foraminal narrowing. No spinal canal stenosis. IMPRESSION: 1. Multilevel degenerative changes of the cervical spine with moderate to severe spinal canal stenosis at C3-4 and moderate at C4-5 with flattening of the spinal cord and increased T2 cord signal suggestive of myelomalacia. 2. Multilevel cervical neural foraminal stenosis, severe bilaterally at C4-5. 3. No significant spinal canal or neural foraminal stenosis of the thoracic spine. 4. Multilevel lumbar spondylosis with narrowing of the bilateral subarticular zones and moderate bilateral neural foraminal narrowing at L2-3, L3-4 and L4-5. Severe right neural foraminal narrowing at L5-S1. 5. A T1 hypointense lesion partially visualized in the left iliac bone. Correlation plain films suggested. Electronically Signed   By: Baldemar Lenis M.D.   On: 04/28/2020 11:38   MR LUMBAR SPINE WO CONTRAST  Result Date: 04/28/2020 CLINICAL DATA:  Cervical radiculopathy. Bilateral arm weakness. Tremors of nervous system. Paresthesia of both hands. Mid back pain, neuro deficit. Low back pain, progressive neurological deficit. EXAM: MRI CERVICAL, THORACIC AND LUMBAR SPINE WITHOUT CONTRAST TECHNIQUE: Multiplanar and multiecho pulse sequences of the cervical spine, to include the craniocervical junction and cervicothoracic junction, and thoracic and lumbar spine, were obtained without intravenous contrast. COMPARISON:  None. FINDINGS: MRI CERVICAL SPINE FINDINGS Alignment: Mild reversal of the cervical curvature. Vertebrae: Mild chronic wedging of C3. No acute fracture, evidence of discitis, or bone lesion. Cord: Flattening of the spinal cord with increase T2 signal at C3-4 and C4-5 and  reduce cord volume at this level. Posterior Fossa, vertebral arteries, paraspinal tissues: Negative. Disc levels: C2-3: Facet degenerative changes resulting in mild right neural foraminal narrowing. No spinal canal stenosis. C3-4: Posterior disc protrusion resulting in moderate to severe spinal canal stenosis with flattening of the spinal cord and increased T2 cord signal suggestive of myelomalacia. Uncovertebral and facet degenerative changes resulting in severe right and moderate left neural foraminal narrowing. C4-5: Posterior disc protrusion resulting in moderate spinal canal stenosis with flattening of the anterior cord and increased cord T2 signal suggestive of myelomalacia. Uncovertebral and facet degenerative changes resulting in severe bilateral neural foraminal narrowing. C5-6: Posterior disc protrusion resulting in mild spinal canal stenosis. Uncovertebral and facet degenerative changes resulting in moderate right and mild left neural foraminal narrowing. C6-7: Posterior disc protrusion resulting in mild spinal canal stenosis. Uncovertebral and facet  degenerative changes resulting in mild right and moderate left neural foraminal narrowing. C7-T1: Tiny posterior disc protrusion without significant spinal canal stenosis. Uncovertebral and facet degenerative changes resulting in moderate bilateral neural foraminal narrowing, right greater than left. MRI THORACIC SPINE FINDINGS Alignment:  Physiologic. Vertebrae: No fracture, evidence of discitis, or bone lesion. Cord:  Normal signal and morphology. Paraspinal and other soft tissues: Negative. Disc levels: No significant disc herniation, spinal canal or neural foraminal stenosis at any thoracic level. MRI LUMBAR SPINE FINDINGS Segmentation:  Standard. Alignment:  Physiologic. Vertebrae: No fracture, evidence of discitis, or bone lesion of the lumbar spine. Partially evaluated T1 hypointense lesion within the left iliac bone (series 22, image 8). Conus medullaris and cauda equina: Conus extends to the T12 level. Conus and cauda equina appear normal. Paraspinal and other soft tissues: Negative. Disc levels: T12-L1: No spinal canal or neural foraminal stenosis. L1-2: Shallow disc bulge. No spinal canal or neural foraminal stenosis. L2-3: Disc bulge, mild facet degenerative changes and ligamentum flavum redundancy resulting in narrowing of the bilateral subarticular zones and moderate bilateral neural foraminal narrowing. L3-4: Disc bulge, facet degenerative changes and ligamentum flavum redundancy resulting in narrowing of the bilateral subarticular zones and moderate bilateral neural foraminal narrowing. L4-5: Disc bulge, moderate facet degenerative changes and ligamentum flavum redundancy resulting in narrowing of the bilateral subarticular zones and moderate bilateral neural foraminal narrowing. L5-S1: Disc bulge and facet degenerative changes resulting in severe right and mild-to-moderate left neural foraminal narrowing. No spinal canal stenosis. IMPRESSION: 1. Multilevel degenerative changes of the cervical spine  with moderate to severe spinal canal stenosis at C3-4 and moderate at C4-5 with flattening of the spinal cord and increased T2 cord signal suggestive of myelomalacia. 2. Multilevel cervical neural foraminal stenosis, severe bilaterally at C4-5. 3. No significant spinal canal or neural foraminal stenosis of the thoracic spine. 4. Multilevel lumbar spondylosis with narrowing of the bilateral subarticular zones and moderate bilateral neural foraminal narrowing at L2-3, L3-4 and L4-5. Severe right neural foraminal narrowing at L5-S1. 5. A T1 hypointense lesion partially visualized in the left iliac bone. Correlation plain films suggested. Electronically Signed   By: Baldemar Lenis M.D.   On: 04/28/2020 11:38    Procedures Procedures (including critical care time)  Medications Ordered in ED Medications - No data to display  ED Course  I have reviewed the triage vital signs and the nursing notes.  Pertinent labs & imaging results that were available during my care of the patient were reviewed by me and considered in my medical decision making (see chart for details).    MDM Rules/Calculators/A&P  Patient Vitals for the past 24 hrs:  BP Temp Pulse Resp SpO2 Height Weight  04/29/20 2230 (!) 149/86 -- 88 (!) 27 100 % -- --  04/29/20 2200 (!) 153/113 -- 84 19 99 % -- --  04/29/20 2130 136/77 -- 79 15 100 % -- --  04/29/20 2100 (!) 144/96 -- 84 17 100 % -- --  04/29/20 2058 (!) 152/81 -- 88 (!) 22 100 % -- --  04/29/20 1847 (!) 155/87 -- -- 18 -- -- --  04/29/20 1845 (!) 155/87 97.9 F (36.6 C) 91 20 98 % -- --  04/29/20 1844 -- -- -- -- -- 5\' 10"  (1.778 m) 107 kg    10:45 PM- reevaluation with update and discussion. After initial assessment and treatment, an updated evaluation reveals walking home at this time, calm and comfortable.  Findings discussed with the patient and all questions were answered.   Medical Decision Making:  This  patient is presenting for evaluation of vague symptoms, possibly cardiac and pulmonary, which does require a range of treatment options, and is a complaint that involves a moderate risk of morbidity and mortality. The differential diagnoses include ACS, PE, pneumonia, acute spinal disorder. I decided to review old records, and in summary middle-aged male presenting with ongoing symptoms, recently evaluated by his PCP with abnormal MRI imaging.  I did not require additional historical information from anyone.  Clinical Laboratory Tests Ordered, included CBC, Metabolic panel and Delta troponin. Review indicates hemoglobin slightly low.  Alcohol level elevated, potassium slightly low, glucose high.,  UDS negative Radiologic Tests Ordered, included chest x-[place].  I independently Visualized: Radiographic images, which show no infiltrate or edema   Critical Interventions-clinical evaluation, laboratory testing, observation reassessment  After These Interventions, the Patient was reevaluated and was found stable for discharge.  Nonspecific ongoing symptoms, with degenerative changes and cervical spinal stenosis.  Doubt ACS, PE or pneumonia.  Stable for discharge with outpatient management.  He is being followed closely by his PCP.  CRITICAL CARE-no Performed by: Mancel Bale  Nursing Notes Reviewed/ Care Coordinated Applicable Imaging Reviewed Interpretation of Laboratory Data incorporated into ED treatment  The patient appears reasonably screened and/or stabilized for discharge and I doubt any other medical condition or other [facility] requiring further screening, evaluation, or treatment in the ED at this time prior to discharge.  Plan: Home Medications-continue usual; Home Treatments-rest, fluids; return here if the recommended treatment, does not improve the symptoms; Recommended follow up-PCP, and is scheduled.  PCP is working on referring patient to a neurosurgeon for cervical spinal  stenosis.     Final Clinical Impression(s) / ED Diagnoses Final diagnoses:  Nonspecific chest pain  Spinal stenosis, cervical region  Degenerative joint disease of cervical and lumbar spine    Rx / DC Orders ED Discharge Orders    None       [facility], MD 04/29/20 2318

## 2020-05-06 DIAGNOSIS — I10 Essential (primary) hypertension: Secondary | ICD-10-CM | POA: Diagnosis not present

## 2020-05-06 DIAGNOSIS — G959 Disease of spinal cord, unspecified: Secondary | ICD-10-CM | POA: Diagnosis not present

## 2020-05-11 ENCOUNTER — Ambulatory Visit (INDEPENDENT_AMBULATORY_CARE_PROVIDER_SITE_OTHER): Payer: Medicare Other | Admitting: Neurology

## 2020-05-11 ENCOUNTER — Other Ambulatory Visit: Payer: Self-pay | Admitting: Neurosurgery

## 2020-05-11 ENCOUNTER — Other Ambulatory Visit: Payer: Self-pay

## 2020-05-11 ENCOUNTER — Encounter: Payer: Self-pay | Admitting: Neurology

## 2020-05-11 VITALS — BP 124/78 | HR 71 | Ht 70.0 in | Wt 237.0 lb

## 2020-05-11 DIAGNOSIS — R519 Headache, unspecified: Secondary | ICD-10-CM | POA: Diagnosis not present

## 2020-05-11 DIAGNOSIS — G8929 Other chronic pain: Secondary | ICD-10-CM

## 2020-05-11 DIAGNOSIS — G959 Disease of spinal cord, unspecified: Secondary | ICD-10-CM | POA: Diagnosis not present

## 2020-05-11 DIAGNOSIS — M542 Cervicalgia: Secondary | ICD-10-CM

## 2020-05-11 MED ORDER — TIZANIDINE HCL 4 MG PO TABS: 4.0000 mg | ORAL_TABLET | Freq: Four times a day (QID) | ORAL | 6 refills | Status: DC | PRN

## 2020-05-11 MED ORDER — NAPROXEN 500 MG PO TABS: 500.0000 mg | ORAL_TABLET | Freq: Two times a day (BID) | ORAL | 2 refills | Status: DC | PRN

## 2020-05-11 NOTE — Progress Notes (Signed)
Chief Complaint  Patient presents with  . New Patient (Initial Visit)    Reports tremors in both hands and bilateral weakness in legs. Amantadine 100mg  was not helpful. He has pending cervical neurosurgery with Dr. in January 2022. He also has concerns about having severe headaches 2-3 times each week. He will sometimes have nausea and dizziness with the headaches. He does not treat the pain on a routine bases. He uses Tylenol occasionally.     HISTORICAL  Kevin Villa is a 64 year old male, seen in request by his primary care physician Dr. 68, Nim[place] for evaluation of gait abnormality, neck pain, frequent headache, initial evaluation was on May 11, 2020.  I reviewed and summarized the referring note. PMHX. HTN HLD. History of right hip replacement  He complains of headaches since 2019, correlate with his worsening headache, bilateral upper and lower extremity paresthesia, gait abnormality, he described when he coughed, he felt shooting, electronic shock sensation throughout his spine, to his bilateral upper and lower extremity.  He also noticed increased gait abnormality, right leg the site he had right hip replacement often give out underneath him.  His headache often started from neck, spreading to occipital, parietal or temporal region, pressure, making him dizzy, it happens once or twice each week, lasting for few hours, he has tried over-the-counter Tylenol, ibuprofen with limited help,  After work-up, he was diagnosed with cervical myelopathy, is planning on has cervical decompression surgery by Dr. 2020 in January 2022.  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  ALLERGIES: Allergies  Allergen Reactions  . Peanut-Containing Drug Products Other (See Comments)    And also in food. Mouth raw  . Tomato Hives and Itching    HOME MEDICATIONS: Current Outpatient Medications  Medication Sig Dispense Refill  . amLODipine (NORVASC) 5 MG tablet Take 1 tablet (5 mg total) by mouth daily. 90 tablet 1  . pravastatin (PRAVACHOL) 20 MG tablet Take 1 tablet (20 mg total) by mouth every evening. 90 tablet 1  . senna-docusate (SENOKOT-S) 8.6-50 MG tablet Take 1 tablet by mouth 2 (two) times daily. 60 tablet 3   No current facility-administered medications for this visit.    PAST MEDICAL HISTORY: Past Medical History:  Diagnosis Date  . Arthritis    osteoarthritis   . Asthma    childhood   . Cervical spinal stenosis   . GERD (gastroesophageal reflux disease)    occ  . Headache   . Hematuria   . Hyperlipidemia   . Hypertension   . Pneumonia    years ago   . Pre-diabetes     PAST SURGICAL HISTORY: Past Surgical History:  Procedure Laterality Date  . HERNIA REPAIR    . TOTAL HIP ARTHROPLASTY Right 11/06/2017   Procedure: RIGHT TOTAL HIP ARTHROPLASTY ANTERIOR APPROACH;  Surgeon: 01/06/2018, MD;  Location: [facility];  Service: Orthopedics;  Laterality: Right;  70 mins    FAMILY HISTORY: Family History  Problem Relation Age of Onset  .  Diabetes Mother   . Hypertension Mother   . Hypertension Father   . Cancer Brother        Unsure of type  . Hypertension Sister   . Diabetes Sister   . Hypertension Sister   . Hypertension Sister   . Hypertension Sister     SOCIAL HISTORY: Social History   Socioeconomic History  . Marital status: Legally Separated    Spouse name: Not on file   . Number of children: 0  . Years of education: 11 grade  . Highest education level: 11th grade  Occupational History  . Occupation: disability  Tobacco Use  . Smoking status: Former Smoker    Packs/day: 0.25    Years: 30.00    Pack years: 7.50    Types: Cigarettes    Quit date: 2004    Years since quitting: 17.9  . Smokeless tobacco: Never Used  Vaping Use  . Vaping Use: Never used  Substance and Sexual Activity  . Alcohol use: Yes    Comment: occasionall drinks gin or beer  . Drug use: No  . Sexual activity: Never    Partners: Female  Other Topics Concern  . Not on file  Social History Narrative   Live in [place], [place]. On disability for hip pain.   Divorced, not dating.   No children.   Has friends and family.   Lives with mother or lives in his camper.   No pets.   Drinks about a 12 pack a weekend. Does not drink on a daily basis.    Does not drive b/c they expired and did not pay to get them renewed.    4 brothers and 4 sisters.    Left-handed.   No daily use of caffeine, occasional coffee.      Social Determinants of Health   Financial Resource Strain: Not on file  Food Insecurity: Not on file  Transportation Needs: Not on file  Physical Activity: Not on file  Stress: Not on file  Social Connections: Not on file  Intimate Partner Violence: Not on file     PHYSICAL EXAM   Vitals:   05/11/20 1018  BP: 124/78  Pulse: 71  Weight: 237 lb (107.5 kg)  Height: 5\' 10"  (1.778 m)   Not recorded     Body mass index is 34.01 kg/m.  PHYSICAL EXAMNIATION:  Gen: NAD, conversant, well nourised, well groomed                     Cardiovascular: Regular rate rhythm, no peripheral edema, warm, nontender. Eyes: Conjunctivae clear without exudates or hemorrhage Neck: Supple, no carotid bruits. Pulmonary: Clear to auscultation bilaterally   NEUROLOGICAL EXAM:  MENTAL STATUS: Speech:    Speech is normal; fluent and spontaneous with normal comprehension.   Cognition:     Orientation to time, place and person     Normal recent and remote memory     Normal Attention span and concentration     Normal Language, naming, repeating,spontaneous speech     Fund of knowledge   CRANIAL NERVES: CN II: Visual fields are full to confrontation. Pupils are round equal and briskly reactive to light. CN III, IV, VI: extraocular movement are normal. No ptosis. CN V: Facial sensation is intact to light touch CN VII: Face is symmetric with normal eye closure  CN VIII: Hearing is normal to causal conversation. CN IX, X: Phonation is normal. CN XI: Head turning and shoulder shrug are intact  MOTOR: Bilateral lower extremity moderate spasticity, no significant upper or lower extremity weakness,  REFLEXES: Reflexes are 3 and symmetric at the biceps, triceps, knees, and ankles. Plantar responses are flexor.  SENSORY: Intact to light touch, pinprick and vibratory sensation are intact in fingers and toes.  COORDINATION: There is no trunk or limb dysmetria noted.  GAIT/STANCE: He needs push-up to get up from seated position, stiff, unsteady, dragging right leg Romberg is absent.   DIAGNOSTIC DATA (LABS, IMAGING, TESTING) - I reviewed patient records, labs, notes, testing and imaging myself where available.   ASSESSMENT AND PLAN  Kevin Villa is a 64 y.o. male   Cervical myelopathy Frequent headaches  Likely cervicogenic  Tizanidine, NSAIDs as needed  Cervical decompression surgery will likely help his symptoms greatly  Return to clinic in 3 to 4 months following cervical decompression surgery    Levert Feinstein, M.D. Ph.D.  [facility] [address], [address] [place], [place] [address] Ph: [phone] Fax: [phone]  CC:  Wilson Singer, MD [address] [place],  [place] [address]

## 2020-05-26 NOTE — Progress Notes (Signed)
[facility] [address], [place] - [address] [place] [id number] [place] [address] [place] [address] Phone: [phone] Fax: [phone]      Your procedure is scheduled on Monday, January 3rd.  Report to [facility] Main Entrance "A" at 6:30 A.M., and check in at the Admitting office.  Call this number if you have problems the morning of surgery:  [phone]  Call [phone] if you have any questions prior to your surgery date Monday-Friday 8am-4pm    Remember:  Do not eat after midnight the night before your surgery  You may drink clear liquids until 5:30 AM the morning of your surgery.   Clear liquids allowed are: Water, Non-Citrus Juices (without pulp), Carbonated Beverages, Clear Tea, Black Coffee Only, and Gatorade    Take these medicines the morning of surgery with A SIP OF WATER   Amlodipine (Norvasc)  Pravastatin (Pravachol)    As of today, STOP taking any Aspirin (unless otherwise instructed by your surgeon) Aleve, Naproxen, Ibuprofen, Motrin, Advil, Goody's, BC's, all herbal medications, fish oil, and all vitamins.                      Do not wear jewelry            Do not wear lotions, powders, colognes, or deodorant.            Men may shave face and neck.            Do not bring valuables to the hospital.            [facility] is not responsible for any belongings or valuables.  Do NOT Smoke (Tobacco/Vaping) or drink Alcohol 24 hours prior to your procedure If you use a CPAP at night, you may bring all equipment for your overnight stay.   Contacts, glasses, dentures or bridgework may not be worn into surgery.      For patients admitted to the hospital, discharge time will be determined by your treatment team.   Patients discharged the day of surgery will not be allowed to drive home, and someone needs to stay with them for 24 hours.    Special instructions:   - Preparing For Surgery  Before surgery, you can play an important role.  Because skin is not sterile, your skin needs to be as free of germs as possible. You can reduce the number of germs on your skin by washing with CHG (chlorahexidine gluconate) Soap before surgery.  CHG is an antiseptic cleaner which kills germs and bonds with the skin to continue killing germs even after washing.    Oral Hygiene is also important to reduce your risk of infection.  Remember - BRUSH YOUR TEETH THE MORNING OF SURGERY WITH YOUR REGULAR TOOTHPASTE  Please do not use if you have an allergy to CHG or antibacterial soaps. If your skin becomes reddened/irritated stop using the CHG.  Do not shave (including legs and underarms) for at least 48 hours prior to first CHG shower. It is OK to shave your face.  Please follow these instructions carefully.   1. Shower the NIGHT BEFORE SURGERY and the MORNING OF SURGERY with CHG Soap.   2. If you chose to wash your hair, wash your hair first as usual with your normal shampoo.  3. After you shampoo, rinse your hair and body thoroughly to remove the shampoo.  4. Use CHG as you would any other liquid soap. You can apply CHG directly  to the skin and wash gently with a scrungie or a clean washcloth.   5. Apply the CHG Soap to your body ONLY FROM THE NECK DOWN.  Do not use on open wounds or open sores. Avoid contact with your eyes, ears, mouth and genitals (private parts). Wash Face and genitals (private parts)  with your normal soap.   6. Wash thoroughly, paying special attention to the area where your surgery will be performed.  7. Thoroughly rinse your body with warm water from the neck down.  8. DO NOT shower/wash with your normal soap after using and rinsing off the CHG Soap.  9. Pat yourself dry with a CLEAN TOWEL.  10. Wear CLEAN PAJAMAS to bed the night before surgery  11. Place CLEAN SHEETS on your bed the night of your first shower and DO NOT SLEEP WITH PETS.   Day of Surgery: Wear Clean/Comfortable clothing the morning of  surgery Do not apply any deodorants/lotions.   Remember to brush your teeth WITH YOUR REGULAR TOOTHPASTE.   Please read over the following fact sheets that you were given.

## 2020-05-27 ENCOUNTER — Encounter (HOSPITAL_COMMUNITY): Payer: Self-pay

## 2020-05-27 ENCOUNTER — Inpatient Hospital Stay (HOSPITAL_COMMUNITY): Admission: RE | Admit: 2020-05-27 | Payer: Medicare Other | Source: Ambulatory Visit

## 2020-05-27 ENCOUNTER — Other Ambulatory Visit: Payer: Self-pay

## 2020-05-27 ENCOUNTER — Encounter (HOSPITAL_COMMUNITY)
Admission: RE | Admit: 2020-05-27 | Discharge: 2020-05-27 | Disposition: A | Discharge status: Home / Self Care | Payer: Medicare Other | Source: Ambulatory Visit | Attending: Neurosurgery | Admitting: Neurosurgery

## 2020-05-27 DIAGNOSIS — Z01812 Encounter for preprocedural laboratory examination: Secondary | ICD-10-CM | POA: Insufficient documentation

## 2020-05-27 LAB — CBC
HCT: 41.5 % (ref 39.0–52.0)
Hemoglobin: 12.6 g/dL — ABNORMAL LOW (ref 13.0–17.0)
MCH: 25.6 pg — ABNORMAL LOW (ref 26.0–34.0)
MCHC: 30.4 g/dL (ref 30.0–36.0)
MCV: 84.2 fL (ref 80.0–100.0)
Platelets: 207 10*3/uL (ref 150–400)
RBC: 4.93 MIL/uL (ref 4.22–5.81)
RDW: 14.1 % (ref 11.5–15.5)
WBC: 6.1 10*3/uL (ref 4.0–10.5)
nRBC: 0 % (ref 0.0–0.2)

## 2020-05-27 LAB — BASIC METABOLIC PANEL
Anion gap: 9 (ref 5–15)
BUN: 16 mg/dL (ref 8–23)
CO2: 28 mmol/L (ref 22–32)
Calcium: 9.4 mg/dL (ref 8.9–10.3)
Chloride: 103 mmol/L (ref 98–111)
Creatinine, Ser: 0.96 mg/dL (ref 0.61–1.24)
GFR, Estimated: >60 mL/min (ref >60)
Glucose, Bld: 112 mg/dL — ABNORMAL HIGH (ref 70–99)
Potassium: 4.1 mmol/L (ref 3.5–5.1)
Sodium: 140 mmol/L (ref 135–145)

## 2020-05-27 LAB — SURGICAL PCR SCREEN
MRSA, PCR: NEGATIVE
Staphylococcus aureus: NEGATIVE

## 2020-05-27 LAB — GLUCOSE, CAPILLARY: Glucose-Capillary: 110 mg/dL — ABNORMAL HIGH (ref 70–99)

## 2020-05-27 NOTE — Progress Notes (Signed)
PCP - Dr. Randol Kern Cardiologist - patient denies  PPM/ICD - n/a Device Orders -  Rep Notified -   Chest x-Deante - 04/30/2020 EKG - 04/30/2020 Stress Test - patient denies ECHO - patient denies Cardiac Cath - patient denies  Sleep Study - n/a, negative stop bang CPAP -   Fasting Blood Sugar -  Checks Blood Sugar _____ times a day  Blood Thinner Instructions:  Aspirin Instructions:  ERAS Protcol - NPO after midnight PRE-SURGERY Ensure or G2-   COVID TEST- 05/27/20, patient educated about quarantine policy   Anesthesia review: n/a  Patient denies shortness of breath, fever, cough and chest pain at PAT appointment   All instructions explained to the patient, with a verbal understanding of the material. Patient agrees to go over the instructions while at home for a better understanding. Patient also instructed to self quarantine after being tested for COVID-19. The opportunity to ask questions was provided.

## 2020-05-27 NOTE — Progress Notes (Signed)
[facility] [address], [place] - [address] [place] [id number] [place] [address] [place] [address] Phone: [phone] Fax: [phone]      Your procedure is scheduled on Monday, January 3rd, 2022.   Report to [facility] Main Entrance "A" at 5:30 A.M., and check in at the Admitting office.  Call this number if you have problems the morning of surgery:  [phone]    Remember:  Do not eat or drink after midnight the night before your surgery    Take these medicines the morning of surgery with A SIP OF WATER  Amlodipine (Norvasc) Pravastatin (Pravachol)  If needed you may take Tizanidine (Zanaflex) the morning of surgery    As of today, STOP taking any Aspirin (unless otherwise instructed by your surgeon) Aleve, Naproxen, Ibuprofen, Motrin, Advil, Goody's, BC's, all herbal medications, fish oil, and all vitamins.                      Do not wear jewelry            Do not wear lotions, powders, colognes, or deodorant.            Men may shave face and neck.            Do not bring valuables to the hospital.            [facility] is not responsible for any belongings or valuables.  Do NOT Smoke (Tobacco/Vaping) or drink Alcohol 24 hours prior to your procedure  If you use a CPAP at night, you may bring all equipment for your overnight stay.   Contacts, glasses, dentures or bridgework may not be worn into surgery.      For patients admitted to the hospital, discharge time will be determined by your treatment team.   Patients discharged the day of surgery will not be allowed to drive home, and someone needs to stay with them for 24 hours.    Special instructions:   [facility]- Preparing For Surgery  Before surgery, you can play an important role. Because skin is not sterile, your skin needs to be as free of germs as possible. You can reduce the number of germs on your skin by washing with CHG (chlorahexidine gluconate) Soap before surgery.  CHG is an antiseptic  cleaner which kills germs and bonds with the skin to continue killing germs even after washing.    Oral Hygiene is also important to reduce your risk of infection.  Remember - BRUSH YOUR TEETH THE MORNING OF SURGERY WITH YOUR REGULAR TOOTHPASTE  Please do not use if you have an allergy to CHG or antibacterial soaps. If your skin becomes reddened/irritated stop using the CHG.  Do not shave (including legs and underarms) for at least 48 hours prior to first CHG shower. It is OK to shave your face.  Please follow these instructions carefully.   1. Shower the NIGHT BEFORE SURGERY and the MORNING OF SURGERY with CHG Soap.   2. If you chose to wash your hair, wash your hair first as usual with your normal shampoo.  3. After you shampoo, rinse your hair and body thoroughly to remove the shampoo.  4. Use CHG as you would any other liquid soap. You can apply CHG directly to the skin and wash gently with a scrungie or a clean washcloth.   5. Apply the CHG Soap to your body ONLY FROM THE NECK DOWN.  Do not use on open  wounds or open sores. Avoid contact with your eyes, ears, mouth and genitals (private parts). Wash Face and genitals (private parts)  with your normal soap.   6. Wash thoroughly, paying special attention to the area where your surgery will be performed.  7. Thoroughly rinse your body with warm water from the neck down.  8. DO NOT shower/wash with your normal soap after using and rinsing off the CHG Soap.  9. Pat yourself dry with a CLEAN TOWEL.  10. Wear CLEAN PAJAMAS to bed the night before surgery  11. Place CLEAN SHEETS on your bed the night of your first shower and DO NOT SLEEP WITH PETS.   Day of Surgery: Shower with CHG soap as instructed Wear Clean/Comfortable clothing the morning of surgery Do not apply any deodorants/lotions.   Remember to brush your teeth WITH YOUR REGULAR TOOTHPASTE.   Please read over the following fact sheets that you were given.

## 2020-06-03 ENCOUNTER — Other Ambulatory Visit (HOSPITAL_COMMUNITY)
Admission: RE | Admit: 2020-06-03 | Discharge: 2020-06-03 | Disposition: A | Discharge status: Home / Self Care | Payer: Medicare Other | Source: Ambulatory Visit | Attending: Neurosurgery | Admitting: Neurosurgery

## 2020-06-03 DIAGNOSIS — Z20822 Contact with and (suspected) exposure to covid-19: Secondary | ICD-10-CM | POA: Insufficient documentation

## 2020-06-03 DIAGNOSIS — Z01818 Encounter for other preprocedural examination: Secondary | ICD-10-CM | POA: Insufficient documentation

## 2020-06-03 LAB — SARS CORONAVIRUS 2 (TAT 6-24 HRS): SARS Coronavirus 2: NEGATIVE

## 2020-06-04 ENCOUNTER — Other Ambulatory Visit (HOSPITAL_COMMUNITY): Payer: Medicare Other

## 2020-06-04 ENCOUNTER — Other Ambulatory Visit: Payer: Self-pay

## 2020-06-04 ENCOUNTER — Encounter (HOSPITAL_COMMUNITY): Payer: Self-pay | Admitting: Neurosurgery

## 2020-06-04 NOTE — Progress Notes (Signed)
Patient denies shortness of breath, fever, cough or chest pain.  PCP - Dr Laurel Dimmer Phebe Colla, NP Cardiologist - n/a  Chest x-Trashaun - 04/29/20 EKG - 04/29/20 Stress Test - n/a  ECHO - n/a Cardiac Cath - n/a  STOP now taking any Aspirin (unless otherwise instructed by your surgeon), Aleve, Naproxen, Ibuprofen, Motrin, Advil, Goody's, BC's, all herbal medications, fish oil, and all vitamins.   Coronavirus Screening Covid test on 06/03/20 was negative.  Patient verbalized understanding of instructions that were given via phone.

## 2020-06-07 ENCOUNTER — Ambulatory Visit (HOSPITAL_COMMUNITY): Payer: Medicare Other

## 2020-06-07 ENCOUNTER — Ambulatory Visit (HOSPITAL_COMMUNITY): Payer: Medicare Other | Admitting: Vascular Surgery

## 2020-06-07 ENCOUNTER — Ambulatory Visit (HOSPITAL_COMMUNITY): Payer: Medicare Other | Admitting: Anesthesiology

## 2020-06-07 ENCOUNTER — Ambulatory Visit (HOSPITAL_COMMUNITY)
Admission: RE | Admit: 2020-06-07 | Discharge: 2020-06-08 | Disposition: A | Discharge status: Home / Self Care | Payer: Medicare Other | Attending: Neurosurgery | Admitting: Neurosurgery

## 2020-06-07 ENCOUNTER — Encounter (HOSPITAL_COMMUNITY)
Admission: RE | Disposition: A | Discharge status: Home / Self Care | Payer: Self-pay | Source: Home / Self Care | Attending: Neurosurgery

## 2020-06-07 ENCOUNTER — Other Ambulatory Visit: Payer: Self-pay

## 2020-06-07 ENCOUNTER — Encounter (HOSPITAL_COMMUNITY): Payer: Self-pay | Admitting: Neurosurgery

## 2020-06-07 DIAGNOSIS — Z87891 Personal history of nicotine dependence: Secondary | ICD-10-CM | POA: Insufficient documentation

## 2020-06-07 DIAGNOSIS — M4802 Spinal stenosis, cervical region: Secondary | ICD-10-CM | POA: Insufficient documentation

## 2020-06-07 DIAGNOSIS — Z96641 Presence of right artificial hip joint: Secondary | ICD-10-CM | POA: Diagnosis not present

## 2020-06-07 DIAGNOSIS — M4322 Fusion of spine, cervical region: Secondary | ICD-10-CM | POA: Diagnosis not present

## 2020-06-07 DIAGNOSIS — G959 Disease of spinal cord, unspecified: Secondary | ICD-10-CM | POA: Diagnosis present

## 2020-06-07 DIAGNOSIS — E559 Vitamin D deficiency, unspecified: Secondary | ICD-10-CM | POA: Diagnosis not present

## 2020-06-07 DIAGNOSIS — M4712 Other spondylosis with myelopathy, cervical region: Secondary | ICD-10-CM | POA: Diagnosis not present

## 2020-06-07 DIAGNOSIS — Z981 Arthrodesis status: Secondary | ICD-10-CM | POA: Diagnosis not present

## 2020-06-07 DIAGNOSIS — Z79899 Other long term (current) drug therapy: Secondary | ICD-10-CM | POA: Diagnosis not present

## 2020-06-07 DIAGNOSIS — Z419 Encounter for procedure for purposes other than remedying health state, unspecified: Secondary | ICD-10-CM

## 2020-06-07 DIAGNOSIS — I10 Essential (primary) hypertension: Secondary | ICD-10-CM | POA: Diagnosis not present

## 2020-06-07 HISTORY — PX: ANTERIOR CERVICAL DECOMP/DISCECTOMY FUSION: SHX1161

## 2020-06-07 LAB — GLUCOSE, CAPILLARY: Glucose-Capillary: 105 mg/dL — ABNORMAL HIGH (ref 70–99)

## 2020-06-07 IMAGING — RF DG C-ARM 1-60 MIN
1 series · 2 of 2 positions shown · non-contrast
Comparison: None.

CLINICAL DATA: C3-5 ACDF.

EXAM:
CERVICAL SPINE - 2-3 VIEW; DG C-ARM 1-60 MIN

[Series 1: run · 2 of 2 slices shown]
[im 1/2]
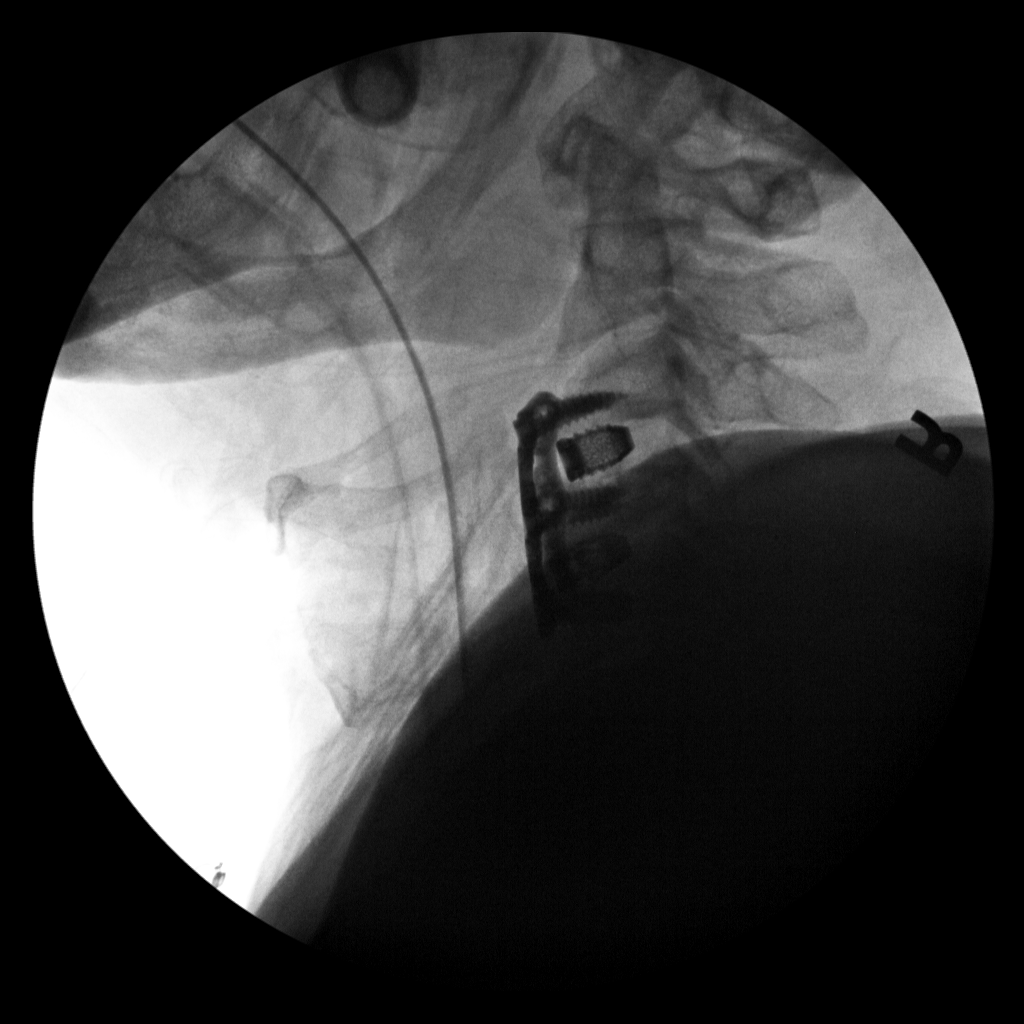
[im 2/2]
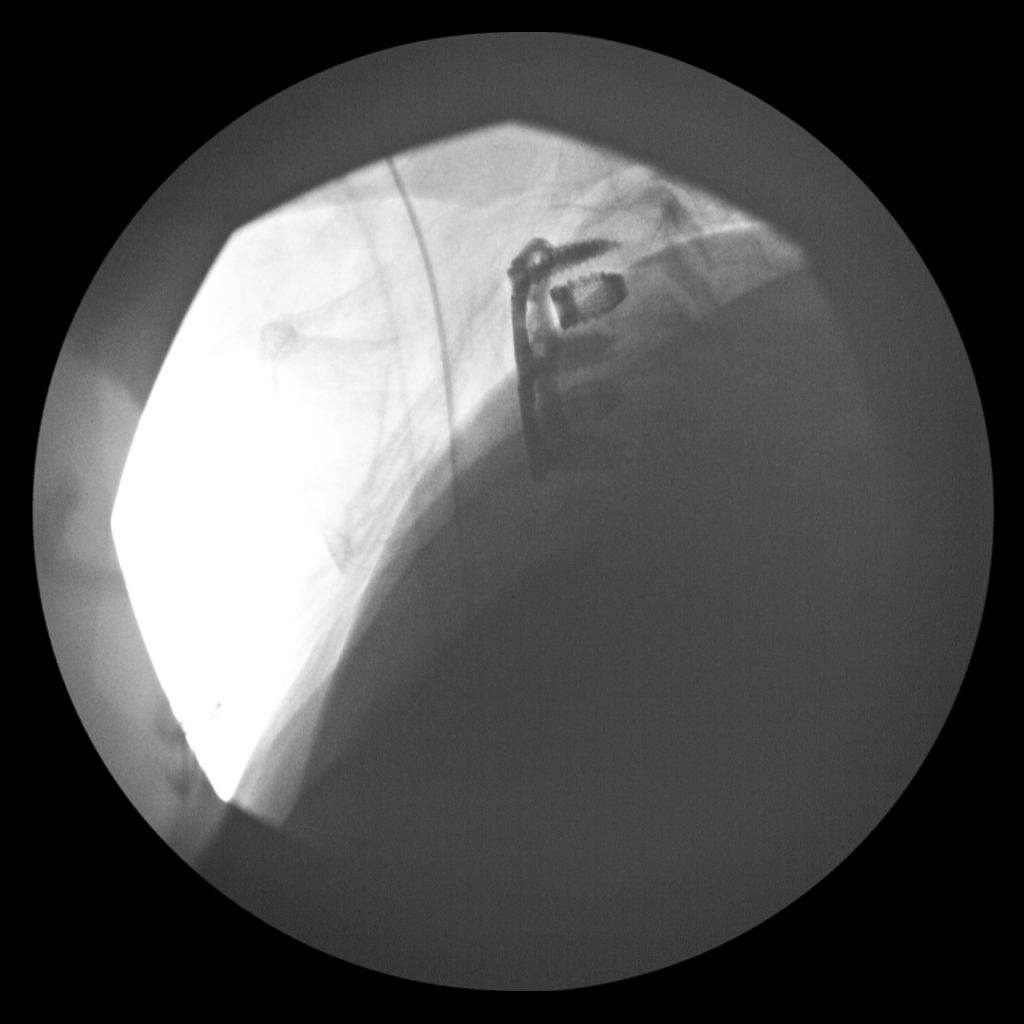

[2 of 2 positions shown; findings below may reference images not displayed]

FINDINGS: Two lateral fluoroscopic spot views of the cervical spine obtained
in the operating room. Anterior fusion C3 through C5 with interbody
spacers. Fluoroscopy time 13 seconds. Dose 1.82 mGy.
IMPRESSION: Intraoperative fluoroscopy during C3-C5 ACDF.

## 2020-06-07 IMAGING — RF DG CERVICAL SPINE 2 OR 3 VIEWS
1 series · 2 of 2 positions shown · non-contrast
Comparison: None.

CLINICAL DATA: C3-5 ACDF.

EXAM:
CERVICAL SPINE - 2-3 VIEW; DG C-ARM 1-60 MIN

[Series 1: run · 2 of 2 slices shown]
[im 1/2]
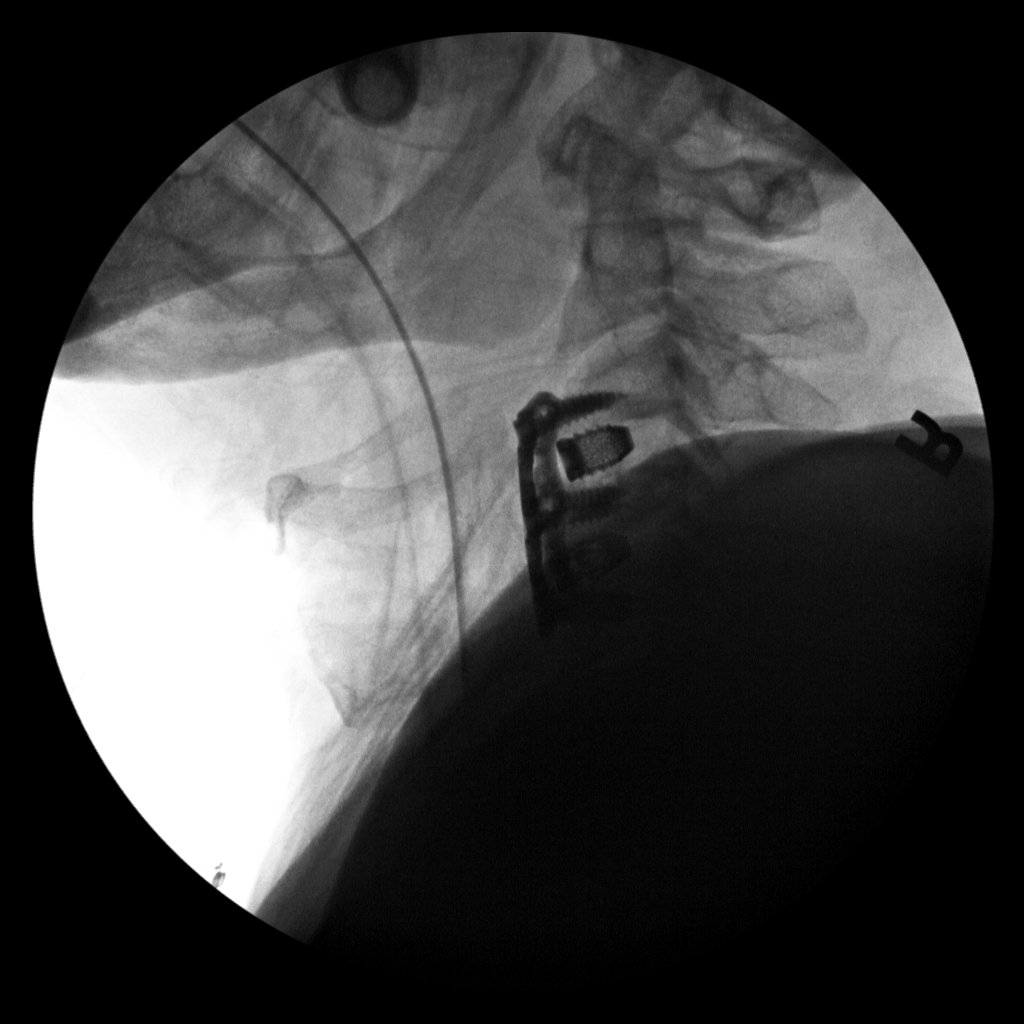
[im 2/2]
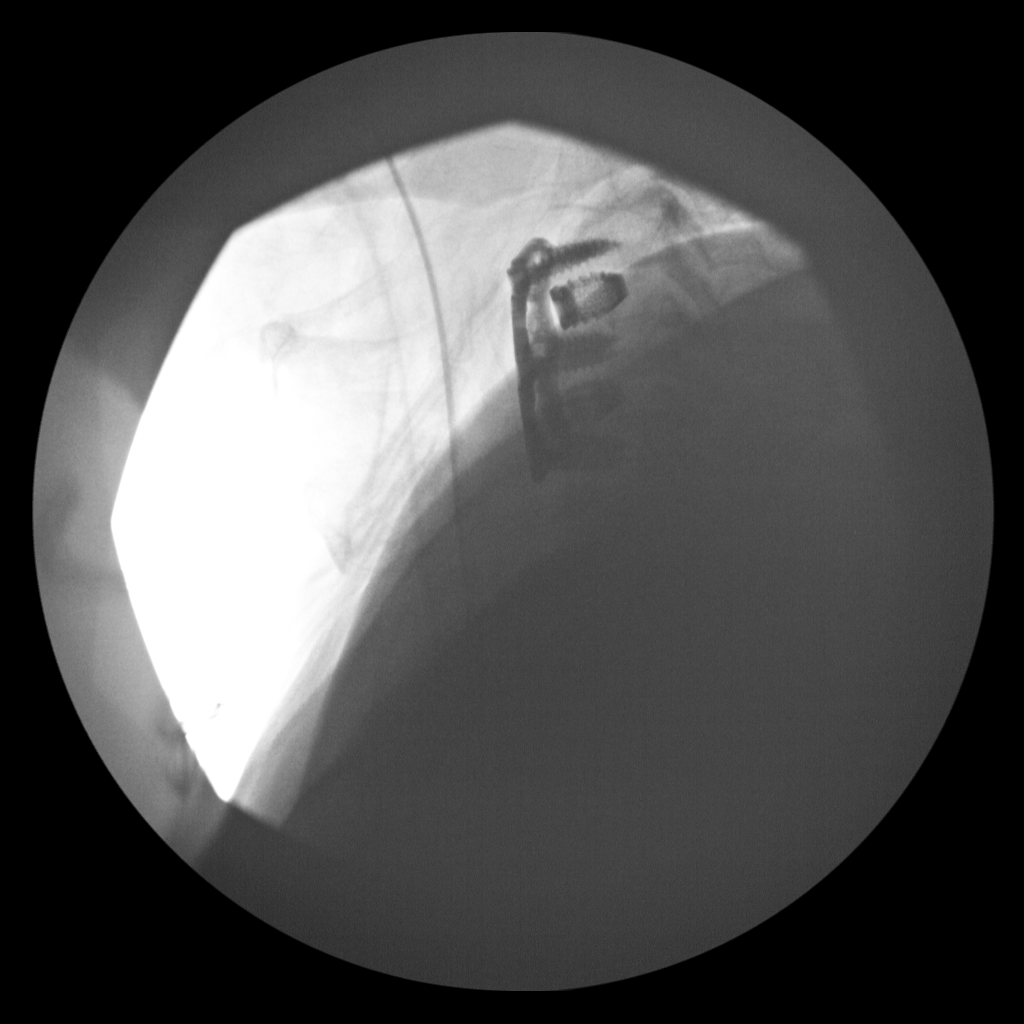

[2 of 2 positions shown; findings below may reference images not displayed]

FINDINGS: Two lateral fluoroscopic spot views of the cervical spine obtained
in the operating room. Anterior fusion C3 through C5 with interbody
spacers. Fluoroscopy time 13 seconds. Dose 1.82 mGy.
IMPRESSION: Intraoperative fluoroscopy during C3-C5 ACDF.

## 2020-06-07 SURGERY — ANTERIOR CERVICAL DECOMPRESSION/DISCECTOMY FUSION 2 LEVELS
Anesthesia: General | Site: Spine Cervical

## 2020-06-07 MED ORDER — SODIUM CHLORIDE 0.9% FLUSH: 3.0000 mL | INTRAVENOUS | Status: DC | PRN

## 2020-06-07 MED ORDER — LACTATED RINGERS IV SOLN: INTRAVENOUS | Status: DC | PRN

## 2020-06-07 MED ORDER — ROCURONIUM BROMIDE 10 MG/ML (PF) SYRINGE
PREFILLED_SYRINGE | INTRAVENOUS | Status: AC
  Filled 2020-06-07: qty 10

## 2020-06-07 MED ORDER — PANTOPRAZOLE SODIUM 40 MG IV SOLR: 40.0000 mg | Freq: Every day | INTRAVENOUS | Status: DC

## 2020-06-07 MED ORDER — PROMETHAZINE HCL 25 MG/ML IJ SOLN: 6.2500 mg | INTRAMUSCULAR | Status: DC | PRN

## 2020-06-07 MED ORDER — SODIUM CHLORIDE 0.9 % IV SOLN: 250.0000 mL | INTRAVENOUS | Status: DC

## 2020-06-07 MED ORDER — CEFAZOLIN SODIUM-DEXTROSE 2-4 GM/100ML-% IV SOLN
2.0000 g | INTRAVENOUS | Status: AC
  Administered 2020-06-07: 2 g via INTRAVENOUS
  Filled 2020-06-07: qty 100

## 2020-06-07 MED ORDER — PHENOL 1.4 % MT LIQD: 1.0000 | OROMUCOSAL | Status: DC | PRN

## 2020-06-07 MED ORDER — CELECOXIB 200 MG PO CAPS
200.0000 mg | ORAL_CAPSULE | Freq: Once | ORAL | Status: AC
  Administered 2020-06-07: 200 mg via ORAL
  Filled 2020-06-07: qty 1

## 2020-06-07 MED ORDER — FENTANYL CITRATE (PF) 100 MCG/2ML IJ SOLN
25.0000 ug | INTRAMUSCULAR | Status: DC | PRN
  Administered 2020-06-07: 25 ug via INTRAVENOUS
  Administered 2020-06-07: 50 ug via INTRAVENOUS
  Administered 2020-06-07: 25 ug via INTRAVENOUS

## 2020-06-07 MED ORDER — SUFENTANIL CITRATE 50 MCG/ML IV SOLN
INTRAVENOUS | Status: AC
  Filled 2020-06-07: qty 1

## 2020-06-07 MED ORDER — SUFENTANIL CITRATE 50 MCG/ML IV SOLN
INTRAVENOUS | Status: DC | PRN
  Administered 2020-06-07: 20 ug via INTRAVENOUS
  Administered 2020-06-07: 10 ug via INTRAVENOUS

## 2020-06-07 MED ORDER — SENNOSIDES-DOCUSATE SODIUM 8.6-50 MG PO TABS: 1.0000 | ORAL_TABLET | Freq: Two times a day (BID) | ORAL | Status: DC

## 2020-06-07 MED ORDER — CHLORHEXIDINE GLUCONATE 0.12 % MT SOLN
15.0000 mL | Freq: Once | OROMUCOSAL | Status: AC
  Administered 2020-06-07: 15 mL via OROMUCOSAL
  Filled 2020-06-07: qty 15

## 2020-06-07 MED ORDER — GLYCOPYRROLATE 0.2 MG/ML IJ SOLN
INTRAMUSCULAR | Status: DC | PRN
  Administered 2020-06-07: .2 mg via INTRAVENOUS

## 2020-06-07 MED ORDER — SUGAMMADEX SODIUM 200 MG/2ML IV SOLN
INTRAVENOUS | Status: DC | PRN
  Administered 2020-06-07: 225 mg via INTRAVENOUS

## 2020-06-07 MED ORDER — CYCLOBENZAPRINE HCL 10 MG PO TABS
10.0000 mg | ORAL_TABLET | Freq: Three times a day (TID) | ORAL | Status: DC | PRN
  Administered 2020-06-07: 10 mg via ORAL
  Filled 2020-06-07: qty 1

## 2020-06-07 MED ORDER — LIDOCAINE HCL (CARDIAC) PF 100 MG/5ML IV SOSY
PREFILLED_SYRINGE | INTRAVENOUS | Status: DC | PRN
  Administered 2020-06-07: 100 mg via INTRAVENOUS

## 2020-06-07 MED ORDER — ACETAMINOPHEN 325 MG PO TABS: 650.0000 mg | ORAL_TABLET | ORAL | Status: DC | PRN

## 2020-06-07 MED ORDER — MENTHOL 3 MG MT LOZG: 1.0000 | LOZENGE | OROMUCOSAL | Status: DC | PRN

## 2020-06-07 MED ORDER — ALUM & MAG HYDROXIDE-SIMETH 200-200-20 MG/5ML PO SUSP: 30.0000 mL | Freq: Four times a day (QID) | ORAL | Status: DC | PRN

## 2020-06-07 MED ORDER — ONDANSETRON HCL 4 MG/2ML IJ SOLN
INTRAMUSCULAR | Status: AC
  Filled 2020-06-07: qty 2

## 2020-06-07 MED ORDER — PANTOPRAZOLE SODIUM 40 MG PO TBEC
40.0000 mg | DELAYED_RELEASE_TABLET | Freq: Every day | ORAL | Status: DC
  Administered 2020-06-07: 40 mg via ORAL
  Filled 2020-06-07: qty 1

## 2020-06-07 MED ORDER — HYDROMORPHONE HCL 1 MG/ML IJ SOLN: 0.5000 mg | INTRAMUSCULAR | Status: DC | PRN

## 2020-06-07 MED ORDER — CEFAZOLIN SODIUM-DEXTROSE 2-4 GM/100ML-% IV SOLN
2.0000 g | Freq: Three times a day (TID) | INTRAVENOUS | Status: AC
  Administered 2020-06-07 – 2020-06-08 (×2): 2 g via INTRAVENOUS
  Filled 2020-06-07 (×2): qty 100

## 2020-06-07 MED ORDER — THROMBIN 5000 UNITS EX SOLR
CUTANEOUS | Status: AC
  Filled 2020-06-07: qty 5000

## 2020-06-07 MED ORDER — ORAL CARE MOUTH RINSE: 15.0000 mL | Freq: Once | OROMUCOSAL | Status: AC

## 2020-06-07 MED ORDER — THROMBIN 5000 UNITS EX SOLR
CUTANEOUS | Status: AC
  Filled 2020-06-07: qty 10000

## 2020-06-07 MED ORDER — DEXAMETHASONE SODIUM PHOSPHATE 10 MG/ML IJ SOLN
10.0000 mg | Freq: Once | INTRAMUSCULAR | Status: AC
  Administered 2020-06-07: 10 mg via INTRAVENOUS
  Filled 2020-06-07: qty 1

## 2020-06-07 MED ORDER — CHLORHEXIDINE GLUCONATE CLOTH 2 % EX PADS: 6.0000 | MEDICATED_PAD | Freq: Once | CUTANEOUS | Status: DC

## 2020-06-07 MED ORDER — ONDANSETRON HCL 4 MG PO TABS: 4.0000 mg | ORAL_TABLET | Freq: Four times a day (QID) | ORAL | Status: DC | PRN

## 2020-06-07 MED ORDER — PRAVASTATIN SODIUM 10 MG PO TABS
20.0000 mg | ORAL_TABLET | Freq: Every evening | ORAL | Status: DC
  Administered 2020-06-07: 20 mg via ORAL
  Filled 2020-06-07: qty 2

## 2020-06-07 MED ORDER — TIZANIDINE HCL 4 MG PO TABS: 4.0000 mg | ORAL_TABLET | Freq: Four times a day (QID) | ORAL | Status: DC | PRN

## 2020-06-07 MED ORDER — ALBUMIN HUMAN 5 % IV SOLN: INTRAVENOUS | Status: DC | PRN

## 2020-06-07 MED ORDER — LACTATED RINGERS IV SOLN: INTRAVENOUS | Status: DC

## 2020-06-07 MED ORDER — OXYCODONE HCL 5 MG PO TABS
10.0000 mg | ORAL_TABLET | ORAL | Status: DC | PRN
  Administered 2020-06-07 – 2020-06-08 (×2): 10 mg via ORAL
  Filled 2020-06-07 (×2): qty 2

## 2020-06-07 MED ORDER — THROMBIN 5000 UNITS EX SOLR
OROMUCOSAL | Status: DC | PRN
  Administered 2020-06-07: 5 mL via TOPICAL

## 2020-06-07 MED ORDER — DEXAMETHASONE SODIUM PHOSPHATE 10 MG/ML IJ SOLN
INTRAMUSCULAR | Status: AC
  Filled 2020-06-07: qty 1

## 2020-06-07 MED ORDER — MIDAZOLAM HCL 2 MG/2ML IJ SOLN
INTRAMUSCULAR | Status: AC
  Filled 2020-06-07: qty 2

## 2020-06-07 MED ORDER — SODIUM CHLORIDE 0.9% FLUSH
3.0000 mL | Freq: Two times a day (BID) | INTRAVENOUS | Status: DC
  Administered 2020-06-07: 3 mL via INTRAVENOUS

## 2020-06-07 MED ORDER — ONDANSETRON HCL 4 MG/2ML IJ SOLN
INTRAMUSCULAR | Status: DC | PRN
  Administered 2020-06-07: 4 mg via INTRAVENOUS

## 2020-06-07 MED ORDER — FENTANYL CITRATE (PF) 100 MCG/2ML IJ SOLN
INTRAMUSCULAR | Status: AC
  Filled 2020-06-07: qty 2

## 2020-06-07 MED ORDER — ACETAMINOPHEN 500 MG PO TABS
1000.0000 mg | ORAL_TABLET | Freq: Once | ORAL | Status: AC
  Administered 2020-06-07: 1000 mg via ORAL
  Filled 2020-06-07: qty 2

## 2020-06-07 MED ORDER — GLYCOPYRROLATE PF 0.2 MG/ML IJ SOSY
PREFILLED_SYRINGE | INTRAMUSCULAR | Status: AC
  Filled 2020-06-07: qty 1

## 2020-06-07 MED ORDER — PROPOFOL 10 MG/ML IV BOLUS
INTRAVENOUS | Status: AC
  Filled 2020-06-07: qty 20

## 2020-06-07 MED ORDER — SODIUM CHLORIDE (PF) 0.9 % IJ SOLN
INTRAMUSCULAR | Status: AC
  Filled 2020-06-07: qty 10

## 2020-06-07 MED ORDER — MIDAZOLAM HCL 2 MG/2ML IJ SOLN
INTRAMUSCULAR | Status: DC | PRN
  Administered 2020-06-07: 2 mg via INTRAVENOUS

## 2020-06-07 MED ORDER — ONDANSETRON HCL 4 MG/2ML IJ SOLN: 4.0000 mg | Freq: Four times a day (QID) | INTRAMUSCULAR | Status: DC | PRN

## 2020-06-07 MED ORDER — LIDOCAINE 2% (20 MG/ML) 5 ML SYRINGE
INTRAMUSCULAR | Status: AC
  Filled 2020-06-07: qty 5

## 2020-06-07 MED ORDER — AMLODIPINE BESYLATE 5 MG PO TABS
5.0000 mg | ORAL_TABLET | Freq: Every day | ORAL | Status: DC
Start: 2020-06-07 — End: 2020-06-08
  Administered 2020-06-07: 5 mg via ORAL
  Filled 2020-06-07: qty 1

## 2020-06-07 MED ORDER — ROCURONIUM 10MG/ML (10ML) SYRINGE FOR MEDFUSION PUMP - OPTIME
INTRAVENOUS | Status: DC | PRN
  Administered 2020-06-07: 100 mg via INTRAVENOUS

## 2020-06-07 MED ORDER — HEMOSTATIC AGENTS (NO CHARGE) OPTIME
TOPICAL | Status: DC | PRN
  Administered 2020-06-07: 1 via TOPICAL

## 2020-06-07 MED ORDER — 0.9 % SODIUM CHLORIDE (POUR BTL) OPTIME
TOPICAL | Status: DC | PRN
  Administered 2020-06-07 (×2): 1000 mL

## 2020-06-07 MED ORDER — PROPOFOL 10 MG/ML IV BOLUS
INTRAVENOUS | Status: DC | PRN
  Administered 2020-06-07: 160 mg via INTRAVENOUS

## 2020-06-07 MED ORDER — THROMBIN 5000 UNITS EX SOLR
CUTANEOUS | Status: DC | PRN
  Administered 2020-06-07 (×2): 5000 [IU] via TOPICAL

## 2020-06-07 MED ORDER — ACETAMINOPHEN 650 MG RE SUPP: 650.0000 mg | RECTAL | Status: DC | PRN

## 2020-06-07 MED ORDER — PHENYLEPHRINE HCL-NACL 10-0.9 MG/250ML-% IV SOLN
INTRAVENOUS | Status: DC | PRN
  Administered 2020-06-07: 10 ug/min via INTRAVENOUS

## 2020-06-07 SURGICAL SUPPLY — 66 items
ADH SKN CLS APL DERMABOND .7 (GAUZE/BANDAGES/DRESSINGS) ×1
APL SKNCLS STERI-STRIP NONHPOA (GAUZE/BANDAGES/DRESSINGS) ×1
BAND INSRT 18 STRL LF DISP RB (MISCELLANEOUS) ×2
BAND RUBBER #18 3X1/16 STRL (MISCELLANEOUS) ×4 IMPLANT
BASKET BONE COLLECTION (BASKET) ×2 IMPLANT
BENZOIN TINCTURE PRP APPL 2/3 (GAUZE/BANDAGES/DRESSINGS) ×2 IMPLANT
BIT DRILL NEURO 2X3.1 SFT TUCH (MISCELLANEOUS) ×1 IMPLANT
BONE VIVIGEN FORMABLE 1.3CC (Bone Implant) ×2 IMPLANT
BUR MATCHSTICK NEURO 3.0 LAGG (BURR) ×2 IMPLANT
CANISTER SUCT 3000ML PPV (MISCELLANEOUS) ×2 IMPLANT
CARTRIDGE OIL MAESTRO DRILL (MISCELLANEOUS) ×1 IMPLANT
COVER WAND RF STERILE (DRAPES) ×1 IMPLANT
DERMABOND ADVANCED (GAUZE/BANDAGES/DRESSINGS) ×1
DERMABOND ADVANCED .7 DNX12 (GAUZE/BANDAGES/DRESSINGS) IMPLANT
DIFFUSER DRILL AIR PNEUMATIC (MISCELLANEOUS) ×2 IMPLANT
DRAPE C-ARM 42X72 X-RAY (DRAPES) ×4 IMPLANT
DRAPE LAPAROTOMY 100X72 PEDS (DRAPES) ×2 IMPLANT
DRAPE MICROSCOPE LEICA (MISCELLANEOUS) ×2 IMPLANT
DRILL NEURO 2X3.1 SOFT TOUCH (MISCELLANEOUS) ×2
DRSG OPSITE POSTOP 4X6 (GAUZE/BANDAGES/DRESSINGS) ×1 IMPLANT
DURAPREP 6ML APPLICATOR 50/CS (WOUND CARE) ×2 IMPLANT
ELECT COATED BLADE 2.86 ST (ELECTRODE) ×2 IMPLANT
ELECT REM PT RETURN 9FT ADLT (ELECTROSURGICAL) ×2
ELECTRODE REM PT RTRN 9FT ADLT (ELECTROSURGICAL) ×1 IMPLANT
GAUZE 4X4 16PLY RFD (DISPOSABLE) IMPLANT
GAUZE SPONGE 4X4 12PLY STRL (GAUZE/BANDAGES/DRESSINGS) ×1 IMPLANT
GLOVE BIO SURGEON STRL SZ7 (GLOVE) ×1 IMPLANT
GLOVE BIO SURGEON STRL SZ8 (GLOVE) ×2 IMPLANT
GLOVE BIOGEL PI IND STRL 7.0 (GLOVE) IMPLANT
GLOVE BIOGEL PI INDICATOR 7.0 (GLOVE) ×1
GLOVE EXAM NITRILE XL STR (GLOVE) IMPLANT
GLOVE INDICATOR 8.5 STRL (GLOVE) ×2 IMPLANT
GLOVE SURG SS PI 6.0 STRL IVOR (GLOVE) ×6 IMPLANT
GLOVE SURG UNDER POLY LF SZ6.5 (GLOVE) ×1 IMPLANT
GLOVE SURG UNDER POLY LF SZ7 (GLOVE) ×1 IMPLANT
GOWN STRL REUS W/ TWL LRG LVL3 (GOWN DISPOSABLE) IMPLANT
GOWN STRL REUS W/ TWL XL LVL3 (GOWN DISPOSABLE) ×1 IMPLANT
GOWN STRL REUS W/TWL 2XL LVL3 (GOWN DISPOSABLE) ×1 IMPLANT
GOWN STRL REUS W/TWL LRG LVL3 (GOWN DISPOSABLE) ×4
GOWN STRL REUS W/TWL XL LVL3 (GOWN DISPOSABLE) ×2
GRAFT BNE MATRIX VG FRMBL SM 1 (Bone Implant) IMPLANT
HALTER HD/CHIN CERV TRACTION D (MISCELLANEOUS) ×2 IMPLANT
HEMOSTAT POWDER KIT SURGIFOAM (HEMOSTASIS) ×2 IMPLANT
KIT BASIN OR (CUSTOM PROCEDURE TRAY) ×2 IMPLANT
KIT TURNOVER KIT B (KITS) ×2 IMPLANT
NDL SPNL 20GX3.5 QUINCKE YW (NEEDLE) ×1 IMPLANT
NEEDLE SPNL 20GX3.5 QUINCKE YW (NEEDLE) ×4 IMPLANT
NS IRRIG 1000ML POUR BTL (IV SOLUTION) ×2 IMPLANT
OIL CARTRIDGE MAESTRO DRILL (MISCELLANEOUS) ×2
PACK LAMINECTOMY NEURO (CUSTOM PROCEDURE TRAY) ×2 IMPLANT
PAD ARMBOARD 7.5X6 YLW CONV (MISCELLANEOUS) ×6 IMPLANT
PIN DISTRACTION 14MM (PIN) ×2 IMPLANT
PLATE ANT CERV XTEND 2 LV 32 (Plate) ×1 IMPLANT
SCREW VAR 4.2 XD SELF DRILL 16 (Screw) ×5 IMPLANT
SCREW XTEND SELF DRILL 4.6X16 (Screw) ×1 IMPLANT
SPACER HEDRON C 12X14X6 0D (Spacer) ×1 IMPLANT
SPACER HEDRON C 12X14X7 0D (Spacer) ×1 IMPLANT
SPONGE INTESTINAL PEANUT (DISPOSABLE) ×3 IMPLANT
SPONGE SURGIFOAM ABS GEL SZ50 (HEMOSTASIS) ×2 IMPLANT
STRIP CLOSURE SKIN 1/2X4 (GAUZE/BANDAGES/DRESSINGS) ×2 IMPLANT
SUT VIC AB 3-0 SH 8-18 (SUTURE) ×2 IMPLANT
SUT VICRYL 4-0 PS2 18IN ABS (SUTURE) ×2 IMPLANT
TAPE CLOTH 4X10 WHT NS (GAUZE/BANDAGES/DRESSINGS) IMPLANT
TOWEL GREEN STERILE (TOWEL DISPOSABLE) ×2 IMPLANT
TOWEL GREEN STERILE FF (TOWEL DISPOSABLE) ×2 IMPLANT
WATER STERILE IRR 1000ML POUR (IV SOLUTION) ×2 IMPLANT

## 2020-06-07 NOTE — Anesthesia Preprocedure Evaluation (Addendum)
Anesthesia Evaluation  Patient identified by MRN, date of birth, ID band Patient awake    Reviewed: Allergy & Precautions, NPO status , Patient's Chart, lab work & pertinent test results  History of Anesthesia Complications Negative for: history of anesthetic complications  Airway Mallampati: I  TM Distance: >3 FB Neck ROM: Full    Dental  (+) Edentulous Upper, Edentulous Lower, Dental Advisory Given   Pulmonary neg pulmonary ROS, former smoker,    Pulmonary exam normal        Cardiovascular hypertension, Pt. on medications Normal cardiovascular exam     Neuro/Psych    GI/Hepatic Neg liver ROS, GERD  ,  Endo/Other  negative endocrine ROS  Renal/GU negative Renal ROS     Musculoskeletal negative musculoskeletal ROS (+)   Abdominal   Peds  Hematology negative hematology ROS (+)   Anesthesia Other Findings   Reproductive/Obstetrics                            Anesthesia Physical Anesthesia Plan  ASA: II  Anesthesia Plan: General   Post-op Pain Management:    Induction: Intravenous  PONV Risk Score and Plan: 3 and Ondansetron, Dexamethasone and Midazolam  Airway Management Planned: Oral ETT  Additional Equipment:   Intra-op Plan:   Post-operative Plan: Extubation in OR  Informed Consent: I have reviewed the patients History and Physical, chart, labs and discussed the procedure including the risks, benefits and alternatives for the proposed anesthesia with the patient or authorized representative who has indicated his/her understanding and acceptance.     Dental advisory given  Plan Discussed with: Anesthesiologist and CRNA  Anesthesia Plan Comments:        Anesthesia Quick Evaluation

## 2020-06-07 NOTE — Op Note (Signed)
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  Work-up revealed severe stenosis at C3-4 and C4-5 with myelomalacia and signal change in his cord.  Due to patient's progression of clinical syndrome imaging findings and failed conservative treatment I recommended anterior cervical discectomies and fusion at those 2 levels.  I extensively reviewed the risks and benefits of the operation with him as well as perioperative course expectations of outcome and alternatives of surgery and he understood and agreed to proceed forward.  Operative procedure: Patient was brought into the OR was [facility] general anesthesia positioned supine the neck in slight extension 5 pounds halter traction the right side was next prepped and draped in routine sterile fashion.  Preoperative x-Jordani localized the appropriate level so a curvilinear incision was made just off the midline to the anterior border of the sternocleidomastoid and the superficial hypertension was dissected out divided longitudinally.  The avascular plane was developed down to the previous fashion.  Fascia dissected Kit[place].  Intraoperative x-Markeem confirmed identification of the C4-5 disc base level so the lungs go deflected laterally and self-retaining retractor was placed.  Large anterior osteophytes were bitten off and removed with a Leksell rongeur from both the C3-4 and C4-5 disc bases the spaces were then  scraped and drilled out with a high-speed drill capturing the bone shavings and mucus trap.  Then under microscopic illumination first working at C3-4 with distractor pins in place disc base was cleaned out I removed extensive mount of spur and herniated disc causing severe cord compression aggressively under biting both endplates decompressing central canal.  Marching laterally identified both C4 pedicles internal and decompress both C4 nerve roots flush with pedicle.  Then after adequate decompression been achieved centrally and foraminally.  The endplates and inserted a 6mm titanium cage had been packed with locally harvested autograft mixed with vivigen.  Then tension taken 1 disc base below this at C4-5 in a similar fashion aggressive under biting both endplates decompressed central canal both C5 pedicles or event of identified both C5 nerve roots were decompressed and skeletonized flush with pedicle at this level I placed a 6 mm cage and then placed a 32 mm plate after biting of the anterior osteophytes and contouring the plate.  60mm screws were used to anchor and placed all screws in excellent purchase locking mechanisms were gauged then the wound was copiously irrigated meticulous hemostasis was maintained and the wounds closed in layers with interrupted Vicryl in a running 4 subcuticular Dermabond benzoin Steri-Strips and sterile dressing was applied patient recovery in stable condition.  At the end the case all needle counts and sponge counts were correct.

## 2020-06-07 NOTE — Anesthesia Procedure Notes (Signed)
Procedure Name: Intubation Date/Time: 06/07/2020 2:21 PM Performed by: Claris Che, CRNA Pre-anesthesia Checklist: Patient identified, Emergency Drugs available, Suction available, Patient being monitored and Timeout performed Patient Re-evaluated:Patient Re-evaluated prior to induction Oxygen Delivery Method: Circle system utilized Preoxygenation: Pre-oxygenation with 100% oxygen Induction Type: IV induction and Cricoid Pressure applied Ventilation: Mask ventilation without difficulty Laryngoscope Size: Mac and 4 Grade View: Grade II Tube type: Oral Tube size: 7.5 mm Number of attempts: 1 Airway Equipment and Method: Stylet Placement Confirmation: ETT inserted through vocal cords under direct vision,  positive ETCO2 and breath sounds checked- equal and bilateral Secured at: 23 cm Tube secured with: Tape Dental Injury: Teeth and Oropharynx as per pre-operative assessment

## 2020-06-07 NOTE — Progress Notes (Signed)
Orthopedic Tech Progress Note Patient Details:  Kevin Villa 09/29/1955 [id number] PACU RN called requesting a SOFT COLLAR Ortho Devices Type of Ortho Device: Soft collar Ortho Device/Splint Location: NECK Ortho Device/Splint Interventions: Ordered,Adjustment   Post Interventions Patient Tolerated: Well Instructions Provided: Care of device   [name] 06/07/2020, 5:45 PM

## 2020-06-07 NOTE — Anesthesia Postprocedure Evaluation (Signed)
Anesthesia Post Note  Patient: Marquon A Meyn  Procedure(s) Performed: ANTERIOR CERVICAL DECOMPRESSION AND FUSION CERVICAL THREE-FOUR, CERVICAL FOUR-FIVE. (N/A Spine Cervical)     Patient location during evaluation: PACU Anesthesia Type: General Level of consciousness: sedated Pain management: pain level controlled Vital Signs Assessment: post-procedure vital signs reviewed and stable Respiratory status: spontaneous breathing and respiratory function stable Cardiovascular status: stable Postop Assessment: no apparent nausea or vomiting Anesthetic complications: no   No complications documented.  Last Vitals:  Vitals:   06/07/20 1820 06/07/20 1850  BP: (!) 146/87 (!) 151/79  Pulse: 74 69  Resp: 13 14  Temp: (!) 36.2 C   SpO2: 93% 93%    Last Pain:  Vitals:   06/07/20 1850  TempSrc:   PainSc: Asleep                 Asa Baudoin DANIEL

## 2020-06-07 NOTE — Transfer of Care (Addendum)
Immediate Anesthesia Transfer of Care Note  Patient: Kevin Villa  Procedure(s) Performed: ANTERIOR CERVICAL DECOMPRESSION AND FUSION CERVICAL THREE-FOUR, CERVICAL FOUR-FIVE. (N/A Spine Cervical)  Patient Location: PACU  Anesthesia Type:General  Level of Consciousness: awake, alert , drowsy and patient cooperative  Airway & Oxygen Therapy: Patient Spontanous Breathing and Patient connected to nasal cannula oxygen  Post-op Assessment: Report given to RN and Post -op Vital signs reviewed and stable  Post vital signs: Reviewed and stable  Last Vitals:  Vitals Value Taken Time  BP 141/81 06/07/20 1724  Temp    Pulse 83 06/07/20 1727  Resp 14 06/07/20 1727  SpO2 100 % 06/07/20 1727  Vitals shown include unvalidated device data.  Last Pain:  Vitals:   06/07/20 1103  TempSrc:   PainSc: 0-No pain         Complications: No complications documented.

## 2020-06-07 NOTE — H&P (Signed)
Kevin Villa is an 65 y.o. male.   Chief Complaint: Neck pain numbness tingling weakness in his hands HPI: 65 year old gentleman with numbness tingling weakness in his hands and occasional gait difficulty and exam consistent with myelopathy and work-up showed severe cord compression with signal change within his cord behind C3-4.  With severe severe spinal stenosis at C3-4 and C4-5 due to patient's progression of clinical syndrome imaging findings and failed conservative treatment I recommended anterior cervical discectomies and fusion at C3-4 and C4-5.  I have extensively gone over the risks and benefits of that operation with him as well as perioperative course expectations of outcome and alternatives of surgery and he understands and agrees to proceed forward.  Past Medical History:  Diagnosis Date  . Arthritis    osteoarthritis   . Asthma    childhood, no inhaler, no problems as an adult  . Cervical spinal stenosis   . GERD (gastroesophageal reflux disease)    occ   . Headache    occasional  . Hematuria   . Hyperlipidemia   . Hypertension   . Pneumonia    x 1 years ago   . Pre-diabetes    no meds    Past Surgical History:  Procedure Laterality Date  . HERNIA REPAIR    . right hand surgery     pinky finger   . TOTAL HIP ARTHROPLASTY Right 11/06/2017   Procedure: RIGHT TOTAL HIP ARTHROPLASTY ANTERIOR APPROACH;  Surgeon: Durene Romans, MD;  Location: [facility] ORS;  Service: Orthopedics;  Laterality: Right;  70 mins    Family History  Problem Relation Age of Onset  . Diabetes Mother   . Hypertension Mother   . Hypertension Father   . Cancer Brother        Unsure of type  . Hypertension Sister   . Diabetes Sister   . Hypertension Sister   . Hypertension Sister   . Hypertension Sister    Social History:  reports that he quit smoking about 18 years ago. His smoking use included cigarettes. He has a 7.50 pack-year smoking history. He has never used smokeless tobacco. He  reports previous alcohol use. He reports that he does not use drugs.  Allergies:  Allergies  Allergen Reactions  . Peanut-Containing Drug Products Other (See Comments)    And also in food. Mouth raw  . Tomato Hives and Itching    Medications Prior to Admission  Medication Sig Dispense Refill  . amLODipine (NORVASC) 5 MG tablet Take 1 tablet (5 mg total) by mouth daily. 90 tablet 1  . naproxen (NAPROSYN) 500 MG tablet Take 1 tablet (500 mg total) by mouth every 12 (twelve) hours as needed. 60 tablet 2  . pravastatin (PRAVACHOL) 20 MG tablet Take 1 tablet (20 mg total) by mouth every evening. 90 tablet 1  . senna-docusate (SENOKOT-S) 8.6-50 MG tablet Take 1 tablet by mouth 2 (two) times daily. 60 tablet 3  . tiZANidine (ZANAFLEX) 4 MG tablet Take 1 tablet (4 mg total) by mouth every 6 (six) hours as needed for muscle spasms. 30 tablet 6    Results for orders placed or performed during the hospital encounter of 06/07/20 (from the past 48 hour(s))  Glucose, capillary     Status: Abnormal   Collection Time: 06/07/20 11:22 AM  Result Value Ref Range   Glucose-Capillary 105 (H) 70 - 99 mg/dL    Comment: Glucose reference range applies only to samples taken after fasting for at least 8 hours.  No results found.  Review of Systems  Musculoskeletal: Positive for back pain.  Neurological: Positive for weakness and numbness.    Blood pressure (!) 150/93, pulse 80, temperature 99.4 F (37.4 C), temperature source Oral, resp. rate 18, height 5\' 10"  (1.778 m), weight 108.9 kg, SpO2 100 %. Physical Exam HENT:     Head: Normocephalic.     Right Ear: Tympanic membrane normal.     Mouth/Throat:     Mouth: Mucous membranes are moist.  Eyes:     Pupils: Pupils are equal, round, and reactive to light.  Cardiovascular:     Pulses: Normal pulses.  Pulmonary:     Effort: Pulmonary effort is normal.  Abdominal:     General: Abdomen is flat.  Musculoskeletal:        General: Normal range of  motion.  Skin:    General: Skin is warm.  Neurological:     General: No focal deficit present.     Mental Status: He is alert.     Comments: Strength is 5 out of 5 deltoid, bicep, triceps bilaterally are slightly weak at 4+ to 5 and hand intrinsics are slightly weak at 4+ out of 5 otherwise remainder of his upper and lower EXTR extremity strength is 5 out of 5      Assessment/Plan 65 year old presents for ACDF C3-4 C4-5.  68, MD 06/07/2020, 1:23 PM

## 2020-06-08 ENCOUNTER — Encounter (HOSPITAL_COMMUNITY): Payer: Self-pay | Admitting: Neurosurgery

## 2020-06-08 DIAGNOSIS — Z87891 Personal history of nicotine dependence: Secondary | ICD-10-CM | POA: Diagnosis not present

## 2020-06-08 DIAGNOSIS — M4322 Fusion of spine, cervical region: Secondary | ICD-10-CM | POA: Diagnosis not present

## 2020-06-08 DIAGNOSIS — M4802 Spinal stenosis, cervical region: Secondary | ICD-10-CM | POA: Diagnosis not present

## 2020-06-08 DIAGNOSIS — Z96641 Presence of right artificial hip joint: Secondary | ICD-10-CM | POA: Diagnosis not present

## 2020-06-08 DIAGNOSIS — Z79899 Other long term (current) drug therapy: Secondary | ICD-10-CM | POA: Diagnosis not present

## 2020-06-08 DIAGNOSIS — M4712 Other spondylosis with myelopathy, cervical region: Secondary | ICD-10-CM | POA: Diagnosis not present

## 2020-06-08 MED ORDER — METHOCARBAMOL 500 MG PO TABS: 500.0000 mg | ORAL_TABLET | Freq: Four times a day (QID) | ORAL | 0 refills | Status: DC

## 2020-06-08 MED ORDER — OXYCODONE-ACETAMINOPHEN 5-325 MG PO TABS: 1.0000 | ORAL_TABLET | ORAL | 0 refills | Status: DC | PRN

## 2020-06-08 NOTE — Discharge Instructions (Signed)
Wound Care Keep incision covered and dry for one week.  If you shower prior to then, cover incision with plastic wrap.  You may remove outer bandage after one week and shower.  Do not put any creams, lotions, or ointments on incision. Leave steri-strips on neck.  They will fall off by themselves. Activity Walk each and every day, increasing distance each day. No lifting greater than 5 lbs.  Avoid excessive neck motion. No driving for 2 weeks; may ride as a passenger locally. Wear neck brace at all times except when showering or otherwise instructed. Diet Resume your normal diet.  Return to Work Will be discussed at you follow up appointment. Call Your Doctor If Any of These Occur Redness, drainage, or swelling at the wound.  Temperature greater than 101 degrees. Severe pain not relieved by pain medication. Increased difficulty swallowing.  Incision starts to come apart. Follow Up Appt Call today for appointment in 1-2 weeks ([phone]) or for problems.  If you have any hardware placed in your spine, you will need an x-Aharon before your appointment.   Anterior Cervical Diskectomy and Fusion, Care After This sheet gives you information about how to care for yourself after your procedure. Your health care provider may also give you more specific instructions. If you have problems or questions, contact your health care provider. What can I expect after the procedure? After the procedure, it is common to have:  Neck pain.  Discomfort when swallowing.  Slight hoarseness. Follow these instructions at home: Medicines  Take over-the-counter and prescription medicines only as told by your health care provider.  If you were prescribed an antibiotic medicine, take it or use it as told by your health care provider. Do not stop using the antibiotic even if you start to feel better.  Ask your health care provider if the medicine prescribed to you: ? Requires you to avoid driving or using  heavy machinery. ? Can cause constipation. You may need to take actions to prevent or treat constipation, such as:  Drink enough fluid to keep your urine pale yellow.  Take over-the-counter or prescription medicines.  Eat foods that are high in fiber, such as beans, whole grains, and fresh fruits and vegetables.  Limit foods that are high in fat and processed sugars, such as fried and sweet foods.  If you are taking blood thinners: ? Talk with your health care provider before you take any medicines that contain aspirin or NSAIDs, such as ibuprofen. These medicines increase your risk for dangerous bleeding. ? Take your medicine exactly as told, at the same time every day. ? Avoid activities that could cause injury or bruising, and follow instructions about how to prevent falls. ? Wear a medical alert bracelet or carry a card that lists what medicines you take. If you have a neck brace:  Wear the brace as told by your health care provider. Remove it only as told by your health care provider.  Loosen the brace if your arms/fingers tingle, become numb, or turn cold and blue.  Keep the brace clean.  If the brace is not waterproof: ? Do not let it get wet. ? Cover it with a watertight covering when you take a bath or shower. Incision care  Follow instructions from your health care provider about how to take care of your incision. Make sure you: ? Wash your hands with soap and water for at least 20 seconds before and after you change your bandage (dressing). If soap and  water are not available, use hand sanitizer. ? Change your dressing as told by your health care provider. ? Leave stitches (sutures), skin glue, or adhesive strips in place. These skin closures may need to stay in place for 2 weeks or longer. If adhesive strip edges start to loosen and curl up, you may trim the loose edges. Do not remove adhesive strips completely unless your health care provider tells you to do  that.  Check your incision area every day for signs of infection. Check for: ? Redness, swelling, or more pain. ? Fluid or blood. ? Warmth. ? Pus or a bad smell.   Managing pain, stiffness, and swelling If directed, put ice on the injured area.  If you have a removable brace, remove it as told by your health care provider.  Put ice in a plastic bag.  Place a towel between your skin and the bag.  Leave the ice on for 20 minutes, 2-3 times a day.  Remove the ice if your skin turns bright red. This is very important. If you cannot feel pain, heat, or cold, you have a greater risk of damage to the area.   Activity  Rest as told by your health care provider.  Avoid sitting for a long time without moving. Get up to take short walks every 1-2 hours. This is important to improve blood flow and breathing. Ask for help if you feel weak or unsteady.  Do not lift anything that is heavier than 10 lb (4.5 kg), or the limit that you are told, until your health care provider says that it is safe.  Do exercises as told by your health care provider.  Do not take baths, swim, or use a hot tub until your health care provider approves. Ask your health care provider if you may take showers. You may only be allowed to take sponge baths.  Return to your normal activities as told by your health care provider. Ask your health care provider what activities are safe for you.   General instructions  Do not use any products that contain nicotine or tobacco, such as cigarettes, e-cigarettes, and chewing tobacco. These can delay bone healing. If you need help quitting, ask your health care provider.  Keep all follow-up visits. This is important. Follow-up visits include visits for physical therapy. Contact a health care provider if you have:  A fever.  Redness, swelling, or more pain around your incision.  Fluid or blood coming from your incision.  Pus or a bad smell coming from your incision.  Pain  that is not controlled by your pain medicine.  Increasing hoarseness or trouble swallowing. Get help right away if you have:  Severe pain.  Sudden numbness or weakness in your arms.  Warmth, tenderness, or swelling in your calf.  Chest pain.  Difficulty breathing. These symptoms may represent a serious problem that is an emergency. Do not wait to see if the symptoms will go away. Get medical help right away. Call your local emergency services (911 in the [place] Do not drive yourself to the hospital. Summary  After the procedure, it is common to have neck pain, discomfort when swallowing, and slight hoarseness.  Follow instructions from your health care provider about how to take care of your incision.  Check your incision area every day for signs of infection.  Return to your normal activities as told by your health care provider. Ask your health care provider what activities are safe for you.  Contact  a health care provider if you have signs of infection at your incision. This information is not intended to replace advice given to you by your health care provider. Make sure you discuss any questions you have with your health care provider. Document Revised: 09/03/2019 Document Reviewed: 09/03/2019 Elsevier Patient Education  2021 [employer].

## 2020-06-08 NOTE — Evaluation (Signed)
Physical Therapy Evaluation Patient Details Name: Kevin Villa MRN: [id number] DOB: Dec 10, 1955 Today's Date: 06/08/2020   History of Present Illness  65 yo male s/p ACDF C3-5. PMH including arthritis, asthma, HTN, and R THA (2019).  Clinical Impression  Pt admitted with above diagnosis. At the time of PT eval, pt was able to demonstrate transfers and ambulation with gross min guard assist and RW for support. Pt reports feeling more comfortable with RW. Pt was educated on precautions, brace application/wearing schedule, appropriate activity progression, and car transfer. Pt currently with functional limitations due to the deficits listed below (see PT Problem List). Pt will benefit from skilled PT to increase their independence and safety with mobility to allow discharge to the venue listed below.      Follow Up Recommendations Home health PT;Supervision for mobility/OOB    Equipment Recommendations  Rolling walker with 5" wheels    Recommendations for Other Services       Precautions / Restrictions Precautions Precautions: Cervical;Fall Precaution Booklet Issued: Yes (comment) Precaution Comments: Provided education and handout on compensatory techniques for ADLs. Pt able to verbalize cervical rpecautions Required Braces or Orthoses: Cervical Brace Cervical Brace: Soft collar;At all times Restrictions Weight Bearing Restrictions: No      Mobility  Bed Mobility       General bed mobility comments: Pt was received sitting up EOB.    Transfers Overall transfer level: Needs assistance Equipment used: Rolling walker (2 wheeled) Transfers: Sit to/from Stand Sit to Stand: Min guard         General transfer comment: Min Guard A for safety  Ambulation/Gait Ambulation/Gait assistance: Land (Feet): 250 Feet Assistive device: Rolling walker (2 wheeled) Gait Pattern/deviations: Step-through pattern;Decreased stride length;Shuffle;Trunk flexed Gait  velocity: Decreased Gait velocity interpretation: <1.31 ft/sec, indicative of household ambulator General Gait Details: VC's for improved posture, closer walker proximity, increased heel strike, and increased floor clearance. Overall pt steady with RW however as pt fatigued with ambulation, shuffling pattern increased.  Stairs Stairs: Yes Stairs assistance: Min guard Stair Management: One rail Right;Step to pattern;Forwards Number of Stairs: 4 (1 step x4 attempts) General stair comments: VC's for sequencing and general safety.  Wheelchair Mobility    Modified Rankin (Stroke Patients Only)       Balance Overall balance assessment: Needs assistance Sitting-balance support: No upper extremity supported;Feet supported Sitting balance-Leahy Scale: Good     Standing balance support: No upper extremity supported;During functional activity Standing balance-Leahy Scale: Fair                               Pertinent Vitals/Pain Pain Assessment: Faces Faces Pain Scale: Hurts little more Pain Location: Neck Pain Descriptors / Indicators: Discomfort Pain Intervention(s): Limited activity within patient's tolerance;Monitored during session;Repositioned    Home Living Family/patient expects to be discharged to:: Private residence Living Arrangements: Alone Available Help at Discharge: Family;Available PRN/intermittently (Sisters) Type of Home: Apartment Home Access: Stairs to enter   Secretary/administrator of Steps: 1 Home Layout: One level Home Equipment: Crutches      Prior Function Level of Independence: Independent         Comments: Performs ADls, IADLs; does not drive. sisters bring to/from store     Hand Dominance   Dominant Hand: Left    Extremity/Trunk Assessment   Upper Extremity Assessment Upper Extremity Assessment: Defer to OT evaluation RUE Deficits / Details: Decreased grasp strength compared to left. Decreased fine  motor coorindation and  reporting his hands feel "slippery" RUE Coordination: decreased fine motor;decreased gross motor LUE Deficits / Details: Decreased fine motor coorindation and reporting his hands feel "slippery" LUE Coordination: decreased fine motor;decreased gross motor    Lower Extremity Assessment Lower Extremity Assessment: RLE deficits/detail;LLE deficits/detail RLE Deficits / Details: Decreased strength and muscular endurance. Noted pt with decreased heel strike and gross shuffling type pattern however able to somewhat correct with cues. Unable to maintain. LLE Deficits / Details: Decreased strength and muscular endurance, however not as much as the RLE. Able to make corrective changes with cues for heel strike and floor clearance.    Cervical / Trunk Assessment Cervical / Trunk Assessment: Other exceptions Cervical / Trunk Exceptions: s/p ACDF  Communication   Communication: No difficulties  Cognition Arousal/Alertness: Awake/alert Behavior During Therapy: WFL for tasks assessed/performed Overall Cognitive Status: Impaired/Different from baseline Area of Impairment: Problem solving                             Problem Solving: Slow processing;Requires verbal cues General Comments: Pt requiring increased time and cues. Very motivated and agreeable to therapy      General Comments      Exercises     Assessment/Plan    PT Assessment Patient needs continued PT services  PT Problem List Decreased strength;Decreased activity tolerance;Decreased balance;Decreased mobility;Decreased knowledge of use of DME;Decreased safety awareness;Decreased knowledge of precautions;Pain       PT Treatment Interventions DME instruction;Gait training;Stair training;Functional mobility training;Neuromuscular re-education;Therapeutic activities;Therapeutic exercise;Patient/family education    PT Goals (Current goals can be found in the Care Plan section)  Acute Rehab PT Goals Patient Stated Goal:  Go home PT Goal Formulation: With patient Time For Goal Achievement: 06/15/20 Potential to Achieve Goals: Good    Frequency Min 5X/week   Barriers to discharge Decreased caregiver support Lives alone. Will have some support from sisters but not 24 hour support.    Co-evaluation               AM-PAC PT "6 Clicks" Mobility  Outcome Measure Help needed turning from your back to your side while in a flat bed without using bedrails?: None Help needed moving from lying on your back to sitting on the side of a flat bed without using bedrails?: A Little Help needed moving to and from a bed to a chair (including a wheelchair)?: A Little Help needed standing up from a chair using your arms (e.g., wheelchair or bedside chair)?: A Little Help needed to walk in hospital room?: A Little Help needed climbing 3-5 steps with a railing? : A Little 6 Click Score: 19    End of Session Equipment Utilized During Treatment: Gait belt;Cervical collar Activity Tolerance: Patient tolerated treatment well Patient left: in bed;with call bell/phone within reach (Sitting EOB) Nurse Communication: Mobility status PT Visit Diagnosis: Unsteadiness on feet (R26.81);Pain Pain - part of body:  (neck/incision site)    Time: 0912-0926 PT Time Calculation (min) (ACUTE ONLY): 14 min   Charges:   PT Evaluation $PT Eval Moderate Complexity: 1 Mod          Conni Slipper, PT, DPT Acute Rehabilitation Services Pager: [phone] Office: [phone]   Marylynn Pearson 06/08/2020, 12:07 PM

## 2020-06-08 NOTE — Progress Notes (Signed)
Orthopedic Tech Progress Note Patient Details:  Kevin Villa 1956/02/26 [id number]  Patient ID: Kevin Villa, male   DOB: 1955/12/31, 65 y.o.   MRN: [id number] Pt has collar  Trinna Post 06/08/2020, 6:48 AM

## 2020-06-08 NOTE — Progress Notes (Signed)
Pt doing well. Pt given D/C instructions with verbal understanding. Rx's were sent to the pharmacy by MD. Pt's incision is clean and dry with no sign of infection. Pt's IV was removed prior to D/C. Pt received RW and 3-n-1 from [employer] per MD order. Pt is stable @ D/C and has no other needs at this time. Rema Fendt, RN

## 2020-06-08 NOTE — Discharge Summary (Signed)
Physician Discharge Summary  Patient ID: Kevin Villa MRN: [id number] DOB/AGE: 1956/03/07 65 y.o.  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 The patient tolerated the procedure well and was taken to the recovery room and then to the floor in stable condition. The hospital course was routine. There were no complications. The wound remained clean dry and intact. Pt had appropriate neck soreness. No complaints of arm pain or new N/T/W. The patient remained afebrile with stable vital signs, and tolerated a regular diet. The patient continued to increase activities, and pain was well controlled with oral pain medications.   Consults: None  Significant Diagnostic Studies:  Results for orders placed or performed during the hospital encounter of 06/07/20  Glucose, capillary  Result Value Ref Range   Glucose-Capillary 105 (H) 70 - 99 mg/dL    DG Cervical Spine 2-3 Views  Result Date: 06/07/2020 CLINICAL DATA:  C3-5 ACDF. EXAM: CERVICAL SPINE - 2-3 VIEW; DG C-ARM 1-60 MIN COMPARISON:  None. FINDINGS: Two lateral fluoroscopic spot views of the cervical spine obtained in the operating room. Anterior fusion C3 through C5 with interbody spacers. Fluoroscopy time 13 seconds. Dose 1.82 mGy. IMPRESSION: Intraoperative fluoroscopy during C3-C5 ACDF. Electronically Signed   By: Narda Rutherford M.D.   On: 06/07/2020 18:41   DG C-Arm 1-60 Min  Result Date: 06/07/2020 CLINICAL DATA:  C3-5 ACDF. EXAM: CERVICAL SPINE - 2-3 VIEW; DG C-ARM 1-60 MIN COMPARISON:  None. FINDINGS: Two lateral fluoroscopic spot views of the cervical spine obtained in the operating room. Anterior fusion C3 through  C5 with interbody spacers. Fluoroscopy time 13 seconds. Dose 1.82 mGy. IMPRESSION: Intraoperative fluoroscopy during C3-C5 ACDF. Electronically Signed   By: Narda Rutherford M.D.   On: 06/07/2020 18:41    Antibiotics:  Anti-infectives (From admission, onward)   Start     Dose/Rate Route Frequency Ordered Stop   06/07/20 2230  ceFAZolin (ANCEF) IVPB 2g/100 mL premix        2 g 200 mL/hr over 30 Minutes Intravenous Every 8 hours 06/07/20 1951 06/08/20 0559   06/07/20 1100  ceFAZolin (ANCEF) IVPB 2g/100 mL premix        2 g 200 mL/hr over 30 Minutes Intravenous On call to O.R. 06/07/20 1045 06/07/20 1433      Discharge Exam: Blood pressure 134/66, pulse 77, temperature 98.4 F (36.9 C), temperature source Oral, resp. rate 18, height 5\' 10"  (1.778 m), weight 108.9 kg, SpO2 97 %. Neurologic: Grossly normal Ambulating and voiding well, incision cdi  Discharge Medications:   Allergies as of 06/08/2020      Reactions   Peach Flavor Hives   Peanut-containing Drug Products Other (See Comments)   Peanuts make mouth raw   Tomato Hives, Itching      Medication List    STOP taking these medications   naproxen 500 MG tablet Commonly known as: NAPROSYN     TAKE these medications   amLODipine 5 MG tablet Commonly known as: NORVASC Take 1 tablet (5 mg total) by mouth daily.   methocarbamol 500 MG tablet Commonly known as: Robaxin Take 1 tablet (500 mg total) by mouth 4 (four) times daily.   oxyCODONE-acetaminophen 5-325 MG tablet Commonly known as: Percocet Take 1  tablet by mouth every 4 (four) hours as needed for severe pain.   pravastatin 20 MG tablet Commonly known as: PRAVACHOL Take 1 tablet (20 mg total) by mouth every evening.   senna-docusate 8.6-50 MG tablet Commonly known as: Senokot-S Take 1 tablet by mouth 2 (two) times daily. What changed: when to take this   tiZANidine 4 MG tablet Commonly known as: Zanaflex Take 1 tablet (4 mg total) by mouth every 6 (six)  hours as needed for muscle spasms.       Disposition: home   Final Dx: acdf c3-4, 4-5  Discharge Instructions     Remove dressing in 72 hours   Complete by: As directed    Call MD for:  difficulty breathing, headache or visual disturbances   Complete by: As directed    Call MD for:  hives   Complete by: As directed    Call MD for:  persistant dizziness or light-headedness   Complete by: As directed    Call MD for:  persistant nausea and vomiting   Complete by: As directed    Call MD for:  redness, tenderness, or signs of infection (pain, swelling, redness, odor or green/yellow discharge around incision site)   Complete by: As directed    Call MD for:  severe uncontrolled pain   Complete by: As directed    Call MD for:  temperature >100.4   Complete by: As directed    Diet - low sodium heart healthy   Complete by: As directed    Driving Restrictions   Complete by: As directed    No driving for 2 weeks, no riding in the car for 1 week   Increase activity slowly   Complete by: As directed    Lifting restrictions   Complete by: As directed    No lifting more than 8 lbs         Signed: Tiana Loft Lewis Keats 06/08/2020, 8:09 AM

## 2020-06-08 NOTE — Evaluation (Addendum)
Occupational Therapy Evaluation Patient Details Name: Kevin Villa MRN: [id number] DOB: 29-Oct-1955 Today's Date: 06/08/2020    History of Present Illness 65 yo male s/p ACDF C3-5. PMH including arthritis, asthma, HTN, and R THA (2019).   Clinical Impression   PTA, pt was living alone and was independent; reports his sisters plan to check in at dc. Currently, pt requires Min Guard A for ADLs and functional mobility. Provided education and hand out on cervical precautions, bed mobility, collar management, grooming, UB ADLs, LB ADLs with AE, toileting, and tub transfer. Pt presenting with decreased balance, strength, activity tolerance, and functional use of BUEs. Recommend dc home with HHOT to optimize safety, independence, and return to PLOF. once medically stable per physician. Plan for dc later today and defer further OT needs to [facility] services. Answered all questions in preparation for dc.    Follow Up Recommendations  Home health OT;Supervision - Intermittent    Equipment Recommendations  Tub/shower seat    Recommendations for Other Services PT consult     Precautions / Restrictions Precautions Precautions: Cervical;Fall Precaution Booklet Issued: Yes (comment) Precaution Comments: Provided education and handout on compensatory techniques for ADLs. Pt able to verbalize cervical rpecautions Required Braces or Orthoses: Cervical Brace Cervical Brace: Soft collar;At all times      Mobility Bed Mobility Overal bed mobility: Needs Assistance Bed Mobility: Rolling;Sidelying to Sit Rolling: Supervision Sidelying to sit: Supervision       General bed mobility comments: Supervision for safety during log roll tehcnique    Transfers Overall transfer level: Needs assistance Equipment used: None Transfers: Sit to/from Stand Sit to Stand: Min guard         General transfer comment: Min Guard A for safety    Balance Overall balance assessment: Needs  assistance Sitting-balance support: No upper extremity supported;Feet supported Sitting balance-Leahy Scale: Good     Standing balance support: No upper extremity supported;During functional activity Standing balance-Leahy Scale: Fair                             ADL either performed or assessed with clinical judgement   ADL Overall ADL's : Needs assistance/impaired Eating/Feeding: Set up;Sitting   Grooming: Set up;Supervision/safety;Standing Grooming Details (indicate cue type and reason): Educating pt on compensatory techniques for oral care Upper Body Bathing: Supervision/ safety;Set up;Sitting   Lower Body Bathing: Min guard;Sit to/from stand   Upper Body Dressing : Supervision/safety;Set up;Sitting;Minimal assistance Upper Body Dressing Details (indicate cue type and reason): Educating pt on compensatory techniques for UB dressing. Pt donning shirt with SUpervision using techniques. Pt donning cervical collar with Min A Lower Body Dressing: Min guard;Sit to/from stand;With adaptive equipment Lower Body Dressing Details (indicate cue type and reason): Eduacating pt on compensatory techniques. Pt donning underwear and pants with increased time and effort. Providing education on use of sock aide and pt demonstrating understanding. Toilet Transfer: Min guard;Ambulation Toilet Transfer Details (indicate cue type and reason): Close MIn Guard A for safety     Tub/ Shower Transfer: Min guard;Ambulation;Tub transfer Tub/Shower Transfer Details (indicate cue type and reason): Educating pt on tub transfer technique. Pt performing simulated transfer with Min Guard A. Functional mobility during ADLs: Min guard General ADL Comments: Pt presenting with decreased strength, balance, and functional use of BUEs.     Vision         Perception     Praxis      Pertinent Vitals/Pain Pain Assessment: Faces  Faces Pain Scale: Hurts little more Pain Location: Neck Pain Descriptors  / Indicators: Discomfort Pain Intervention(s): Monitored during session;Repositioned     Hand Dominance Left   Extremity/Trunk Assessment Upper Extremity Assessment Upper Extremity Assessment: LUE deficits/detail;RUE deficits/detail RUE Deficits / Details: Decreased grasp strength compared to left. Decreased fine motor coorindation and reporting his hands feel "slippery" RUE Coordination: decreased fine motor;decreased gross motor LUE Deficits / Details: Decreased fine motor coorindation and reporting his hands feel "slippery" LUE Coordination: decreased fine motor;decreased gross motor   Lower Extremity Assessment Lower Extremity Assessment: Defer to PT evaluation   Cervical / Trunk Assessment Cervical / Trunk Assessment: Other exceptions Cervical / Trunk Exceptions: s/p ACDF   Communication Communication Communication: No difficulties   Cognition Arousal/Alertness: Awake/alert Behavior During Therapy: WFL for tasks assessed/performed Overall Cognitive Status: Impaired/Different from baseline Area of Impairment: Problem solving                             Problem Solving: Slow processing;Requires verbal cues General Comments: Pt requiring increased time and cues. Very motivated and agreeable to therapy   General Comments       Exercises     Shoulder Instructions      Home Living Family/patient expects to be discharged to:: Private residence Living Arrangements: Alone Available Help at Discharge: Family;Available PRN/intermittently (Sisters) Type of Home: Apartment Home Access: Stairs to enter Secretary/administrator of Steps: 1   Home Layout: One level     Bathroom Shower/Tub: Chief Strategy Officer: Standard     Home Equipment: Crutches          Prior Functioning/Environment Level of Independence: Independent        Comments: Performs ADls, IADLs; does not drive. sisters bring to/from store        OT Problem List:  Decreased strength;Decreased range of motion;Decreased activity tolerance;Impaired balance (sitting and/or standing);Decreased knowledge of use of DME or AE;Decreased knowledge of precautions;Pain;Impaired UE functional use      OT Treatment/Interventions:      OT Goals(Current goals can be found in the care plan section) Acute Rehab OT Goals Patient Stated Goal: Go home OT Goal Formulation: All assessment and education complete, DC therapy  OT Frequency:     Barriers to D/C:            Co-evaluation              AM-PAC OT "6 Clicks" Daily Activity     Outcome Measure Help from another person eating meals?: None Help from another person taking care of personal grooming?: A Little Help from another person toileting, which includes using toliet, bedpan, or urinal?: A Little Help from another person bathing (including washing, rinsing, drying)?: A Little Help from another person to put on and taking off regular upper body clothing?: A Little Help from another person to put on and taking off regular lower body clothing?: A Little 6 Click Score: 19   End of Session Equipment Utilized During Treatment: Cervical collar Nurse Communication: Mobility status  Activity Tolerance: Patient tolerated treatment well Patient left: in bed;with call bell/phone within reach (EOB)  OT Visit Diagnosis: Unsteadiness on feet (R26.81);Other abnormalities of gait and mobility (R26.89);Muscle weakness (generalized) (M62.81);Pain Pain - part of body:  (Neck)                Time: 4854-6270 OT Time Calculation (min): 19 min Charges:  OT General Charges $OT Visit: 1  Visit OT Evaluation $OT Eval Low Complexity: 1 Low  Shanautica Forker MSOT, OTR/L Acute Rehab Pager: [phone] Office: [phone]  Theodoro Grist Genevra Orne 06/08/2020, 10:24 AM

## 2020-06-08 NOTE — TOC Progression Note (Signed)
Transition of Care [facility]) - Progression Note    Patient Details  Name: Kevin Villa MRN: [id number] Date of Birth: 1956/01/23  Transition of Care [facility]) CM/SW Contact  Beckie Busing, RN Phone Number: [phone]  06/08/2020, 9:39 AM  Clinical Narrative:  CM spoke with patient to determine if patient is able to go to outpatient therapy. Patient verified that he is able to get to outpatient therapy.  Ambulatory referral for PT/OT has been entered. Info is on avs. No further needs noted at this time. TOC will sign off.         Expected Discharge Plan and Services           Expected Discharge Date: 06/08/20                                     Social Determinants of Health (SDOH) Interventions    Readmission Risk Interventions No flowsheet data found.

## 2020-06-10 ENCOUNTER — Ambulatory Visit: Payer: Medicare Other | Admitting: Nurse Practitioner

## 2020-06-10 NOTE — Progress Notes (Deleted)
Primary Care Physician:  Wilson Singer, MD Primary Gastroenterologist:  Dr. Jena Gauss  No chief complaint on file.   HPI:   Kevin Villa is a 65 y.o. male who presents on referral from primary care for constipation.  Reviewed information provided with the referral including office visit dated 04/27/2020 for constipation and anemia.  At that time the patient noted increased constipation over the previous 2 weeks, and frequent stools which are hard and require straining.  Stool softener without improvement.  Currently drinks 30 to 40 ounces of water a day.  He is mildly anemic and has never had a colonoscopy.  Recommended referral to GI for further evaluation and to schedule colonoscopy.  Recommend he drink more water, approximately 120 ounces of water a day, and increase dietary fiber.  No history of colonoscopy or endoscopy in our system.  Today he states he is doing okay overall.  Past Medical History:  Diagnosis Date  . Arthritis    osteoarthritis   . Asthma    childhood, no inhaler, no problems as an adult  . Cervical spinal stenosis   . GERD (gastroesophageal reflux disease)    occ   . Headache    occasional  . Hematuria   . Hyperlipidemia   . Hypertension   . Pneumonia    x 1 years ago   . Pre-diabetes    no meds    Past Surgical History:  Procedure Laterality Date  . ANTERIOR CERVICAL DECOMP/DISCECTOMY FUSION N/A 06/07/2020   Procedure: ANTERIOR CERVICAL DECOMPRESSION AND FUSION CERVICAL THREE-FOUR, CERVICAL FOUR-FIVE.;  Surgeon: Donalee Citrin, MD;  Location: [facility] OR;  Service: Neurosurgery;  Laterality: N/A;  anterior  . HERNIA REPAIR    . right hand surgery     pinky finger   . TOTAL HIP ARTHROPLASTY Right 11/06/2017   Procedure: RIGHT TOTAL HIP ARTHROPLASTY ANTERIOR APPROACH;  Surgeon: Durene Romans, MD;  Location: [facility] ORS;  Service: Orthopedics;  Laterality: Right;  70 mins    Current Outpatient Medications  Medication Sig Dispense Refill  . amLODipine  (NORVASC) 5 MG tablet Take 1 tablet (5 mg total) by mouth daily. 90 tablet 1  . methocarbamol (ROBAXIN) 500 MG tablet Take 1 tablet (500 mg total) by mouth 4 (four) times daily. 45 tablet 0  . oxyCODONE-acetaminophen (PERCOCET) 5-325 MG tablet Take 1 tablet by mouth every 4 (four) hours as needed for severe pain. 20 tablet 0  . pravastatin (PRAVACHOL) 20 MG tablet Take 1 tablet (20 mg total) by mouth every evening. 90 tablet 1  . senna-docusate (SENOKOT-S) 8.6-50 MG tablet Take 1 tablet by mouth 2 (two) times daily. (Patient taking differently: Take 1 tablet by mouth daily.) 60 tablet 3  . tiZANidine (ZANAFLEX) 4 MG tablet Take 1 tablet (4 mg total) by mouth every 6 (six) hours as needed for muscle spasms. 30 tablet 6   No current facility-administered medications for this visit.    Allergies as of 06/10/2020 - Review Complete 06/07/2020  Allergen Reaction Noted  . Peach flavor Hives 06/07/2020  . Peanut-containing drug products Other (See Comments) 12/23/2010  . Tomato Hives and Itching 12/23/2010    Family History  Problem Relation Age of Onset  . Diabetes Mother   . Hypertension Mother   . Hypertension Father   . Cancer Brother        Unsure of type  . Hypertension Sister   . Diabetes Sister   . Hypertension Sister   . Hypertension Sister   .  Hypertension Sister     Social History   Socioeconomic History  . Marital status: Legally Separated    Spouse name: Not on file  . Number of children: 0  . Years of education: 11 grade  . Highest education level: 11th grade  Occupational History  . Occupation: disability  Tobacco Use  . Smoking status: Former Smoker    Packs/day: 0.25    Years: 30.00    Pack years: 7.50    Types: Cigarettes    Quit date: 2004    Years since quitting: 18.0  . Smokeless tobacco: Never Used  Vaping Use  . Vaping Use: Never used  Substance and Sexual Activity  . Alcohol use: Not Currently    Comment: occasional drinks gin or beer  . Drug  use: No  . Sexual activity: Never    Partners: Female  Other Topics Concern  . Not on file  Social History Narrative   Live in [place], [place]. On disability for hip pain.   Divorced, not dating.   No children.   Has friends and family.   Lives with mother or lives in his camper.   No pets.   Drinks about a 12 pack a weekend. Does not drink on a daily basis.    Does not drive b/c they expired and did not pay to get them renewed.    4 brothers and 4 sisters.    Left-handed.   No daily use of caffeine, occasional coffee.      Social Determinants of Health   Financial Resource Strain: Not on file  Food Insecurity: Not on file  Transportation Needs: Not on file  Physical Activity: Not on file  Stress: Not on file  Social Connections: Not on file  Intimate Partner Violence: Not on file    Subjective:*** Review of Systems  Constitutional: Negative for chills, fever, malaise/fatigue and weight loss.  HENT: Negative for congestion and sore throat.   Respiratory: Negative for cough and shortness of breath.   Cardiovascular: Negative for chest pain and palpitations.  Gastrointestinal: Negative for abdominal pain, blood in stool, diarrhea, melena, nausea and vomiting.  Musculoskeletal: Negative for joint pain and myalgias.  Skin: Negative for rash.  Neurological: Negative for dizziness and weakness.  Endo/Heme/Allergies: Does not bruise/bleed easily.  Psychiatric/Behavioral: Negative for depression. The patient is not nervous/anxious.   All other systems reviewed and are negative.      Objective: There were no vitals taken for this visit. Physical Exam Vitals and nursing note reviewed.  Constitutional:      General: He is not in acute distress.    Appearance: Normal appearance. He is not ill-appearing, toxic-appearing or diaphoretic.  HENT:     Head: Normocephalic and atraumatic.     Nose: No congestion or rhinorrhea.  Eyes:     General: No scleral icterus. Cardiovascular:      Rate and Rhythm: Normal rate and regular rhythm.     Heart sounds: Normal heart sounds.  Pulmonary:     Effort: Pulmonary effort is normal.     Breath sounds: Normal breath sounds.  Abdominal:     General: Bowel sounds are normal. There is no distension.     Palpations: Abdomen is soft. There is no hepatomegaly, splenomegaly or mass.     Tenderness: There is no abdominal tenderness. There is no guarding or rebound.     Hernia: No hernia is present.  Musculoskeletal:     Cervical back: Neck supple.  Skin:  General: Skin is warm and dry.     Coloration: Skin is not jaundiced.     Findings: No bruising or rash.  Neurological:     General: No focal deficit present.     Mental Status: He is alert and oriented to person, place, and time. Mental status is at baseline.  Psychiatric:        Mood and Affect: Mood normal.        Behavior: Behavior normal.        Thought Content: Thought content normal.      Assessment:  ***   Plan: ***    Thank you for allowing [place] to participate in the care of Sarah A Shrout  Wynne Dust, DNP, AGNP-C Adult & Gerontological Nurse Practitioner [facility]   06/10/2020 1:22 PM   Disclaimer: This note was dictated with voice recognition software. Similar sounding words can inadvertently be transcribed and may not be corrected upon review.

## 2020-06-14 ENCOUNTER — Telehealth (INDEPENDENT_AMBULATORY_CARE_PROVIDER_SITE_OTHER): Payer: Self-pay | Admitting: Nurse Practitioner

## 2020-06-14 ENCOUNTER — Other Ambulatory Visit: Payer: Self-pay

## 2020-06-14 ENCOUNTER — Encounter (INDEPENDENT_AMBULATORY_CARE_PROVIDER_SITE_OTHER): Payer: Medicare Other | Admitting: Nurse Practitioner

## 2020-06-14 ENCOUNTER — Encounter (INDEPENDENT_AMBULATORY_CARE_PROVIDER_SITE_OTHER): Payer: Self-pay

## 2020-06-14 NOTE — Telephone Encounter (Signed)
I attempted to call this patient 3 times to get his started on his virtual visit that was scheduled for this morning.  He did not answer but I was able to leave a voicemail asking him to call the office back to reschedule.  Clydie Braun, we will try to call this patient sometime this week to get his office visit rescheduled?  Thank you.

## 2020-06-14 NOTE — Progress Notes (Signed)
This encounter was created in error - please disregard.

## 2020-06-16 NOTE — Telephone Encounter (Signed)
Patient is scheduled for 07/15/20 at 11:30

## 2020-06-17 ENCOUNTER — Emergency Department (HOSPITAL_COMMUNITY)
Admission: EM | Admit: 2020-06-17 | Discharge: 2020-06-17 | Discharge status: Against Medical Advice | Payer: Medicare Other

## 2020-06-17 NOTE — ED Triage Notes (Signed)
Left prior to traige

## 2020-07-15 ENCOUNTER — Encounter (INDEPENDENT_AMBULATORY_CARE_PROVIDER_SITE_OTHER): Payer: Self-pay | Admitting: Nurse Practitioner

## 2020-07-15 ENCOUNTER — Other Ambulatory Visit: Payer: Self-pay

## 2020-07-15 ENCOUNTER — Ambulatory Visit (INDEPENDENT_AMBULATORY_CARE_PROVIDER_SITE_OTHER): Payer: Medicare Other | Admitting: Nurse Practitioner

## 2020-07-15 VITALS — BP 143/85 | HR 86 | Temp 97.2°F | Ht 70.0 in | Wt 231.8 lb

## 2020-07-15 DIAGNOSIS — R300 Dysuria: Secondary | ICD-10-CM

## 2020-07-15 DIAGNOSIS — E785 Hyperlipidemia, unspecified: Secondary | ICD-10-CM

## 2020-07-15 DIAGNOSIS — R351 Nocturia: Secondary | ICD-10-CM

## 2020-07-15 DIAGNOSIS — K5900 Constipation, unspecified: Secondary | ICD-10-CM

## 2020-07-15 DIAGNOSIS — I10 Essential (primary) hypertension: Secondary | ICD-10-CM

## 2020-07-15 DIAGNOSIS — M4800 Spinal stenosis, site unspecified: Secondary | ICD-10-CM | POA: Diagnosis not present

## 2020-07-15 MED ORDER — SENNOSIDES-DOCUSATE SODIUM 8.6-50 MG PO TABS: ORAL_TABLET | ORAL | 3 refills | Status: DC

## 2020-07-15 MED ORDER — AMLODIPINE BESYLATE 10 MG PO TABS: 10.0000 mg | ORAL_TABLET | Freq: Every day | ORAL | 0 refills | Status: DC

## 2020-07-15 NOTE — Patient Instructions (Addendum)
Senna-S: Take 1 tablets by mouth twice a day as needed.

## 2020-07-15 NOTE — Progress Notes (Signed)
Subjective:  Patient ID: Kevin Villa, male    DOB: 02-Apr-1956  Age: 65 y.o. MRN: [id number]  CC:  Chief Complaint  Patient presents with  . Constipation  . Other    Tremors/headache, dysuria/nocturia  . Hyperlipidemia  . Hypertension      HPI  This patient arrives today for the above.  Constipation: He was told previously to hydrate with water and to take senna S as needed, he tells me he has not been taking this medication but only has a bowel movement approximately 2 times a week.  Tremors/headache/spinal stenosis: He underwent cervical decompression on January 10.  He tells me that his symptoms have much improved.  He still experiences some intermittent numbness in his hands but he believes this is related to sleeping positions because the numbness seems to only occur with sleep.  It will improve once he gets up and starts moving around.  He tells me he feels his strength is improved.  Dysuria/nocturia: He does report some mild burning with pain and frequent urination at night.  He denies any hematuria.  He has had a history of hematuria and has been urology in the past for this.  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  Last lipid panel was collected in April 2019 his LDL was 105 at that time.  He continues on pravastatin 20 mg every evening.  Hypertension: He is on amlodipine 5 mg daily for hypertension.  Past Medical History:  Diagnosis Date  . Arthritis    osteoarthritis   . Asthma    childhood, no inhaler, no problems as an adult  . Cervical spinal stenosis   . GERD (gastroesophageal reflux disease)    occ   . Headache    occasional  . Hematuria   . Hyperlipidemia   . Hypertension   . Pneumonia    x 1 years ago   . Pre-diabetes    no meds      Family History  Problem Relation Age of Onset  . Diabetes Mother   . Hypertension Mother   . Hypertension Father   . Cancer  Brother        Unsure of type  . Hypertension Sister   . Diabetes Sister   . Hypertension Sister   . Hypertension Sister   . Hypertension Sister     Social History   Social History Narrative   Live in [place], [place]. On disability for hip pain.   Divorced, not dating.   No children.   Has friends and family.   Lives with mother or lives in his camper.   No pets.   Drinks about a 12 pack a weekend. Does not drink on a daily basis.    Does not drive b/c they expired and did not pay to get them renewed.    4 brothers and 4 sisters.    Left-handed.   No daily use of caffeine, occasional coffee.      Social History   Tobacco Use  . Smoking status: Former Smoker    Packs/day: 0.25    Years: 30.00    Pack years: 7.50    Types: Cigarettes    Quit date: 2004    Years since quitting: 18.1  . Smokeless tobacco: Never Used  Substance Use Topics  . Alcohol use: Not Currently    Comment: occasional drinks gin or beer     Current  Meds  Medication Sig  . pravastatin (PRAVACHOL) 20 MG tablet Take 1 tablet (20 mg total) by mouth every evening.  . [DISCONTINUED] amLODipine (NORVASC) 5 MG tablet Take 1 tablet (5 mg total) by mouth daily.  . [DISCONTINUED] senna-docusate (SENOKOT-S) 8.6-50 MG tablet Take 1 tablet by mouth 2 (two) times daily. (Patient taking differently: Take 1 tablet by mouth daily.)    ROS:  Review of Systems  Constitutional: Negative for fever.  Eyes: Negative for blurred vision.  Respiratory: Negative for shortness of breath.   Cardiovascular: Negative for chest pain.  Gastrointestinal: Positive for constipation. Negative for abdominal pain and blood in stool.  Genitourinary: Positive for dysuria. Negative for hematuria.       (+) nocturia  Neurological: Positive for tremors and sensory change (Numbness with sleeping positions). Negative for dizziness, weakness and headaches.     Objective:   Today's Vitals: BP (!) 143/85   Pulse 86   Temp (!) 97.2 F (36.2  C)   Ht 5' 10"  (1.778 m)   Wt 231 lb 12.8 oz (105.1 kg)   SpO2 98%   BMI 33.26 kg/m  Vitals with BMI 07/15/2020 06/08/2020 06/08/2020  Height 5' 10"  - -  Weight 231 lbs 13 oz - -  BMI 56.31 - -  Systolic 497 026 378  Diastolic 85 66 61  Pulse 86 77 79     Physical Exam Vitals reviewed.  Constitutional:      Appearance: Normal appearance.  HENT:     Head: Normocephalic and atraumatic.  Cardiovascular:     Rate and Rhythm: Normal rate and regular rhythm.  Pulmonary:     Effort: Pulmonary effort is normal.     Breath sounds: Normal breath sounds.  Musculoskeletal:     Cervical back: Neck supple.  Skin:    General: Skin is warm and dry.  Neurological:     Mental Status: He is alert and oriented to person, place, and time.     Sensory: Sensory deficit present.     Motor: Tremor present.  Psychiatric:        Mood and Affect: Mood normal.        Behavior: Behavior normal.        Thought Content: Thought content normal.        Judgment: Judgment normal.          Assessment and Plan   1. Dysuria   2. HTN, goal below 140/90   3. Hyperlipidemia, unspecified hyperlipidemia type   4. Nocturia   5. Constipation, unspecified constipation type   6. Spinal stenosis, unspecified spinal region      Plan: 1.,  4.  We will check a UA with reflex to culture as well as check PSA. 2.  We will increase amlodipine to 10 mg by mouth daily he will follow-up in 2 to 4 weeks for close monitoring of blood pressure.  He was told if he has any negative side effects or feels unwell on the new medication dose to call the office.  Tells me understands. 3.  We will check the panel today for further evaluation. 5.  I encouraged him to take senna S over-the-counter as needed for constipation. 6.  Seems to have improved since cervical decompression surgery.  No further recommendations made today.   Tests ordered Orders Placed This Encounter  Procedures  . Urinalysis with Culture Reflex  .  PSA, Total with Reflex to PSA, Free  . CMP with eGFR([facility])  . Lipid Panel  Meds ordered this encounter  Medications  . amLODipine (NORVASC) 10 MG tablet    Sig: Take 1 tablet (10 mg total) by mouth daily.    Dispense:  90 tablet    Refill:  0    Order Specific Question:   Supervising Provider    Answer:   Hurshel Party C [[id number]]  . senna-docusate (SENOKOT-S) 8.6-50 MG tablet    Sig: Take 1 tablets by mouth twice a day as needed for constipation    Dispense:  60 tablet    Refill:  3    Order Specific Question:   Supervising Provider    Answer:   Doree Albee [[id number]]    Patient to follow-up in 2 to 4 weeks to monitor blood pressure as well as to discuss whether or not we need to make changes to medication regarding treatment of his hyperlipidemia.  Ailene Ards, NP

## 2020-07-16 LAB — URINALYSIS W MICROSCOPIC + REFLEX CULTURE
Bacteria, UA: NONE SEEN /HPF
Bilirubin Urine: NEGATIVE
Glucose, UA: NEGATIVE
Hgb urine dipstick: NEGATIVE
Hyaline Cast: NONE SEEN /LPF
Ketones, ur: NEGATIVE
Leukocyte Esterase: NEGATIVE
Nitrites, Initial: NEGATIVE
Protein, ur: NEGATIVE
RBC / HPF: NONE SEEN /HPF (ref 0–2)
Specific Gravity, Urine: 1.024 (ref 1.001–1.03)
Squamous Epithelial / HPF: NONE SEEN /HPF (ref <=5)
WBC, UA: NONE SEEN /HPF (ref 0–5)
pH: 5.5 (ref 5.0–8.0)

## 2020-07-16 LAB — COMPLETE METABOLIC PANEL WITH GFR
AG Ratio: 1.7 (calc) (ref 1.0–2.5)
ALT: 22 U/L (ref 9–46)
AST: 21 U/L (ref 10–35)
Albumin: 4.3 g/dL (ref 3.6–5.1)
Alkaline phosphatase (APISO): 79 U/L (ref 35–144)
BUN: 14 mg/dL (ref 7–25)
CO2: 31 mmol/L (ref 20–32)
Calcium: 9.6 mg/dL (ref 8.6–10.3)
Chloride: 103 mmol/L (ref 98–110)
Creat: 0.85 mg/dL (ref 0.70–1.25)
GFR, Est African American: 107 mL/min/{1.73_m2} (ref >=60)
GFR, Est Non African American: 93 mL/min/{1.73_m2} (ref >=60)
Globulin: 2.6 g/dL (calc) (ref 1.9–3.7)
Glucose, Bld: 106 mg/dL (ref 65–139)
Potassium: 4.2 mmol/L (ref 3.5–5.3)
Sodium: 141 mmol/L (ref 135–146)
Total Bilirubin: 0.4 mg/dL (ref 0.2–1.2)
Total Protein: 6.9 g/dL (ref 6.1–8.1)

## 2020-07-16 LAB — NO CULTURE INDICATED

## 2020-07-16 LAB — LIPID PANEL
Cholesterol: 191 mg/dL (ref <200)
HDL: 63 mg/dL (ref >=40)
LDL Cholesterol (Calc): 107 mg/dL (calc) — ABNORMAL HIGH
Non-HDL Cholesterol (Calc): 128 mg/dL (calc) (ref <130)
Total CHOL/HDL Ratio: 3 (calc) (ref <5.0)
Triglycerides: 115 mg/dL (ref <150)

## 2020-07-16 LAB — PSA, TOTAL WITH REFLEX TO PSA, FREE: PSA, Total: 1.7 ng/mL (ref <=4.0)

## 2020-07-19 ENCOUNTER — Telehealth (INDEPENDENT_AMBULATORY_CARE_PROVIDER_SITE_OTHER): Payer: Self-pay | Admitting: Nurse Practitioner

## 2020-07-19 ENCOUNTER — Encounter (INDEPENDENT_AMBULATORY_CARE_PROVIDER_SITE_OTHER): Payer: Self-pay | Admitting: Nurse Practitioner

## 2020-07-19 NOTE — Telephone Encounter (Signed)
Please print letter dated 07/19/20 authored by myself and mail to the patient. Thank you.  

## 2020-07-19 NOTE — Telephone Encounter (Signed)
Done

## 2020-08-10 NOTE — Progress Notes (Signed)
Chief Complaint  Patient presents with  . Follow-up    Rm 15, alone, states neck pain is better     HISTORICAL  Kevin Villa is a 65 year old male, seen in request by his primary care physician Dr. Karilyn Cota, [place] for evaluation of gait abnormality, neck pain, frequent headache, initial evaluation was on May 11, 2020.  I reviewed and summarized the referring note. PMHX. HTN HLD. History of right hip replacement  He complains of headaches since 2019, correlate with his worsening headache, bilateral upper and lower extremity paresthesia, gait abnormality, he described when he coughed, he felt shooting, electronic shock sensation throughout his spine, to his bilateral upper and lower extremity.  He also noticed increased gait abnormality, right leg the site he had right hip replacement often give out underneath him.  His headache often started from neck, spreading to occipital, parietal or temporal region, pressure, making him dizzy, it happens once or twice each week, lasting for few hours, he has tried over-the-counter Tylenol, ibuprofen with limited help,  After work-up, he was diagnosed with cervical myelopathy, is planning on has cervical decompression surgery by Dr. Conchita Paris in January 2022.  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 Wynetta Emery. Doing well, can walk better on the right leg, can stand without right leg giving out. Sometimes at night, certain positioning can still cause neck pain  anterior to surgical site. Didn't do any PT. Never had follow-up with Dr. Wynetta Emery. Headaches have gone away. Off pain medications. Thinks 95% better since surgery. Does feel constipated, no urinary incontinence. Doesn't drive. Getting ready to start riding his bike again. Still has limp on the right from prior hip replacement was 2 years ago. Better strength in hands.  REVIEW OF SYSTEMS: Full 14 system review of systems performed and notable only for as above  See HPI  ALLERGIES: Allergies  Allergen Reactions  . Peach Flavor Hives  . Peanut-Containing Drug Products Other (See Comments)    Peanuts make mouth raw  . Tomato Hives and Itching    HOME MEDICATIONS: Current Outpatient Medications  Medication Sig Dispense Refill  . amLODipine (NORVASC) 10 MG tablet Take 1 tablet (10 mg total) by mouth daily. 90 tablet 0  . pravastatin (PRAVACHOL) 20 MG tablet Take 1 tablet (20 mg total) by mouth every evening. 90 tablet 1  . senna-docusate (SENOKOT-S) 8.6-50 MG tablet Take 1 tablets by mouth twice a day as needed for constipation 60 tablet 3   No current facility-administered medications for this visit.    PAST MEDICAL HISTORY: Past Medical History:  Diagnosis Date  . Arthritis    osteoarthritis   . Asthma    childhood, no inhaler, no problems as an adult  . Cervical spinal stenosis   . GERD (gastroesophageal reflux disease)    occ   . Headache    occasional  . Hematuria   . Hyperlipidemia   . Hypertension   . Pneumonia    x 1 years ago   . Pre-diabetes    no meds    PAST  SURGICAL HISTORY: Past Surgical History:  Procedure Laterality Date  . ANTERIOR CERVICAL DECOMP/DISCECTOMY FUSION N/A 06/07/2020   Procedure: ANTERIOR CERVICAL DECOMPRESSION AND FUSION CERVICAL THREE-FOUR, CERVICAL FOUR-FIVE.;  Surgeon: Donalee Citrin, MD;  Location: [facility] OR;  Service: Neurosurgery;  Laterality: N/A;  anterior  . HERNIA REPAIR    . right hand surgery     pinky finger   . TOTAL HIP ARTHROPLASTY  Right 11/06/2017   Procedure: RIGHT TOTAL HIP ARTHROPLASTY ANTERIOR APPROACH;  Surgeon: Durene Romans, MD;  Location: [facility] ORS;  Service: Orthopedics;  Laterality: Right;  70 mins    FAMILY HISTORY: Family History  Problem Relation Age of Onset  . Diabetes Mother   . Hypertension Mother   . Hypertension Father   . Cancer Brother        Unsure of type  . Hypertension Sister   . Diabetes Sister   . Hypertension Sister   . Hypertension Sister   . Hypertension Sister     SOCIAL HISTORY: Social History   Socioeconomic History  . Marital status: Legally Separated    Spouse name: Not on file  . Number of children: 0  . Years of education: 11 grade  . Highest education level: 11th grade  Occupational History  . Occupation: disability  Tobacco Use  . Smoking status: Former Smoker    Packs/day: 0.25    Years: 30.00    Pack years: 7.50    Types: Cigarettes    Quit date: 2004    Years since quitting: 18.2  . Smokeless tobacco: Never Used  Vaping Use  . Vaping Use: Never used  Substance and Sexual Activity  . Alcohol use: Not Currently    Comment: occasional drinks gin or beer  . Drug use: No  . Sexual activity: Never    Partners: Female  Other Topics Concern  . Not on file  Social History Narrative   Live in [place], [place]. On disability for hip pain.   Divorced, not dating.   No children.   Has friends and family.   Lives with mother or lives in his camper.   No pets.   Drinks about a 12 pack a weekend. Does not drink on a daily basis.    Does not drive b/c they expired and did not pay to get them renewed.    4 brothers and 4 sisters.    Left-handed.   No daily use of caffeine, occasional coffee.      Social Determinants of Health   Financial Resource Strain: Not on file  Food Insecurity: Not on file  Transportation Needs: Not on file  Physical Activity: Not on file  Stress: Not on file  Social Connections: Not on file  Intimate Partner Violence: Not on file     PHYSICAL EXAM   Vitals:   08/11/20 1332  BP: (!) 148/82  Pulse: 84  Weight: 235 lb (106.6 kg)  Height: 5\' 10"  (1.778 m)   Not recorded     Body mass index is 33.72 kg/m.  PHYSICAL EXAMNIATION:  Gen: NAD, conversant, well nourised, well groomed                      NEUROLOGICAL EXAM:  MENTAL STATUS: Speech:    Speech is normal; fluent and spontaneous with normal comprehension.  Cognition:     Orientation to time, place and person     Normal recent and remote memory     Normal Attention span and concentration  Normal Language, naming, repeating,spontaneous speech     Fund of knowledge   CRANIAL NERVES: CN II: Visual fields are full to confrontation. Pupils are round equal and briskly reactive to light. CN III, IV, VI: extraocular movement are normal. No ptosis. CN V: Facial sensation is intact to light touch CN VII: Face is symmetric with normal eye closure  CN VIII: Hearing is normal to causal conversation. CN IX, X: Phonation is normal. CN XI: Head turning and shoulder shrug are intact  MOTOR: 4/5 right upper extremity, 4/5 right lower extremity strength  REFLEXES: Are increased throughout  SENSORY: Intact to soft touch  COORDINATION: Finger-nose-finger and heel-to-shin is normal  GAIT/STANCE: Able to stand from seated position with arms crossed, slight limp on the right, but is steady  DIAGNOSTIC DATA (LABS, IMAGING, TESTING) - I reviewed patient records, labs, notes, testing and imaging myself where available.  ASSESSMENT AND PLAN  Archibald A Tinnel is a 65 y.o. male   1. Cervical myelopathy 2. Frequent headaches  -Underwent ACDF C3-4, 4-5 on Jan 10 th with Dr. Wynetta Emery.  -Gait is improved, headaches resolved, still some limp on the right claims baseline post-hip replacement, feels overall 95% better -Never had follow-up with Dr. Wynetta Emery, recommend calling to schedule postop follow-up, is overall doing well -Continue routine follow-up with PCP,  follow-up at our office on an as-needed basis   I spent 20 minutes of face-to-face and non-face-to-face time with patient.  This included previsit chart review, lab review, study review, order entry, electronic health record documentation, patient education.  Otila Kluver, DNP  [facility] [address], [address] [place], [place] [address] [phone]

## 2020-08-11 ENCOUNTER — Encounter: Payer: Self-pay | Admitting: Neurology

## 2020-08-11 ENCOUNTER — Ambulatory Visit (INDEPENDENT_AMBULATORY_CARE_PROVIDER_SITE_OTHER): Payer: Medicare Other | Admitting: Neurology

## 2020-08-11 VITALS — BP 148/82 | HR 84 | Ht 70.0 in | Wt 235.0 lb

## 2020-08-11 DIAGNOSIS — G959 Disease of spinal cord, unspecified: Secondary | ICD-10-CM

## 2020-08-11 DIAGNOSIS — R519 Headache, unspecified: Secondary | ICD-10-CM | POA: Diagnosis not present

## 2020-08-11 DIAGNOSIS — G8929 Other chronic pain: Secondary | ICD-10-CM

## 2020-08-11 NOTE — Patient Instructions (Signed)
Please follow-up with neurosurgery  Continue to see your primary doctor Come see [place] as needed

## 2020-08-16 ENCOUNTER — Other Ambulatory Visit: Payer: Self-pay

## 2020-08-16 ENCOUNTER — Ambulatory Visit (INDEPENDENT_AMBULATORY_CARE_PROVIDER_SITE_OTHER): Payer: Medicare Other | Admitting: Nurse Practitioner

## 2020-08-16 ENCOUNTER — Encounter (INDEPENDENT_AMBULATORY_CARE_PROVIDER_SITE_OTHER): Payer: Self-pay | Admitting: Nurse Practitioner

## 2020-08-16 VITALS — BP 136/80 | HR 78 | Temp 97.5°F | Ht 70.0 in | Wt 234.2 lb

## 2020-08-16 DIAGNOSIS — R351 Nocturia: Secondary | ICD-10-CM

## 2020-08-16 DIAGNOSIS — R300 Dysuria: Secondary | ICD-10-CM | POA: Diagnosis not present

## 2020-08-16 DIAGNOSIS — K5900 Constipation, unspecified: Secondary | ICD-10-CM

## 2020-08-16 MED ORDER — SENNOSIDES-DOCUSATE SODIUM 8.6-50 MG PO TABS
ORAL_TABLET | ORAL | 3 refills | Status: AC
Start: 2020-08-16 — End: ?

## 2020-08-16 MED ORDER — TAMSULOSIN HCL 0.4 MG PO CAPS: 0.4000 mg | ORAL_CAPSULE | Freq: Every day | ORAL | 0 refills | Status: DC

## 2020-08-16 NOTE — Progress Notes (Signed)
Subjective:  Patient ID: Kevin Villa, male    DOB: 1955/12/07  Age: 65 y.o. MRN: [id number]  CC:  Chief Complaint  Patient presents with  . Follow-up    Doing okay, still burns when urinates  . Hypertension  . Other    Dysuria, constipation      HPI  This patient arrives today for the above.  Hypertension: We increase amlodipine to 10 mg at last office visit.  Tells me is tolerating the medication well.  Dysuria: He continues to complain of dysuria.  Last office visit we checked a UA and this was negative.  PSA is stable at 1.7.  He has seen urology in the past.  He is no longer taking his Flomax for his BPH but he tells me he ran out of it.  He used to have hematuria but this has resolved.  Constipation: He is requesting medication for constipation.  At last office visit I recommend he get senna docusate that he can take as needed, but he seems to have not picked it up from the pharmacy.  Past Medical History:  Diagnosis Date  . Arthritis    osteoarthritis   . Asthma    childhood, no inhaler, no problems as an adult  . Cervical spinal stenosis   . GERD (gastroesophageal reflux disease)    occ   . Headache    occasional  . Hematuria   . Hyperlipidemia   . Hypertension   . Pneumonia    x 1 years ago   . Pre-diabetes    no meds      Family History  Problem Relation Age of Onset  . Diabetes Mother   . Hypertension Mother   . Hypertension Father   . Cancer Brother        Unsure of type  . Hypertension Sister   . Diabetes Sister   . Hypertension Sister   . Hypertension Sister   . Hypertension Sister     Social History   Social History Narrative   Live in [place], [place]. On disability for hip pain.   Divorced, not dating.   No children.   Has friends and family.   Lives with mother or lives in his camper.   No pets.   Drinks about a 12 pack a weekend. Does not drink on a daily basis.    Does not drive b/c they expired and did not pay to get them  renewed.    4 brothers and 4 sisters.    Left-handed.   No daily use of caffeine, occasional coffee.      Social History   Tobacco Use  . Smoking status: Former Smoker    Packs/day: 0.25    Years: 30.00    Pack years: 7.50    Types: Cigarettes    Quit date: 2004    Years since quitting: 18.2  . Smokeless tobacco: Never Used  Substance Use Topics  . Alcohol use: Not Currently    Comment: occasional drinks gin or beer     Current Meds  Medication Sig  . amLODipine (NORVASC) 10 MG tablet Take 1 tablet (10 mg total) by mouth daily.  . pravastatin (PRAVACHOL) 20 MG tablet Take 1 tablet (20 mg total) by mouth every evening.  . [DISCONTINUED] senna-docusate (SENOKOT-S) 8.6-50 MG tablet Take 1 tablets by mouth twice a day as needed for constipation    ROS:  Review of Systems  Respiratory: Negative for shortness of breath.  Cardiovascular: Negative for chest pain.  Gastrointestinal: Positive for constipation. Negative for abdominal pain and blood in stool.  Genitourinary: Positive for dysuria. Negative for flank pain.       (+) nocturia     Objective:   Today's Vitals: BP 136/80   Pulse 78   Temp (!) 97.5 F (36.4 C) (Temporal)   Ht 5\' 10"  (1.778 m)   Wt 234 lb 3.2 oz (106.2 kg)   SpO2 99%   BMI 33.60 kg/m  Vitals with BMI 08/16/2020 08/11/2020 07/15/2020  Height 5\' 10"  5\' 10"  5\' 10"   Weight 234 lbs 3 oz 235 lbs 231 lbs 13 oz  BMI 33.6 33.72 33.26  Systolic 136 148 07/17/2020  Diastolic 80 82 85  Pulse 78 84 86     Physical Exam Vitals reviewed.  Constitutional:      Appearance: Normal appearance.  HENT:     Head: Normocephalic and atraumatic.  Cardiovascular:     Rate and Rhythm: Normal rate and regular rhythm.  Pulmonary:     Effort: Pulmonary effort is normal.     Breath sounds: Normal breath sounds.  Musculoskeletal:     Cervical back: Neck supple.  Skin:    General: Skin is warm and dry.  Neurological:     Mental Status: He is alert and oriented to  person, place, and time.  Psychiatric:        Mood and Affect: Mood normal.        Behavior: Behavior normal.        Thought Content: Thought content normal.        Judgment: Judgment normal.          Assessment and Plan   1. Dysuria   2. Nocturia   3. Constipation, unspecified constipation type      Plan: 1., 2.  We will check urine for chlamydia or gonorrhea infection.  We will restart tamsulosin.  If no improvement in symptoms and urine comes back negative for chlamydia gonorrhea I recommend referring him back to urology. 3.  I have again encouraged him to try getting senna-docusate that he can take as needed for constipation.  I have highlighted this on his AVS.   Tests ordered Orders Placed This Encounter  Procedures  . Chlamydia/Neisseria Gonorrhoeae RNA,TMA,Urogenital      Meds ordered this encounter  Medications  . tamsulosin (FLOMAX) 0.4 MG CAPS capsule    Sig: Take 1 capsule (0.4 mg total) by mouth daily.    Dispense:  90 capsule    Refill:  0    Order Specific Question:   Supervising Provider    Answer:   C [[id number]]  . senna-docusate (SENOKOT-S) 8.6-50 MG tablet    Sig: Take 1 tablets by mouth twice a day as needed for constipation    Dispense:  60 tablet    Refill:  3    Order Specific Question:   Supervising Provider    Answer:   [[id number]]    Patient to follow-up in 1 month or sooner as needed.  790, NP

## 2020-08-18 LAB — CHLAMYDIA/NEISSERIA GONORRHOEAE RNA,TMA,UROGENTIAL
C. trachomatis RNA, TMA: NOT DETECTED
N. gonorrhoeae RNA, TMA: NOT DETECTED

## 2020-09-08 ENCOUNTER — Ambulatory Visit (INDEPENDENT_AMBULATORY_CARE_PROVIDER_SITE_OTHER): Payer: Medicare Other | Admitting: Nurse Practitioner

## 2020-09-22 ENCOUNTER — Ambulatory Visit (INDEPENDENT_AMBULATORY_CARE_PROVIDER_SITE_OTHER): Payer: Medicare Other | Admitting: Nurse Practitioner

## 2020-09-23 ENCOUNTER — Encounter (INDEPENDENT_AMBULATORY_CARE_PROVIDER_SITE_OTHER): Payer: Self-pay | Admitting: Nurse Practitioner

## 2020-09-23 ENCOUNTER — Ambulatory Visit (INDEPENDENT_AMBULATORY_CARE_PROVIDER_SITE_OTHER): Payer: Medicare Other | Admitting: Nurse Practitioner

## 2020-10-18 ENCOUNTER — Other Ambulatory Visit: Payer: Self-pay

## 2020-10-18 ENCOUNTER — Ambulatory Visit (INDEPENDENT_AMBULATORY_CARE_PROVIDER_SITE_OTHER): Payer: Medicare Other | Admitting: Nurse Practitioner

## 2020-10-18 ENCOUNTER — Encounter: Payer: Self-pay | Admitting: *Deleted

## 2020-10-18 ENCOUNTER — Encounter (INDEPENDENT_AMBULATORY_CARE_PROVIDER_SITE_OTHER): Payer: Self-pay | Admitting: Nurse Practitioner

## 2020-10-18 VITALS — BP 138/80 | HR 91 | Temp 96.6°F | Ht 70.0 in | Wt 234.0 lb

## 2020-10-18 DIAGNOSIS — D649 Anemia, unspecified: Secondary | ICD-10-CM

## 2020-10-18 DIAGNOSIS — I10 Essential (primary) hypertension: Secondary | ICD-10-CM | POA: Diagnosis not present

## 2020-10-18 DIAGNOSIS — R300 Dysuria: Secondary | ICD-10-CM

## 2020-10-18 DIAGNOSIS — R7303 Prediabetes: Secondary | ICD-10-CM

## 2020-10-18 DIAGNOSIS — R109 Unspecified abdominal pain: Secondary | ICD-10-CM | POA: Diagnosis not present

## 2020-10-18 DIAGNOSIS — E559 Vitamin D deficiency, unspecified: Secondary | ICD-10-CM | POA: Diagnosis not present

## 2020-10-18 DIAGNOSIS — K5900 Constipation, unspecified: Secondary | ICD-10-CM

## 2020-10-18 NOTE — Progress Notes (Signed)
Subjective:  Patient ID: Kevin Villa, male    DOB: 06-24-1955  Age: 65 y.o. MRN: [id number]  CC:  Chief Complaint  Patient presents with  . Abdominal Pain  . Hypertension  . Other    Dysuria, vitamin D deficiency  . Prediabetes  . Anemia      HPI  This patient arrives today for the above.  Abdominal pain/anemia/constipation: He reports that he was experiencing intermittent lower abdominal pain that started over the weekend about 2 days ago.  He tells me the pain is sharp and intense when it occurs and will usually ease off within about 20 minutes.  He tells me having a bowel movement usually helps the pain.  He tells me he has tried MiraLAX and Senokot over-the-counter for constipation without much improvement.  He is also tried magnesium citrate over-the-counter which does usually resolve the bowel movement.  He denies any nausea, blood in stool, or black tarry stools.  He tells me his last bowel movement was about 2 days ago and he is passing gas.  He does report being very bloated.  He reports has had intermittent constipation for only the last 2 weeks.  He also has a history of microcytic anemia.  I do not see where he has been evaluated by gastroenterology for this.  I also do not believe he is on iron for this.  Hypertension: He continues on amlodipine and tolerates this well.  Dysuria: He has been experiencing dysuria and nocturia for a while now.  He also is a history of hematuria which did eventually resolve after being evaluated by urology and undergoing treatment with doxycycline.  He has been on finasteride for treatment of BPH which she stopped due to intolerable side effects.  We have started him on Flomax which she is trialing now but tells me his symptoms have not improved much.  He continues to wake up 4-5 times a night to use the bathroom.  We have tested him for gonorrhea and chlamydia which have both been negative.  Last PSA was 1.7 which was collected  approximately 3 months ago.  Vitamin D deficiency: He has a history of vitamin D deficiency but is not on any supplement currently.  Is been a while since he had a serum check of this.  Prediabetes: Last A1c was 5.6 and this was collected back in June 2019.  He is due to have this checked again today.  Past Medical History:  Diagnosis Date  . Arthritis    osteoarthritis   . Asthma    childhood, no inhaler, no problems as an adult  . Cervical spinal stenosis   . GERD (gastroesophageal reflux disease)    occ   . Headache    occasional  . Hematuria   . Hyperlipidemia   . Hypertension   . Pneumonia    x 1 years ago   . Pre-diabetes    no meds      Family History  Problem Relation Age of Onset  . Diabetes Mother   . Hypertension Mother   . Hypertension Father   . Cancer Brother        Unsure of type  . Hypertension Sister   . Diabetes Sister   . Hypertension Sister   . Hypertension Sister   . Hypertension Sister     Social History   Social History Narrative   Live in [place], [place]. On disability for hip pain.   Divorced, not dating.  No children.   Has friends and family.   Lives with mother or lives in his camper.   No pets.   Drinks about a 12 pack a weekend. Does not drink on a daily basis.    Does not drive b/c they expired and did not pay to get them renewed.    4 brothers and 4 sisters.    Left-handed.   No daily use of caffeine, occasional coffee.      Social History   Tobacco Use  . Smoking status: Former Smoker    Packs/day: 0.25    Years: 30.00    Pack years: 7.50    Types: Cigarettes    Quit date: 2004    Years since quitting: 18.4  . Smokeless tobacco: Never Used  Substance Use Topics  . Alcohol use: Not Currently    Comment: occasional drinks gin or beer     Current Meds  Medication Sig  . amLODipine (NORVASC) 10 MG tablet Take 1 tablet (10 mg total) by mouth daily.  . pravastatin (PRAVACHOL) 20 MG tablet Take 1 tablet (20 mg total) by  mouth every evening.  . senna-docusate (SENOKOT-S) 8.6-50 MG tablet Take 1 tablets by mouth twice a day as needed for constipation  . tamsulosin (FLOMAX) 0.4 MG CAPS capsule Take 1 capsule (0.4 mg total) by mouth daily.    ROS:  Review of Systems  Respiratory: Negative for shortness of breath.   Cardiovascular: Negative for chest pain.  Gastrointestinal: Positive for abdominal pain and constipation. Negative for blood in stool, melena, nausea and vomiting.  Genitourinary: Positive for dysuria and frequency. Negative for hematuria.  Neurological: Negative for headaches.     Objective:   Today's Vitals: BP 138/80   Pulse 91   Temp (!) 96.6 F (35.9 C)   Ht 5' 10"  (1.778 m)   Wt 234 lb (106.1 kg)   SpO2 96%   BMI 33.58 kg/m  Vitals with BMI 10/18/2020 08/16/2020 08/11/2020  Height 5' 10"  5' 10"  5' 10"   Weight 234 lbs 234 lbs 3 oz 235 lbs  BMI 33.58 56.3 89.37  Systolic 342 876 811  Diastolic 80 80 82  Pulse 91 78 84     Physical Exam Vitals reviewed.  Constitutional:      Appearance: Normal appearance.  HENT:     Head: Normocephalic and atraumatic.  Cardiovascular:     Rate and Rhythm: Normal rate and regular rhythm.  Pulmonary:     Effort: Pulmonary effort is normal.     Breath sounds: Normal breath sounds.  Abdominal:     General: Bowel sounds are increased. There is distension.     Palpations: Abdomen is soft. There is no hepatomegaly, splenomegaly or mass.     Tenderness: There is abdominal tenderness in the right lower quadrant and left lower quadrant.     Hernia: No hernia is present.  Musculoskeletal:     Cervical back: Neck supple.  Skin:    General: Skin is warm and dry.  Neurological:     Mental Status: He is alert and oriented to person, place, and time.  Psychiatric:        Mood and Affect: Mood normal.        Behavior: Behavior normal.        Thought Content: Thought content normal.        Judgment: Judgment normal.          Assessment and  Plan   1. Constipation, unspecified constipation type  2. Dysuria   3. Prediabetes   4. Vitamin D deficiency   5. Anemia, unspecified type   6. Abdominal pain, unspecified abdominal location   7. HTN, goal below 140/90      Plan: 1.,  5.,  6.  I think his abdominal pain is most likely related to constipation.  He was encouraged to use his magnesium citrate when he leaves the office today, but I will refer him to gastroenterology for further evaluation since the constipations onset is only been the last 2 weeks and seems to be pretty moderate to severe and is causing him quite a bit of discomfort.  Regarding his dysuria we will also refer him to a different urologist for second opinion per his request for further evaluation management.  We will check CBC, ferritin, and iron as well today to monitor his anemia. 2.  We will refer him to urology for second opinion regarding his dysuria. 3.  We will check A1c today. 4.  We will check serum vitamin D level. 7.  Blood pressure reasonably controlled on current regimen she will continue taking medication as prescribed.   I did have Dr. Anastasio Champion come into the room today and evaluate/perform physical exam on the patient as well.  He is agreeable with the above plan regarding the patient's abdominal pain and constipation.  Tests ordered Orders Placed This Encounter  Procedures  . Hemoglobin A1c  . CMP with eGFR([facility])  . Vitamin D, 25-hydroxy  . CBC with Differential/Platelets  . Iron  . Ferritin  . Ambulatory referral to Urology  . Ambulatory referral to Gastroenterology      No orders of the defined types were placed in this encounter.   Patient to follow-up in 6 to 8 weeks or sooner as needed.  Ailene Ards, NP

## 2020-10-19 ENCOUNTER — Encounter (INDEPENDENT_AMBULATORY_CARE_PROVIDER_SITE_OTHER): Payer: Self-pay | Admitting: Nurse Practitioner

## 2020-10-19 DIAGNOSIS — D509 Iron deficiency anemia, unspecified: Secondary | ICD-10-CM

## 2020-10-19 LAB — COMPLETE METABOLIC PANEL WITH GFR
AG Ratio: 1.3 (calc) (ref 1.0–2.5)
ALT: 19 U/L (ref 9–46)
AST: 26 U/L (ref 10–35)
Albumin: 4.2 g/dL (ref 3.6–5.1)
Alkaline phosphatase (APISO): 75 U/L (ref 35–144)
BUN: 18 mg/dL (ref 7–25)
CO2: 29 mmol/L (ref 20–32)
Calcium: 10 mg/dL (ref 8.6–10.3)
Chloride: 101 mmol/L (ref 98–110)
Creat: 1.02 mg/dL (ref 0.70–1.25)
GFR, Est African American: 90 mL/min/{1.73_m2} (ref >=60)
GFR, Est Non African American: 78 mL/min/{1.73_m2} (ref >=60)
Globulin: 3.2 g/dL (calc) (ref 1.9–3.7)
Glucose, Bld: 99 mg/dL (ref 65–139)
Potassium: 4.4 mmol/L (ref 3.5–5.3)
Sodium: 139 mmol/L (ref 135–146)
Total Bilirubin: 0.4 mg/dL (ref 0.2–1.2)
Total Protein: 7.4 g/dL (ref 6.1–8.1)

## 2020-10-19 LAB — CBC WITH DIFFERENTIAL/PLATELET
Absolute Monocytes: 440 cells/uL (ref 200–950)
Basophils Absolute: 43 cells/uL (ref 0–200)
Basophils Relative: 0.6 %
Eosinophils Absolute: 92 cells/uL (ref 15–500)
Eosinophils Relative: 1.3 %
HCT: 37.5 % — ABNORMAL LOW (ref 38.5–50.0)
Hemoglobin: 12 g/dL — ABNORMAL LOW (ref 13.2–17.1)
Lymphs Abs: 2215 cells/uL (ref 850–3900)
MCH: 25.3 pg — ABNORMAL LOW (ref 27.0–33.0)
MCHC: 32 g/dL (ref 32.0–36.0)
MCV: 79.1 fL — ABNORMAL LOW (ref 80.0–100.0)
MPV: 11.4 fL (ref 7.5–12.5)
Monocytes Relative: 6.2 %
Neutro Abs: 4310 cells/uL (ref 1500–7800)
Neutrophils Relative %: 60.7 %
Platelets: 195 10*3/uL (ref 140–400)
RBC: 4.74 10*6/uL (ref 4.20–5.80)
RDW: 15.1 % — ABNORMAL HIGH (ref 11.0–15.0)
Total Lymphocyte: 31.2 %
WBC: 7.1 10*3/uL (ref 3.8–10.8)

## 2020-10-19 LAB — HEMOGLOBIN A1C
Hgb A1c MFr Bld: 6 % of total Hgb — ABNORMAL HIGH (ref <5.7)
Mean Plasma Glucose: 126 mg/dL
eAG (mmol/L): 7 mmol/L

## 2020-10-19 LAB — IRON: Iron: 65 ug/dL (ref 50–180)

## 2020-10-19 LAB — FERRITIN: Ferritin: 51 ng/mL (ref 24–380)

## 2020-10-19 LAB — VITAMIN D 25 HYDROXY (VIT D DEFICIENCY, FRACTURES): Vit D, 25-Hydroxy: 14 ng/mL — ABNORMAL LOW (ref 30–100)

## 2020-10-20 ENCOUNTER — Ambulatory Visit (INDEPENDENT_AMBULATORY_CARE_PROVIDER_SITE_OTHER): Payer: Medicare Other | Admitting: Nurse Practitioner

## 2020-11-04 ENCOUNTER — Encounter (HOSPITAL_COMMUNITY): Payer: Self-pay | Admitting: Hematology and Oncology

## 2020-11-04 ENCOUNTER — Other Ambulatory Visit: Payer: Self-pay

## 2020-11-05 ENCOUNTER — Inpatient Hospital Stay (HOSPITAL_COMMUNITY): Payer: Medicare Other

## 2020-11-05 ENCOUNTER — Inpatient Hospital Stay (HOSPITAL_COMMUNITY): Payer: Medicare Other | Attending: Hematology and Oncology | Admitting: Hematology and Oncology

## 2020-11-05 ENCOUNTER — Encounter (HOSPITAL_COMMUNITY): Payer: Self-pay | Admitting: Hematology and Oncology

## 2020-11-05 VITALS — BP 146/80 | HR 78 | Temp 96.4°F | Resp 16 | Ht 70.0 in | Wt 231.4 lb

## 2020-11-05 DIAGNOSIS — Z87891 Personal history of nicotine dependence: Secondary | ICD-10-CM | POA: Insufficient documentation

## 2020-11-05 DIAGNOSIS — R197 Diarrhea, unspecified: Secondary | ICD-10-CM | POA: Insufficient documentation

## 2020-11-05 DIAGNOSIS — E785 Hyperlipidemia, unspecified: Secondary | ICD-10-CM | POA: Diagnosis not present

## 2020-11-05 DIAGNOSIS — Z79899 Other long term (current) drug therapy: Secondary | ICD-10-CM | POA: Diagnosis not present

## 2020-11-05 DIAGNOSIS — R319 Hematuria, unspecified: Secondary | ICD-10-CM | POA: Diagnosis not present

## 2020-11-05 DIAGNOSIS — Z8249 Family history of ischemic heart disease and other diseases of the circulatory system: Secondary | ICD-10-CM | POA: Insufficient documentation

## 2020-11-05 DIAGNOSIS — Z7289 Other problems related to lifestyle: Secondary | ICD-10-CM | POA: Diagnosis not present

## 2020-11-05 DIAGNOSIS — K5900 Constipation, unspecified: Secondary | ICD-10-CM | POA: Insufficient documentation

## 2020-11-05 DIAGNOSIS — I10 Essential (primary) hypertension: Secondary | ICD-10-CM | POA: Diagnosis not present

## 2020-11-05 DIAGNOSIS — D649 Anemia, unspecified: Secondary | ICD-10-CM | POA: Diagnosis not present

## 2020-11-05 DIAGNOSIS — Z809 Family history of malignant neoplasm, unspecified: Secondary | ICD-10-CM | POA: Insufficient documentation

## 2020-11-05 DIAGNOSIS — R300 Dysuria: Secondary | ICD-10-CM | POA: Diagnosis not present

## 2020-11-05 DIAGNOSIS — Z833 Family history of diabetes mellitus: Secondary | ICD-10-CM | POA: Diagnosis not present

## 2020-11-05 DIAGNOSIS — R21 Rash and other nonspecific skin eruption: Secondary | ICD-10-CM | POA: Insufficient documentation

## 2020-11-05 LAB — CMP (CANCER CENTER ONLY)
ALT: 23 U/L (ref 0–44)
AST: 27 U/L (ref 15–41)
Albumin: 4.1 g/dL (ref 3.5–5.0)
Alkaline Phosphatase: 68 U/L (ref 38–126)
Anion gap: 8 (ref 5–15)
BUN: 19 mg/dL (ref 8–23)
CO2: 28 mmol/L (ref 22–32)
Calcium: 9.5 mg/dL (ref 8.9–10.3)
Chloride: 102 mmol/L (ref 98–111)
Creatinine: 0.99 mg/dL (ref 0.61–1.24)
GFR, Estimated: >60 mL/min (ref >60)
Glucose, Bld: 107 mg/dL — ABNORMAL HIGH (ref 70–99)
Potassium: 4.2 mmol/L (ref 3.5–5.1)
Sodium: 138 mmol/L (ref 135–145)
Total Bilirubin: 0.4 mg/dL (ref 0.3–1.2)
Total Protein: 7.7 g/dL (ref 6.5–8.1)

## 2020-11-05 LAB — CBC WITH DIFFERENTIAL/PLATELET
Abs Immature Granulocytes: 0.03 10*3/uL (ref 0.00–0.07)
Basophils Absolute: 0 10*3/uL (ref 0.0–0.1)
Basophils Relative: 1 %
Eosinophils Absolute: 0.1 10*3/uL (ref 0.0–0.5)
Eosinophils Relative: 1 %
HCT: 41 % (ref 39.0–52.0)
Hemoglobin: 12.5 g/dL — ABNORMAL LOW (ref 13.0–17.0)
Immature Granulocytes: 0 %
Lymphocytes Relative: 36 %
Lymphs Abs: 2.4 10*3/uL (ref 0.7–4.0)
MCH: 25.8 pg — ABNORMAL LOW (ref 26.0–34.0)
MCHC: 30.5 g/dL (ref 30.0–36.0)
MCV: 84.5 fL (ref 80.0–100.0)
Monocytes Absolute: 0.5 10*3/uL (ref 0.1–1.0)
Monocytes Relative: 7 %
Neutro Abs: 3.8 10*3/uL (ref 1.7–7.7)
Neutrophils Relative %: 55 %
Platelets: 249 10*3/uL (ref 150–400)
RBC: 4.85 MIL/uL (ref 4.22–5.81)
RDW: 14.7 % (ref 11.5–15.5)
WBC: 6.9 10*3/uL (ref 4.0–10.5)
nRBC: 0 % (ref 0.0–0.2)

## 2020-11-05 LAB — LACTATE DEHYDROGENASE: LDH: 128 U/L (ref 98–192)

## 2020-11-05 LAB — RETICULOCYTES
Immature Retic Fract: 16.7 % — ABNORMAL HIGH (ref 2.3–15.9)
RBC.: 4.86 MIL/uL (ref 4.22–5.81)
Retic Count, Absolute: 46.7 10*3/uL (ref 19.0–186.0)
Retic Ct Pct: 1 % (ref 0.4–3.1)

## 2020-11-05 NOTE — Progress Notes (Signed)
[facility] CONSULT NOTE  Patient Care Team: Wilson Singer, MD as PCP - General (Internal Medicine) Jena Gauss Gerrit Friends, MD as Consulting Physician (Gastroenterology)  CHIEF COMPLAINTS/PURPOSE OF CONSULTATION:  ANEMIA  ASSESSMENT & PLAN:  This is a very pleasant 65 yr old male patient referred to hematology for evaluation of anemia. We have discussed about further evaluation including Hb electrophoresis, evaluation for hemolysis, CBC, CMP today. His anemia has relatively stayed the same in the past 2 yrs. Given stable anemia, if above mentioned evaluation is negative, I would recommend gastroenterology evaluation and surveillance. He will RTC in 2 weeks for review of labs and further recommendations.  HISTORY OF PRESENTING ILLNESS:  Kevin Villa 65 y.o. male is here because of anemia.  Kevin Villa is 65 yr old male patient who presented with chief complaint of anemia. He denies any complaints except for recent change in bowel habits He has been noticing a change in his bowel habits, alternating constipation and diarrhea for the past 2 months, feels bloated. He never had this issue anymore. He denies any hematochezia or melena. He has been referred to GI.  No dietary restrictions.No known nutritional deficiencies. He had occasional hematuria, evaluated urology. No other B symptoms, No weight loss. Rest of the pertinent 10 point ROS reviewed and negative.  MEDICAL HISTORY:  Past Medical History:  Diagnosis Date   Arthritis    osteoarthritis    Asthma    childhood, no inhaler, no problems as an adult   Cervical spinal stenosis    GERD (gastroesophageal reflux disease)    occ    Headache    occasional   Hematuria    Hyperlipidemia    Hypertension    Pneumonia    x 1 years ago    Pre-diabetes    no meds    SURGICAL HISTORY: Past Surgical History:  Procedure Laterality Date   ANTERIOR CERVICAL DECOMP/DISCECTOMY FUSION N/A 06/07/2020   Procedure:  ANTERIOR CERVICAL DECOMPRESSION AND FUSION CERVICAL THREE-FOUR, CERVICAL FOUR-FIVE.;  Surgeon: Donalee Citrin, MD;  Location: [facility] OR;  Service: Neurosurgery;  Laterality: N/A;  anterior   HERNIA REPAIR     right hand surgery     pinky finger    TOTAL HIP ARTHROPLASTY Right 11/06/2017   Procedure: RIGHT TOTAL HIP ARTHROPLASTY ANTERIOR APPROACH;  Surgeon: Durene Romans, MD;  Location: [facility] ORS;  Service: Orthopedics;  Laterality: Right;  70 mins    SOCIAL HISTORY: Social History   Socioeconomic History   Marital status: Divorced    Spouse name: Not on file   Number of children: 0   Years of education: 11 grade   Highest education level: 11th grade  Occupational History   Occupation: disability  Tobacco Use   Smoking status: Former    Packs/day: 0.25    Years: 30.00    Pack years: 7.50    Types: Cigarettes    Quit date: 2004    Years since quitting: 18.4   Smokeless tobacco: Never  Vaping Use   Vaping Use: Never used  Substance and Sexual Activity   Alcohol use: Not Currently    Alcohol/week: 8.0 standard drinks    Types: 8 Cans of beer per week    Comment: occasional drinks gin or beer   Drug use: No   Sexual activity: Never    Partners: Female  Other Topics Concern   Not on file  Social History Narrative   Live in [place], [place]. On disability for hip pain.  Divorced, not dating.   No children.   Has friends and family.   Lives with mother or lives in his camper.   No pets.   Drinks about a 12 pack a weekend. Does not drink on a daily basis.    Does not drive b/c they expired and did not pay to get them renewed.    4 brothers and 4 sisters.    Left-handed.   No daily use of caffeine, occasional coffee.      Social Determinants of Health   Financial Resource Strain: Medium Risk   Difficulty of Paying Living Expenses: Somewhat hard  Food Insecurity: No Food Insecurity   Worried About Programme researcher, broadcasting/film/video in the Last Year: Never true   Ran Out of Food in the Last Year: Never  true  Transportation Needs: No Transportation Needs   Lack of Transportation (Medical): No   Lack of Transportation (Non-Medical): No  Physical Activity: Insufficiently Active   Days of Exercise per Week: 7 days   Minutes of Exercise per Session: 20 min  Stress: No Stress Concern Present   Feeling of Stress : Not at all  Social Connections: Moderately Isolated   Frequency of Communication with Friends and Family: More than three times a week   Frequency of Social Gatherings with Friends and Family: More than three times a week   Attends Religious Services: More than 4 times per year   Active Member of [employer] or Organizations: No   Attends Engineer, structural: Never   Marital Status: Divorced  Catering manager Violence: Not At Risk   Fear of Current or Ex-Partner: No   Emotionally Abused: No   Physically Abused: No   Sexually Abused: No    FAMILY HISTORY: Family History  Problem Relation Age of Onset   Diabetes Mother    Hypertension Mother    Hypertension Father    Cancer Brother        Unsure of type   Hypertension Sister    Diabetes Sister    Hypertension Sister    Hypertension Sister    Hypertension Sister     ALLERGIES:  is allergic to peach flavor, peanut-containing drug products, and tomato.  MEDICATIONS:  Current Outpatient Medications  Medication Sig Dispense Refill   amLODipine (NORVASC) 10 MG tablet Take 1 tablet (10 mg total) by mouth daily. 90 tablet 0   Cholecalciferol (VITAMIN D3) 50 MCG (2000 UT) CAPS Take 1 capsule by mouth daily.     pravastatin (PRAVACHOL) 20 MG tablet Take 1 tablet (20 mg total) by mouth every evening. 90 tablet 1   senna-docusate (SENOKOT-S) 8.6-50 MG tablet Take 1 tablets by mouth twice a day as needed for constipation 60 tablet 3   tamsulosin (FLOMAX) 0.4 MG CAPS capsule Take 1 capsule (0.4 mg total) by mouth daily. 90 capsule 0   No current facility-administered medications for this visit.    PHYSICAL  EXAMINATION: ECOG PERFORMANCE STATUS: 0 - Asymptomatic  Vitals:   11/05/20 1255  BP: (!) 146/80  Pulse: 78  Resp: 16  Temp: (!) 96.4 F (35.8 C)  SpO2: 98%   Filed Weights   11/05/20 1255  Weight: 231 lb 6.4 oz (105 kg)    GENERAL:alert, no distress and comfortable SKIN: skin color, texture, turgor are normal, no rashes or significant lesions EYES: normal, conjunctiva are pink and non-injected, sclera clear OROPHARYNX:no exudate, no erythema and lips, buccal mucosa, and tongue normal  NECK: supple, thyroid normal size, non-tender, without nodularity  LYMPH:  no palpable lymphadenopathy in the cervical, axillary or inguinal LUNGS: clear to auscultation and percussion with normal breathing effort HEART: regular rate & rhythm and no murmurs and no lower extremity edema ABDOMEN: bloated, no organomegaly. Musculoskeletal:no cyanosis of digits and no clubbing  PSYCH: alert & oriented x 3 with fluent speech NEURO: no focal motor/sensory deficits  LABORATORY DATA:  I have reviewed the data as listed Lab Results  Component Value Date   WBC 7.1 10/18/2020   HGB 12.0 (L) 10/18/2020   HCT 37.5 (L) 10/18/2020   MCV 79.1 (L) 10/18/2020   PLT 195 10/18/2020     Chemistry      Component Value Date/Time   NA 139 10/18/2020 1207   K 4.4 10/18/2020 1207   CL 101 10/18/2020 1207   CO2 29 10/18/2020 1207   BUN 18 10/18/2020 1207   CREATININE 1.02 10/18/2020 1207      Component Value Date/Time   CALCIUM 10.0 10/18/2020 1207   ALKPHOS 74 06/11/2015 0215   AST 26 10/18/2020 1207   ALT 19 10/18/2020 1207   BILITOT 0.4 10/18/2020 1207      RADIOGRAPHIC STUDIES: I have personally reviewed the radiological images as listed and agreed with the findings in the report. No results found.  All questions were answered. The patient knows to call the clinic with any problems, questions or concerns. I spent 30 minutes in the care of this patient including H and P, review of records,  counseling and coordination of care. We have reviewed causes of anemia, lab investigation, gastroenterology evaluation, labs needed and follow up recommendations.    Rachel Moulds, MD 11/05/2020 1:21 PM

## 2020-11-08 LAB — PROTEIN ELECTROPHORESIS, SERUM
A/G Ratio: 1.4 (ref 0.7–1.7)
Albumin ELP: 4.2 g/dL (ref 2.9–4.4)
Alpha-1-Globulin: 0.2 g/dL (ref 0.0–0.4)
Alpha-2-Globulin: 0.8 g/dL (ref 0.4–1.0)
Beta Globulin: 1 g/dL (ref 0.7–1.3)
Gamma Globulin: 1 g/dL (ref 0.4–1.8)
Globulin, Total: 3.1 g/dL (ref 2.2–3.9)
Total Protein ELP: 7.3 g/dL (ref 6.0–8.5)

## 2020-11-08 LAB — KAPPA/LAMBDA LIGHT CHAINS
Kappa free light chain: 23.4 mg/L — ABNORMAL HIGH (ref 3.3–19.4)
Kappa, lambda light chain ratio: 1.11 (ref 0.26–1.65)
Lambda free light chains: 21.1 mg/L (ref 5.7–26.3)

## 2020-11-08 LAB — HGB FRACTIONATION CASCADE
Hgb A2: 2.3 % (ref 1.8–3.2)
Hgb A: 97.7 % (ref 96.4–98.8)
Hgb F: 0 % (ref 0.0–2.0)
Hgb S: 0 %

## 2020-11-22 NOTE — Progress Notes (Signed)
Kevin Villa, Kevin Villa [address] [place], [place] [address]   CLINIC:  Medical Oncology/Hematology  PCP:  Kevin Singer, MD [address] [place] [address] [phone]   REASON FOR VISIT:  Follow-up for normocytic anemia  PRIOR THERAPY: None  CURRENT THERAPY: Under work-up  INTERVAL HISTORY:  Kevin Villa 65 y.o. male returns for routine follow-up of anemia.  He was seen by Dr. Al Pimple for initial work-up on 11/05/2020.  He returns today to discuss results of work-up initiated at that time.  Kevin Villa continues to report that he is doing relatively well.  Patient has had intermittent hematuria.  Reports stinging dysuria and intermittent hematuria for the past 6 to 7 months.  Denies massive hematuria but reports that he sees "blood spots" in his underwear and sometimes passes blood clots with urination.  He denies other symptoms of blood loss such as hematochezia or melena.  He denies B symptoms such as fever, chills, night sweats, unintentional weight loss. No chest pain, shortness of breath on exertion, palpitations, syncope.  He has 100% energy and 100% appetite. He endorses that he is maintaining a stable weight.    REVIEW OF SYSTEMS:  Review of Systems  Constitutional:  Negative for appetite change, chills, diaphoresis, fever and unexpected weight change.  HENT:   Negative for lump/mass and nosebleeds.   Eyes:  Negative for eye problems.  Respiratory:  Negative for cough, hemoptysis and shortness of breath.   Cardiovascular:  Negative for chest pain, leg swelling and palpitations.  Gastrointestinal:  Negative for abdominal pain, blood in stool, constipation, diarrhea, nausea and vomiting.  Genitourinary:  Positive for dysuria and hematuria.   Skin: Negative.   Neurological:  Negative for dizziness, headaches and light-headedness.  Hematological:  Does not bruise/bleed easily.     PAST MEDICAL/SURGICAL HISTORY:  Past Medical History:   Diagnosis Date   Arthritis    osteoarthritis    Asthma    childhood, no inhaler, no problems as an adult   Cervical spinal stenosis    GERD (gastroesophageal reflux disease)    occ    Headache    occasional   Hematuria    Hyperlipidemia    Hypertension    Pneumonia    x 1 years ago    Pre-diabetes    no meds   Past Surgical History:  Procedure Laterality Date   ANTERIOR CERVICAL DECOMP/DISCECTOMY FUSION N/A 06/07/2020   Procedure: ANTERIOR CERVICAL DECOMPRESSION AND FUSION CERVICAL THREE-FOUR, CERVICAL FOUR-FIVE.;  Surgeon: Donalee Citrin, MD;  Location: [facility];  Service: Neurosurgery;  Laterality: N/A;  anterior   HERNIA REPAIR     right hand surgery     pinky finger    TOTAL HIP ARTHROPLASTY Right 11/06/2017   Procedure: RIGHT TOTAL HIP ARTHROPLASTY ANTERIOR APPROACH;  Surgeon: Durene Romans, MD;  Location: [facility];  Service: Orthopedics;  Laterality: Right;  70 mins     SOCIAL HISTORY:  Social History   Socioeconomic History   Marital status: Divorced    Spouse name: Not on file   Number of children: 0   Years of education: 11 grade   Highest education level: 11th grade  Occupational History   Occupation: disability  Tobacco Use   Smoking status: Former    Packs/day: 0.25    Years: 30.00    Pack years: 7.50    Types: Cigarettes    Quit date: 2004    Years since quitting: 18.4   Smokeless tobacco: Never  Vaping Use  Vaping Use: Never used  Substance and Sexual Activity   Alcohol use: Not Currently    Alcohol/week: 8.0 standard drinks    Types: 8 Cans of beer per week    Comment: occasional drinks gin or beer   Drug use: No   Sexual activity: Never    Partners: Female  Other Topics Concern   Not on file  Social History Narrative   Live in [place], [place]. On disability for hip pain.   Divorced, not dating.   No children.   Has friends and family.   Lives with mother or lives in his camper.   No pets.   Drinks about a 12 pack a weekend. Does not drink on a  daily basis.    Does not drive b/c they expired and did not pay to get them renewed.    4 brothers and 4 sisters.    Left-handed.   No daily use of caffeine, occasional coffee.      Social Determinants of Health   Financial Resource Strain: Medium Risk   Difficulty of Paying Living Expenses: Somewhat hard  Food Insecurity: No Food Insecurity   Worried About Programme researcher, broadcasting/film/video in the Last Year: Never true   Ran Out of Food in the Last Year: Never true  Transportation Needs: No Transportation Needs   Lack of Transportation (Medical): No   Lack of Transportation (Non-Medical): No  Physical Activity: Insufficiently Active   Days of Exercise per Week: 7 days   Minutes of Exercise per Session: 20 min  Stress: No Stress Concern Present   Feeling of Stress : Not at all  Social Connections: Moderately Isolated   Frequency of Communication with Friends and Family: More than three times a week   Frequency of Social Gatherings with Friends and Family: More than three times a week   Attends Religious Services: More than 4 times per year   Active Member of [employer] or Organizations: No   Attends Engineer, structural: Never   Marital Status: Divorced  Catering manager Violence: Not At Risk   Fear of Current or Ex-Partner: No   Emotionally Abused: No   Physically Abused: No   Sexually Abused: No    FAMILY HISTORY:  Family History  Problem Relation Age of Onset   Diabetes Mother    Hypertension Mother    Hypertension Father    Cancer Brother        Unsure of type   Hypertension Sister    Diabetes Sister    Hypertension Sister    Hypertension Sister    Hypertension Sister     CURRENT MEDICATIONS:  Outpatient Encounter Medications as of 11/23/2020  Medication Sig   amLODipine (NORVASC) 10 MG tablet Take 1 tablet (10 mg total) by mouth daily.   Cholecalciferol (VITAMIN D3) 50 MCG (2000 UT) CAPS Take 1 capsule by mouth daily.   pravastatin (PRAVACHOL) 20 MG tablet Take 1  tablet (20 mg total) by mouth every evening.   senna-docusate (SENOKOT-S) 8.6-50 MG tablet Take 1 tablets by mouth twice a day as needed for constipation   tamsulosin (FLOMAX) 0.4 MG CAPS capsule Take 1 capsule (0.4 mg total) by mouth daily.   No facility-administered encounter medications on file as of 11/23/2020.    ALLERGIES:  Allergies  Allergen Reactions   Peach Flavor Hives   Peanut-Containing Drug Products Other (See Comments)    Peanuts make mouth raw   Tomato Hives and Itching     PHYSICAL EXAM:  ECOG PERFORMANCE STATUS: 0 - Asymptomatic  There were no vitals filed for this visit. There were no vitals filed for this visit. Physical Exam Constitutional:      Appearance: Normal appearance.  HENT:     Head: Normocephalic and atraumatic.     Mouth/Throat:     Mouth: Mucous membranes are moist.  Eyes:     Extraocular Movements: Extraocular movements intact.     Pupils: Pupils are equal, round, and reactive to light.  Cardiovascular:     Rate and Rhythm: Normal rate and regular rhythm.     Pulses: Normal pulses.     Heart sounds: Normal heart sounds.  Pulmonary:     Effort: Pulmonary effort is normal.     Breath sounds: Normal breath sounds.  Abdominal:     General: Bowel sounds are normal.     Palpations: Abdomen is soft.     Tenderness: There is no abdominal tenderness.  Musculoskeletal:        General: No swelling.     Right lower leg: No edema.     Left lower leg: No edema.  Lymphadenopathy:     Cervical: No cervical adenopathy.  Skin:    General: Skin is warm and dry.  Neurological:     General: No focal deficit present.     Mental Status: He is alert and oriented to person, place, and time.  Psychiatric:        Mood and Affect: Mood normal.        Behavior: Behavior normal.     LABORATORY DATA:  I have reviewed the labs as listed.  CBC    Component Value Date/Time   WBC 6.9 11/05/2020 1403   RBC 4.85 11/05/2020 1403   RBC 4.86 11/05/2020 1403    HGB 12.5 (L) 11/05/2020 1403   HCT 41.0 11/05/2020 1403   PLT 249 11/05/2020 1403   MCV 84.5 11/05/2020 1403   MCH 25.8 (L) 11/05/2020 1403   MCHC 30.5 11/05/2020 1403   RDW 14.7 11/05/2020 1403   LYMPHSABS 2.4 11/05/2020 1403   MONOABS 0.5 11/05/2020 1403   EOSABS 0.1 11/05/2020 1403   BASOSABS 0.0 11/05/2020 1403   CMP Latest Ref Rng & Units 11/05/2020 10/18/2020 07/15/2020  Glucose 70 - 99 mg/dL 606(T) 99 016  BUN 8 - 23 mg/dL 19 18 14   Creatinine 0.61 - 1.24 mg/dL 0.10 9.32  Sodium 135 - 145 mmol/L 138 139 141  Potassium 3.5 - 5.1 mmol/L 4.2 4.4 4.2  Chloride 98 - 111 mmol/L 102 101 103  CO2 22 - 32 mmol/L 28 29 31   Calcium 8.9 - 10.3 mg/dL 9.5 3.55 9.6  Total Protein 6.5 - 8.1 g/dL 7.7 7.4 6.9  Total Bilirubin 0.3 - 1.2 mg/dL 0.4 0.4 0.4  Alkaline Phos 38 - 126 U/L 68 - -  AST 15 - 41 U/L 27 26 21   ALT 0 - 44 U/L 23 19 22     DIAGNOSTIC IMAGING:  I have independently reviewed the relevant imaging and discussed with the patient.  ASSESSMENT & PLAN: 1.  Normocytic anemia - Hgb ranging from baseline of about 11.5 - 12.5 since 2017, with primarily normocytic MCV - Hematology work-up significant for the following: Reticulocytes 1.0%, indicating reticulocyte index of 0.89 (hypoproliferation) Ferritin borderline low at 51 with serum iron 65 SPEP, LDH, hemoglobin electrophoresis, free light chains, creatinine unremarkable (hemoglobin electrophoresis, evaluation for hemolysis, CBC, CMP) -Patient has never had EGD or colonoscopy, denies gross rectal hemorrhage or melena - Reports  intermittent hematuria for the past 6 to 7 months, has not yet been worked up for this by urology (previously saw urologist for BPH but requests referral to a different urologist)  - Differential diagnosis favors mild iron deficiency anemia, possibly secondary to malabsorption versus slow chronic blood loss - PLAN: Start patient on ferrous sulfate 325 mg daily.  Check stool cards x3 to rule out  occult GI bleed.  Referral to urology for hematuria.  RTC in 3 months with repeat labs.   PLAN SUMMARY & DISPOSITION: - Start daily iron supplement - Referral to urology - Stool cards x3 - RTC in 3 months with repeat labs  All questions were answered. The patient knows to call the clinic with any problems, questions or concerns.  Medical decision making: Low  Time spent on visit: I spent 15 minutes counseling the patient face to face. The total time spent in the appointment was 25 minutes and more than 50% was on counseling.   Carnella GuadalajaraRebekah M Conor Filsaime, PA-C  11/23/20 2:27 PM

## 2020-11-23 ENCOUNTER — Inpatient Hospital Stay (HOSPITAL_BASED_OUTPATIENT_CLINIC_OR_DEPARTMENT_OTHER): Payer: Medicare Other | Admitting: Physician Assistant

## 2020-11-23 ENCOUNTER — Other Ambulatory Visit: Payer: Self-pay

## 2020-11-23 ENCOUNTER — Encounter (HOSPITAL_COMMUNITY): Payer: Self-pay | Admitting: Physician Assistant

## 2020-11-23 VITALS — BP 131/72 | HR 86 | Temp 96.7°F | Resp 20 | Wt 232.4 lb

## 2020-11-23 DIAGNOSIS — K5900 Constipation, unspecified: Secondary | ICD-10-CM | POA: Diagnosis not present

## 2020-11-23 DIAGNOSIS — Z8249 Family history of ischemic heart disease and other diseases of the circulatory system: Secondary | ICD-10-CM | POA: Diagnosis not present

## 2020-11-23 DIAGNOSIS — Z833 Family history of diabetes mellitus: Secondary | ICD-10-CM | POA: Diagnosis not present

## 2020-11-23 DIAGNOSIS — Z809 Family history of malignant neoplasm, unspecified: Secondary | ICD-10-CM | POA: Diagnosis not present

## 2020-11-23 DIAGNOSIS — R319 Hematuria, unspecified: Secondary | ICD-10-CM | POA: Diagnosis not present

## 2020-11-23 DIAGNOSIS — R300 Dysuria: Secondary | ICD-10-CM | POA: Diagnosis not present

## 2020-11-23 DIAGNOSIS — D509 Iron deficiency anemia, unspecified: Secondary | ICD-10-CM | POA: Diagnosis not present

## 2020-11-23 DIAGNOSIS — D649 Anemia, unspecified: Secondary | ICD-10-CM | POA: Diagnosis not present

## 2020-11-23 DIAGNOSIS — I10 Essential (primary) hypertension: Secondary | ICD-10-CM | POA: Diagnosis not present

## 2020-11-23 DIAGNOSIS — Z7289 Other problems related to lifestyle: Secondary | ICD-10-CM | POA: Diagnosis not present

## 2020-11-23 DIAGNOSIS — E785 Hyperlipidemia, unspecified: Secondary | ICD-10-CM | POA: Diagnosis not present

## 2020-11-23 DIAGNOSIS — R21 Rash and other nonspecific skin eruption: Secondary | ICD-10-CM | POA: Diagnosis not present

## 2020-11-23 DIAGNOSIS — Z87891 Personal history of nicotine dependence: Secondary | ICD-10-CM | POA: Diagnosis not present

## 2020-11-23 DIAGNOSIS — Z79899 Other long term (current) drug therapy: Secondary | ICD-10-CM | POA: Diagnosis not present

## 2020-11-23 DIAGNOSIS — R197 Diarrhea, unspecified: Secondary | ICD-10-CM | POA: Diagnosis not present

## 2020-11-23 MED ORDER — FERROUS SULFATE 325 (65 FE) MG PO TBEC: 325.0000 mg | DELAYED_RELEASE_TABLET | Freq: Every day | ORAL | 5 refills | Status: DC

## 2020-11-23 NOTE — Patient Instructions (Signed)
[facility] at [facility] Discharge Instructions  You were seen today by Rojelio Brenner PA-C for your anemia (low blood counts).  Your low blood count and low iron is likely due to the blood in your urine.  We will refer you to see a urologist to find out why you have blood in your urine.  You will also need to start taking an iron pill once a day to help your body make more blood cells.  LABS: Return in 3 months for repeat labs  OTHER TESTS: Check stool cards and return to the lab at [facility] ASAP  MEDICATIONS: START taking iron supplement (ferrous sulfate 325 mg) once a day.  If this causes constipation, you can use Colace or other stool softener of your choice to help you have a good bowel movement.  FOLLOW-UP APPOINTMENT: - Follow-up visit at our clinic (hematology) in 3 months - Referral has been sent to urology   Thank you for choosing [facility] at [facility] to provide your oncology and hematology care.  To afford each patient quality time with our provider, please arrive at least 15 minutes before your scheduled appointment time.   If you have a lab appointment with the Cancer Center please come in thru the Main Entrance and check in at the main information desk.  You need to re-schedule your appointment should you arrive 10 or more minutes late.  We strive to give you quality time with our providers, and arriving late affects you and other patients whose appointments are after yours.  Also, if you no show three or more times for appointments you may be dismissed from the clinic at the providers discretion.     Again, thank you for choosing [facility].  Our hope is that these requests will decrease the amount of time that you wait before being seen by our physicians.       _____________________________________________________________  Should you have questions after your visit to [facility],  please contact our office at [phone] and follow the prompts.  Our office hours are 8:00 a.m. and 4:30 p.m. Monday - Friday.  Please note that voicemails left after 4:00 p.m. may not be returned until the following business day.  We are closed weekends and major holidays.  You do have access to a nurse 24-7, just call the main number to the clinic [phone] and do not press any options, hold on the line and a nurse will answer the phone.    For prescription refill requests, have your pharmacy contact our office and allow 72 hours.    Due to Covid, you will need to wear a mask upon entering the hospital. If you do not have a mask, a mask will be given to you at the Main Entrance upon arrival. For doctor visits, patients may have 1 support person age 63 or older with them. For treatment visits, patients can not have anyone with them due to social distancing guidelines and our immunocompromised population.

## 2020-12-09 ENCOUNTER — Ambulatory Visit (INDEPENDENT_AMBULATORY_CARE_PROVIDER_SITE_OTHER): Payer: Medicare Other | Admitting: Internal Medicine

## 2020-12-14 ENCOUNTER — Encounter (INDEPENDENT_AMBULATORY_CARE_PROVIDER_SITE_OTHER): Payer: Self-pay | Admitting: Internal Medicine

## 2020-12-14 ENCOUNTER — Ambulatory Visit (INDEPENDENT_AMBULATORY_CARE_PROVIDER_SITE_OTHER): Payer: Medicare Other | Admitting: Internal Medicine

## 2020-12-14 ENCOUNTER — Other Ambulatory Visit: Payer: Self-pay

## 2020-12-14 VITALS — BP 112/72 | HR 82 | Temp 96.2°F | Ht 70.0 in | Wt 231.0 lb

## 2020-12-14 DIAGNOSIS — R300 Dysuria: Secondary | ICD-10-CM | POA: Diagnosis not present

## 2020-12-14 DIAGNOSIS — I10 Essential (primary) hypertension: Secondary | ICD-10-CM | POA: Diagnosis not present

## 2020-12-14 DIAGNOSIS — D649 Anemia, unspecified: Secondary | ICD-10-CM

## 2020-12-14 DIAGNOSIS — E785 Hyperlipidemia, unspecified: Secondary | ICD-10-CM | POA: Diagnosis not present

## 2020-12-14 DIAGNOSIS — R351 Nocturia: Secondary | ICD-10-CM

## 2020-12-14 DIAGNOSIS — R319 Hematuria, unspecified: Secondary | ICD-10-CM

## 2020-12-14 DIAGNOSIS — R7303 Prediabetes: Secondary | ICD-10-CM | POA: Diagnosis not present

## 2020-12-14 MED ORDER — AMLODIPINE BESYLATE 10 MG PO TABS: 10.0000 mg | ORAL_TABLET | Freq: Every day | ORAL | 1 refills | Status: DC

## 2020-12-14 NOTE — Progress Notes (Signed)
Metrics: Intervention Frequency ACO  Documented Smoking Status Yearly  Screened one or more times in 24 months  Cessation Counseling or  Active cessation medication Past 24 months  Past 24 months   Guideline developer: UpToDate (See UpToDate for funding source) Date Released: 2014       Wellness Office Visit  Subjective:  Patient ID: Kevin Villa, male    DOB: Jun 08, 1955  Age: 65 y.o. MRN: [id number]  CC: This man comes in for follow-up of anemia, hypertension, hyperlipidemia, hematuria and prediabetes. HPI  He has been seen by hematology/oncology and work-up so far has been largely unrevealing.  The recommendation is for him to see urology and also get a colonoscopy which I agree with. He continues on amlodipine for hypertension. He continues with pravastatin for hyperlipidemia. He continues with time to loosen for his BPH. Past Medical History:  Diagnosis Date   Arthritis    osteoarthritis    Asthma    childhood, no inhaler, no problems as an adult   Cervical spinal stenosis    GERD (gastroesophageal reflux disease)    occ    Headache    occasional   Hematuria    Hyperlipidemia    Hypertension    Pneumonia    x 1 years ago    Pre-diabetes    no meds   Past Surgical History:  Procedure Laterality Date   ANTERIOR CERVICAL DECOMP/DISCECTOMY FUSION N/A 06/07/2020   Procedure: ANTERIOR CERVICAL DECOMPRESSION AND FUSION CERVICAL THREE-FOUR, CERVICAL FOUR-FIVE.;  Surgeon: Donalee Citrin, MD;  Location: [facility] OR;  Service: Neurosurgery;  Laterality: N/A;  anterior   HERNIA REPAIR     right hand surgery     pinky finger    TOTAL HIP ARTHROPLASTY Right 11/06/2017   Procedure: RIGHT TOTAL HIP ARTHROPLASTY ANTERIOR APPROACH;  Surgeon: Durene Romans, MD;  Location: [facility];  Service: Orthopedics;  Laterality: Right;  70 mins     Family History  Problem Relation Age of Onset   Diabetes Mother    Hypertension Mother    Hypertension Father    Cancer Brother        Unsure of  type   Hypertension Sister    Diabetes Sister    Hypertension Sister    Hypertension Sister    Hypertension Sister     Social History   Social History Narrative   Live in [place], [place]. On disability for hip pain.   Divorced, not dating.   No children.   Has friends and family.   Lives with mother or lives in his camper.   No pets.   Drinks about a 12 pack a weekend. Does not drink on a daily basis.    Does not drive b/c they expired and did not pay to get them renewed.    4 brothers and 4 sisters.    Left-handed.   No daily use of caffeine, occasional coffee.      Social History   Tobacco Use   Smoking status: Former    Packs/day: 0.25    Years: 30.00    Pack years: 7.50    Types: Cigarettes    Quit date: 2004    Years since quitting: 18.5   Smokeless tobacco: Never  Substance Use Topics   Alcohol use: Not Currently    Alcohol/week: 8.0 standard drinks    Types: 8 Cans of beer per week    Comment: occasional drinks gin or beer    Current Meds  Medication Sig   Cholecalciferol (VITAMIN  D3) 50 MCG (2000 UT) CAPS Take 1 capsule by mouth daily.   ferrous sulfate 325 (65 FE) MG EC tablet Take 1 tablet (325 mg total) by mouth daily with breakfast.   pravastatin (PRAVACHOL) 20 MG tablet Take 1 tablet (20 mg total) by mouth every evening.   senna-docusate (SENOKOT-S) 8.6-50 MG tablet Take 1 tablets by mouth twice a day as needed for constipation   tamsulosin (FLOMAX) 0.4 MG CAPS capsule Take 1 capsule (0.4 mg total) by mouth daily.   [DISCONTINUED] amLODipine (NORVASC) 10 MG tablet Take 1 tablet (10 mg total) by mouth daily.     Flowsheet Row Office Visit from 08/16/2020 in [facility]  PHQ-9 Total Score 0       Objective:   Today's Vitals: BP 112/72   Pulse 82   Temp (!) 96.2 F (35.7 C) (Temporal)   Ht 5\' 10"  (1.778 m)   Wt 231 lb (104.8 kg)   SpO2 96%   BMI 33.15 kg/m  Vitals with BMI 12/14/2020 11/23/2020 11/05/2020  Height 5\' 10"  - 5\' 10"    Weight 231 lbs 232 lbs 6 oz 231 lbs 6 oz  BMI 33.15 33.35 33.2  Systolic 112 131 01/05/2021  Diastolic 72 72 80  Pulse 82 86 78     Physical Exam   He looks systemically well.  Blood pressure is excellent.  Alert and orientated without any obvious focal neurological signs.    Assessment   1. Anemia, unspecified type   2. Prediabetes   3. HTN, goal below 140/90   4. Hematuria, unspecified type   5. Dysuria   6. Hyperlipidemia, unspecified hyperlipidemia type   7. Nocturia       Tests ordered Orders Placed This Encounter  Procedures   Ambulatory referral to Gastroenterology      Plan: 1.  Anemia, work-up per hematology/oncology.  I will refer him to gastroenterology for colonoscopy as he has never had one. 2.  Continue with amlodipine for hypertension and I have refilled this medication now. 3.  I will see him in about 6 months time for follow-up.    Meds ordered this encounter  Medications   amLODipine (NORVASC) 10 MG tablet    Sig: Take 1 tablet (10 mg total) by mouth daily.    Dispense:  90 tablet    Refill:  1     Claude Waldman , MD

## 2020-12-15 ENCOUNTER — Ambulatory Visit: Payer: Medicare Other | Admitting: Urology

## 2020-12-30 ENCOUNTER — Encounter (INDEPENDENT_AMBULATORY_CARE_PROVIDER_SITE_OTHER): Payer: Self-pay

## 2021-02-15 ENCOUNTER — Inpatient Hospital Stay (HOSPITAL_COMMUNITY): Payer: Medicare Other | Attending: Hematology

## 2021-02-22 ENCOUNTER — Ambulatory Visit (HOSPITAL_COMMUNITY): Payer: Medicare Other | Admitting: Physician Assistant

## 2021-02-23 NOTE — Progress Notes (Deleted)
Referring Provider:Gray, Dayton Scrape, NP Primary Care Physician:  No primary care provider on file. Primary Gastroenterologist:  Dr. Marland Kitchen  No chief complaint on file.   HPI:   Kevin Villa is a 65 y.o. male presenting today at the request of Keanan, Melander, NP for anemia and constipation.  Hemoglobin has been ranging from baseline of about 11-12 since 2017, intermittently with microcytic indices.  He has been evaluated by hematology.  Ferritin borderline low at 51, serum iron 65.  He was started on ferrous sulfate in June and was referred to urology for hematuria.  Stool cards were also ordered, but have not been completed.  Hematology also previously recommended GI evaluation.  Patient no-showed to his office visit with urology.  Today:   Past Medical History:  Diagnosis Date   Arthritis    osteoarthritis    Asthma    childhood, no inhaler, no problems as an adult   Cervical spinal stenosis    GERD (gastroesophageal reflux disease)    occ    Headache    occasional   Hematuria    Hyperlipidemia    Hypertension    Pneumonia    x 1 years ago    Pre-diabetes    no meds    Past Surgical History:  Procedure Laterality Date   ANTERIOR CERVICAL DECOMP/DISCECTOMY FUSION N/A 06/07/2020   Procedure: ANTERIOR CERVICAL DECOMPRESSION AND FUSION CERVICAL THREE-FOUR, CERVICAL FOUR-FIVE.;  Surgeon: Donalee Citrin, MD;  Location: [facility];  Service: Neurosurgery;  Laterality: N/A;  anterior   HERNIA REPAIR     right hand surgery     pinky finger    TOTAL HIP ARTHROPLASTY Right 11/06/2017   Procedure: RIGHT TOTAL HIP ARTHROPLASTY ANTERIOR APPROACH;  Surgeon: Durene Romans, MD;  Location: [facility];  Service: Orthopedics;  Laterality: Right;  70 mins    Current Outpatient Medications  Medication Sig Dispense Refill   amLODipine (NORVASC) 10 MG tablet Take 1 tablet (10 mg total) by mouth daily. 90 tablet 1   Cholecalciferol (VITAMIN D3) 50 MCG (2000 UT) CAPS Take 1 capsule by mouth daily.      ferrous sulfate 325 (65 FE) MG EC tablet Take 1 tablet (325 mg total) by mouth daily with breakfast. 30 tablet 5   pravastatin (PRAVACHOL) 20 MG tablet Take 1 tablet (20 mg total) by mouth every evening. 90 tablet 1   senna-docusate (SENOKOT-S) 8.6-50 MG tablet Take 1 tablets by mouth twice a day as needed for constipation 60 tablet 3   tamsulosin (FLOMAX) 0.4 MG CAPS capsule Take 1 capsule (0.4 mg total) by mouth daily. 90 capsule 0   No current facility-administered medications for this visit.    Allergies as of 02/24/2021 - Review Complete 12/14/2020  Allergen Reaction Noted   Peach flavor Hives 06/07/2020   Peanut-containing drug products Other (See Comments) 12/23/2010   Tomato Hives and Itching 12/23/2010    Family History  Problem Relation Age of Onset   Diabetes Mother    Hypertension Mother    Hypertension Father    Cancer Brother        Unsure of type   Hypertension Sister    Diabetes Sister    Hypertension Sister    Hypertension Sister    Hypertension Sister     Social History   Socioeconomic History   Marital status: Divorced    Spouse name: Not on file   Number of children: 0   Years of education: 11 grade   Highest education  level: 11th grade  Occupational History   Occupation: disability  Tobacco Use   Smoking status: Former    Packs/day: 0.25    Years: 30.00    Pack years: 7.50    Types: Cigarettes    Quit date: 2004    Years since quitting: 18.7   Smokeless tobacco: Never  Vaping Use   Vaping Use: Never used  Substance and Sexual Activity   Alcohol use: Not Currently    Alcohol/week: 8.0 standard drinks    Types: 8 Cans of beer per week    Comment: occasional drinks gin or beer   Drug use: No   Sexual activity: Never    Partners: Female  Other Topics Concern   Not on file  Social History Narrative   Live in [place], [place]. On disability for hip pain.   Divorced, not dating.   No children.   Has friends and family.   Lives with mother or  lives in his camper.   No pets.   Drinks about a 12 pack a weekend. Does not drink on a daily basis.    Does not drive b/c they expired and did not pay to get them renewed.    4 brothers and 4 sisters.    Left-handed.   No daily use of caffeine, occasional coffee.      Social Determinants of Health   Financial Resource Strain: Medium Risk   Difficulty of Paying Living Expenses: Somewhat hard  Food Insecurity: No Food Insecurity   Worried About Programme researcher, broadcasting/film/video in the Last Year: Never true   Ran Out of Food in the Last Year: Never true  Transportation Needs: No Transportation Needs   Lack of Transportation (Medical): No   Lack of Transportation (Non-Medical): No  Physical Activity: Insufficiently Active   Days of Exercise per Week: 7 days   Minutes of Exercise per Session: 20 min  Stress: No Stress Concern Present   Feeling of Stress : Not at all  Social Connections: Moderately Isolated   Frequency of Communication with Friends and Family: More than three times a week   Frequency of Social Gatherings with Friends and Family: More than three times a week   Attends Religious Services: More than 4 times per year   Active Member of [employer] or Organizations: No   Attends Engineer, structural: Never   Marital Status: Divorced  Catering manager Violence: Not At Risk   Fear of Current or Ex-Partner: No   Emotionally Abused: No   Physically Abused: No   Sexually Abused: No    Review of Systems: Gen: Denies any fever, chills, fatigue, weight loss, lack of appetite.  CV: Denies chest pain, heart palpitations, peripheral edema, syncope.  Resp: Denies shortness of breath at rest or with exertion. Denies wheezing or cough.  GI: Denies dysphagia or odynophagia. Denies jaundice, hematemesis, fecal incontinence. GU : Denies urinary burning, urinary frequency, urinary hesitancy MS: Denies joint pain, muscle weakness, cramps, or limitation of movement.  Derm: Denies rash, itching,  dry skin Psych: Denies depression, anxiety, memory loss, and confusion Heme: Denies bruising, bleeding, and enlarged lymph nodes.  Physical Exam: There were no vitals taken for this visit. General:   Alert and oriented. Pleasant and cooperative. Well-nourished and well-developed.  Head:  Normocephalic and atraumatic. Eyes:  Without icterus, sclera clear and conjunctiva pink.  Ears:  Normal auditory acuity. Nose:  No deformity, discharge,  or lesions. Mouth:  No deformity or lesions, oral mucosa pink.  Neck:  Supple, without mass or thyromegaly. Lungs:  Clear to auscultation bilaterally. No wheezes, rales, or rhonchi. No distress.  Heart:  S1, S2 present without murmurs appreciated.  Abdomen:  +BS, soft, non-tender and non-distended. No HSM noted. No guarding or rebound. No masses appreciated.  Rectal:  Deferred  Msk:  Symmetrical without gross deformities. Normal posture. Pulses:  Normal pulses noted. Extremities:  Without clubbing or edema. Neurologic:  Alert and  oriented x4;  grossly normal neurologically. Skin:  Intact without significant lesions or rashes. Cervical Nodes:  No significant cervical adenopathy. Psych:  Alert and cooperative. Normal mood and affect.

## 2021-02-24 ENCOUNTER — Ambulatory Visit: Payer: Medicare Other | Admitting: Gastroenterology

## 2021-06-16 ENCOUNTER — Ambulatory Visit (INDEPENDENT_AMBULATORY_CARE_PROVIDER_SITE_OTHER): Payer: Medicare Other | Admitting: Internal Medicine

## 2022-11-27 DIAGNOSIS — Z1211 Encounter for screening for malignant neoplasm of colon: Secondary | ICD-10-CM | POA: Diagnosis not present

## 2022-11-27 DIAGNOSIS — Z139 Encounter for screening, unspecified: Secondary | ICD-10-CM | POA: Diagnosis not present

## 2022-11-30 LAB — COLOGUARD

## 2022-11-30 LAB — EXTERNAL GENERIC LAB PROCEDURE

## 2022-12-06 DIAGNOSIS — R7303 Prediabetes: Secondary | ICD-10-CM | POA: Diagnosis not present

## 2022-12-06 DIAGNOSIS — Z0001 Encounter for general adult medical examination with abnormal findings: Secondary | ICD-10-CM | POA: Diagnosis not present

## 2022-12-06 DIAGNOSIS — D509 Iron deficiency anemia, unspecified: Secondary | ICD-10-CM | POA: Diagnosis not present

## 2022-12-06 DIAGNOSIS — Z23 Encounter for immunization: Secondary | ICD-10-CM | POA: Diagnosis not present

## 2022-12-06 DIAGNOSIS — E559 Vitamin D deficiency, unspecified: Secondary | ICD-10-CM | POA: Diagnosis not present

## 2022-12-06 DIAGNOSIS — R2241 Localized swelling, mass and lump, right lower limb: Secondary | ICD-10-CM | POA: Diagnosis not present

## 2022-12-06 DIAGNOSIS — I10 Essential (primary) hypertension: Secondary | ICD-10-CM | POA: Diagnosis not present

## 2022-12-06 DIAGNOSIS — E782 Mixed hyperlipidemia: Secondary | ICD-10-CM | POA: Diagnosis not present

## 2022-12-06 DIAGNOSIS — K5900 Constipation, unspecified: Secondary | ICD-10-CM | POA: Diagnosis not present

## 2022-12-11 DIAGNOSIS — Z1211 Encounter for screening for malignant neoplasm of colon: Secondary | ICD-10-CM | POA: Diagnosis not present

## 2022-12-19 LAB — EXTERNAL GENERIC LAB PROCEDURE: COLOGUARD: NEGATIVE

## 2022-12-19 LAB — COLOGUARD: COLOGUARD: NEGATIVE

## 2023-01-17 DIAGNOSIS — I10 Essential (primary) hypertension: Secondary | ICD-10-CM | POA: Diagnosis not present

## 2023-01-17 DIAGNOSIS — R234 Changes in skin texture: Secondary | ICD-10-CM | POA: Diagnosis not present

## 2023-01-31 DIAGNOSIS — L989 Disorder of the skin and subcutaneous tissue, unspecified: Secondary | ICD-10-CM | POA: Diagnosis not present

## 2023-01-31 DIAGNOSIS — J45909 Unspecified asthma, uncomplicated: Secondary | ICD-10-CM | POA: Diagnosis not present

## 2023-01-31 DIAGNOSIS — Z79899 Other long term (current) drug therapy: Secondary | ICD-10-CM | POA: Diagnosis not present

## 2023-01-31 DIAGNOSIS — R234 Changes in skin texture: Secondary | ICD-10-CM | POA: Diagnosis not present

## 2023-01-31 DIAGNOSIS — I10 Essential (primary) hypertension: Secondary | ICD-10-CM | POA: Diagnosis not present

## 2023-04-03 DIAGNOSIS — I10 Essential (primary) hypertension: Secondary | ICD-10-CM | POA: Diagnosis not present

## 2023-04-03 DIAGNOSIS — E782 Mixed hyperlipidemia: Secondary | ICD-10-CM | POA: Diagnosis not present

## 2023-04-03 DIAGNOSIS — R7303 Prediabetes: Secondary | ICD-10-CM | POA: Diagnosis not present

## 2023-04-03 DIAGNOSIS — E559 Vitamin D deficiency, unspecified: Secondary | ICD-10-CM | POA: Diagnosis not present

## 2023-04-03 DIAGNOSIS — D509 Iron deficiency anemia, unspecified: Secondary | ICD-10-CM | POA: Diagnosis not present

## 2023-04-09 DIAGNOSIS — D509 Iron deficiency anemia, unspecified: Secondary | ICD-10-CM | POA: Diagnosis not present

## 2023-04-09 DIAGNOSIS — E782 Mixed hyperlipidemia: Secondary | ICD-10-CM | POA: Diagnosis not present

## 2023-04-09 DIAGNOSIS — I10 Essential (primary) hypertension: Secondary | ICD-10-CM | POA: Diagnosis not present

## 2023-04-09 DIAGNOSIS — R079 Chest pain, unspecified: Secondary | ICD-10-CM | POA: Diagnosis not present

## 2023-04-09 DIAGNOSIS — E559 Vitamin D deficiency, unspecified: Secondary | ICD-10-CM | POA: Diagnosis not present

## 2023-04-09 DIAGNOSIS — Z0001 Encounter for general adult medical examination with abnormal findings: Secondary | ICD-10-CM | POA: Diagnosis not present

## 2023-04-09 DIAGNOSIS — M5412 Radiculopathy, cervical region: Secondary | ICD-10-CM | POA: Diagnosis not present

## 2023-04-09 DIAGNOSIS — R7303 Prediabetes: Secondary | ICD-10-CM | POA: Diagnosis not present

## 2023-04-11 DIAGNOSIS — R079 Chest pain, unspecified: Secondary | ICD-10-CM | POA: Diagnosis not present

## 2023-06-07 NOTE — Progress Notes (Signed)
 Updated medication list

## 2023-06-15 ENCOUNTER — Encounter: Payer: Self-pay | Admitting: Internal Medicine

## 2023-06-15 ENCOUNTER — Ambulatory Visit: Payer: 59 | Attending: Internal Medicine | Admitting: Internal Medicine

## 2023-06-15 ENCOUNTER — Telehealth: Payer: Self-pay | Admitting: Internal Medicine

## 2023-06-15 VITALS — BP 138/86 | HR 80 | Ht 71.0 in | Wt 251.8 lb

## 2023-06-15 DIAGNOSIS — Z136 Encounter for screening for cardiovascular disorders: Secondary | ICD-10-CM | POA: Diagnosis not present

## 2023-06-15 DIAGNOSIS — R079 Chest pain, unspecified: Secondary | ICD-10-CM | POA: Diagnosis not present

## 2023-06-15 NOTE — Progress Notes (Signed)
Cardiology Office Note  Date: 06/15/2023   ID: Kevin Villa, DOB 1955-05-30, MRN [id number]  PCP:  Benita Stabile, MD  Cardiologist:  Marjo Bicker, MD Electrophysiologist:  None   History of Present Illness: Kevin Villa is a 68 y.o. male known to have HTN, HLD was referred to cardiology clinic for evaluation of chest pain.  Ongoing chest pain for the last 1 to 2 months, occurs daily, occurs especially when he is in the supine position and relieved when he sits up.  It also occurs/gets worse when he is in left lateral position and relieved when he sits up.  Chest pain also wakes him up from sleep every day.  No DOE, orthopnea, PND, leg swelling.  No palpitations, dizziness.  He does have positional dizziness.  He also has neck pain radiating to his bilateral upper extremities associated with numbness and weakness.  Weakness is mainly in his left arm.   Past Medical History:  Diagnosis Date   Arthritis    osteoarthritis    Asthma    childhood, no inhaler, no problems as an adult   Cervical spinal stenosis    GERD (gastroesophageal reflux disease)    occ    Headache    occasional   Hematuria    Hyperlipidemia    Hypertension    Pneumonia    x 1 years ago    Pre-diabetes    no meds    Past Surgical History:  Procedure Laterality Date   ANTERIOR CERVICAL DECOMP/DISCECTOMY FUSION N/A 06/07/2020   Procedure: ANTERIOR CERVICAL DECOMPRESSION AND FUSION CERVICAL THREE-FOUR, CERVICAL FOUR-FIVE.;  Surgeon: Donalee Citrin, MD;  Location: [facility];  Service: Neurosurgery;  Laterality: N/A;  anterior   HERNIA REPAIR     right hand surgery     pinky finger    TOTAL HIP ARTHROPLASTY Right 11/06/2017   Procedure: RIGHT TOTAL HIP ARTHROPLASTY ANTERIOR APPROACH;  Surgeon: Durene Romans, MD;  Location: [facility];  Service: Orthopedics;  Laterality: Right;  70 mins    Current Outpatient Medications  Medication Sig Dispense Refill   albuterol (PROVENTIL) (2.5 MG/3ML) 0.083% nebulizer  solution Take 2.5 mg by nebulization 3 (three) times daily as needed.     amLODipine (NORVASC) 5 MG tablet Take 5 mg by mouth daily.     aspirin EC 81 MG tablet Take 81 mg by mouth daily. Swallow whole.     Cholecalciferol (VITAMIN D3) 50 MCG (2000 UT) CAPS Take 1 capsule by mouth daily.     pravastatin (PRAVACHOL) 20 MG tablet Take 1 tablet (20 mg total) by mouth every evening. 90 tablet 1   senna-docusate (SENOKOT-S) 8.6-50 MG tablet Take 1 tablets by mouth twice a day as needed for constipation 60 tablet 3   vitamin B-12 (CYANOCOBALAMIN) 500 MCG tablet Take 500 mcg by mouth daily.     No current facility-administered medications for this visit.   Allergies:  Peach flavoring agent (non-screening), Peanut-containing drug products, and Tomato   Social History: The patient  reports that he quit smoking about 21 years ago. His smoking use included cigarettes. He started smoking about 51 years ago. He has a 7.5 pack-year smoking history. He has never used smokeless tobacco. He reports that he does not currently use alcohol after a past usage of about 8.0 standard drinks of alcohol per week. He reports that he does not use drugs.   Family History: The patient's family history includes Cancer in his brother; Diabetes in his mother and  sister; Hypertension in his father, mother, sister, sister, sister, and sister.   ROS:  Please see the history of present illness. Otherwise, complete review of systems is positive for none  All other systems are reviewed and negative.   Physical Exam: VS:  BP 138/86   Pulse 80   Ht 5\' 11"  (1.803 m)   Wt 251 lb 12.8 oz (114.2 kg)   SpO2 96%   BMI 35.12 kg/m , BMI Body mass index is 35.12 kg/m.  Wt Readings from Last 3 Encounters:  06/15/23 251 lb 12.8 oz (114.2 kg)  12/14/20 231 lb (104.8 kg)  11/23/20 232 lb 6.4 oz (105.4 kg)    General: Patient appears comfortable at rest. HEENT: Conjunctiva and lids normal, oropharynx clear with moist mucosa. Neck:  Supple, no elevated JVP or carotid bruits, no thyromegaly. Lungs: Clear to auscultation, nonlabored breathing at rest. Cardiac: Regular rate and rhythm, no S3 or significant systolic murmur, no pericardial rub. Abdomen: Soft, nontender, no hepatomegaly, bowel sounds present, no guarding or rebound. Extremities: No pitting edema, distal pulses 2+. Skin: Warm and dry. Musculoskeletal: No kyphosis. Neuropsychiatric: Alert and oriented x3, affect grossly appropriate.  Recent Labwork: No results found for requested labs within last 365 days.     Component Value Date/Time   CHOL 191 07/15/2020 1149   TRIG 115 07/15/2020 1149   HDL 63 07/15/2020 1149   CHOLHDL 3.0 07/15/2020 1149   LDLCALC 107 (H) 07/15/2020 1149    Other Studies Reviewed Today:   Assessment and Plan:  Chest pain: Ongoing chest pain for the last 1 to 2 months, occurs almost daily, worsens with supine position and improves with sitting up.  Pain starts in the right side of his chest and radiates to the left side of his chest and he happens to have bilateral arm numbness in the same time.  His chest pains are very atypical for ischemia but atypical for pericarditis, will obtain ESR, CRP, echocardiogram and Lexiscan.  Troponin T was mildly elevated however this is nonspecific and this was obtained in our 2024.  Cervical myelopathy: Patient recently had cervical spine surgery.  He continues to have shooting pain from his neck into his bilateral upper extremities associated with numbness and tingling in his fingers.  He also has muscle weakness in his left arm.  Strongly encouraged patient to make an appointment with his surgeon.  HTN, controlled: Continue amlodipine 5 mg once daily, follows with PCP  HLD, at goal: Continue pravastatin 20 mg at bedtime.  Goal LDL less than 100.  Follows with PCP.       Medication Adjustments/Labs and Tests Ordered: Current medicines are reviewed at length with the patient today.  Concerns  regarding medicines are outlined above.    Disposition:  Follow up  pending results  Signed Laniqua Torrens Verne Spurr, MD, 06/15/2023 2:12 PM    [facility] at [facility] [address] [place], [place], [place] [address]

## 2023-06-15 NOTE — Patient Instructions (Addendum)
Medication Instructions:  Your physician recommends that you continue on your current medications as directed. Please refer to the Current Medication list given to you today.   Labwork: ESR and CRP to be completed at [facility] in [place]  Testing/Procedures: Your physician has requested that you have an echocardiogram. Echocardiography is a painless test that uses sound waves to create images of your heart. It provides your doctor with information about the size and shape of your heart and how well your heart's chambers and valves are working. This procedure takes approximately one hour. There are no restrictions for this procedure. Please do NOT wear cologne, perfume, aftershave, or lotions (deodorant is allowed). Please arrive 15 minutes prior to your appointment time.  Please note: We ask at that you not bring children with you during ultrasound (echo/ vascular) testing. Due to room size and safety concerns, children are not allowed in the ultrasound rooms during exams. Our front office staff cannot provide observation of children in our lobby area while testing is being conducted. An adult accompanying a patient to their appointment will only be allowed in the ultrasound room at the discretion of the ultrasound technician under special circumstances. We apologize for any inconvenience.  Your physician has requested that you have a lexiscan myoview. For further information please visit [email or web address] Please follow instruction sheet, as given.   Follow-Up: Your physician recommends that you schedule a follow-up appointment in: Pending results  Any Other Special Instructions Will Be Listed Below (If Applicable). Thank you for choosing [facility]!      If you need a refill on your cardiac medications before your next appointment, please call your pharmacy.

## 2023-06-15 NOTE — Telephone Encounter (Signed)
Checking percert on the following patient for testing scheduled at [facility].    LEXISCAN    06/25/2023

## 2023-06-16 LAB — C-REACTIVE PROTEIN: CRP: 2 mg/L (ref 0–10)

## 2023-06-16 LAB — SEDIMENTATION RATE: Sed Rate: 4 mm/h (ref 0–30)

## 2023-06-18 ENCOUNTER — Telehealth: Payer: Self-pay

## 2023-06-18 NOTE — Telephone Encounter (Signed)
Patient informed and verbalized understanding of plan. 

## 2023-06-18 NOTE — Telephone Encounter (Signed)
-----   Message from Vishnu P Mallipeddi sent at 06/18/2023  3:32 PM EST ----- ESR and CRP normal.  Chest pain unlikely from pericarditis.

## 2023-06-25 ENCOUNTER — Ambulatory Visit (HOSPITAL_COMMUNITY)
Admission: RE | Admit: 2023-06-25 | Discharge: 2023-06-25 | Disposition: A | Discharge status: Home / Self Care | Payer: 59 | Source: Ambulatory Visit | Attending: Internal Medicine | Admitting: Internal Medicine

## 2023-06-25 ENCOUNTER — Encounter (HOSPITAL_COMMUNITY)
Admission: RE | Admit: 2023-06-25 | Discharge: 2023-06-25 | Disposition: A | Discharge status: Home / Self Care | Payer: 59 | Source: Ambulatory Visit | Attending: Internal Medicine | Admitting: Internal Medicine

## 2023-06-25 DIAGNOSIS — R079 Chest pain, unspecified: Secondary | ICD-10-CM | POA: Insufficient documentation

## 2023-06-25 LAB — NM MYOCAR MULTI W/SPECT W/WALL MOTION / EF
LV dias vol: 94 mL (ref 62–150)
LV sys vol: 34 mL
Nuc Stress EF: 64 %
Peak HR: 93 {beats}/min
RATE: 0.3
Rest HR: 75 {beats}/min
Rest Nuclear Isotope Dose: 11 mCi
SDS: 5
SRS: 5
SSS: 10
ST Depression (mm): 0 mm
Stress Nuclear Isotope Dose: 31.4 mCi
TID: 0.99

## 2023-06-25 MED ORDER — TECHNETIUM TC 99M TETROFOSMIN IV KIT
31.4000 | PACK | Freq: Once | INTRAVENOUS | Status: AC | PRN
  Administered 2023-06-25: 31.4 via INTRAVENOUS

## 2023-06-25 MED ORDER — SODIUM CHLORIDE FLUSH 0.9 % IV SOLN
INTRAVENOUS | Status: AC
  Administered 2023-06-25: 10 mL via INTRAVENOUS
  Filled 2023-06-25: qty 10

## 2023-06-25 MED ORDER — TECHNETIUM TC 99M TETROFOSMIN IV KIT
11.0000 | PACK | Freq: Once | INTRAVENOUS | Status: AC | PRN
Start: 2023-06-25 — End: 2023-06-25
  Administered 2023-06-25: 11 via INTRAVENOUS

## 2023-06-25 MED ORDER — REGADENOSON 0.4 MG/5ML IV SOLN
INTRAVENOUS | Status: AC
  Administered 2023-06-25: 0.4 mg via INTRAVENOUS
  Filled 2023-06-25: qty 5

## 2023-06-26 ENCOUNTER — Ambulatory Visit: Payer: 59 | Attending: Internal Medicine

## 2023-06-26 DIAGNOSIS — R079 Chest pain, unspecified: Secondary | ICD-10-CM | POA: Diagnosis not present

## 2023-06-27 LAB — ECHOCARDIOGRAM COMPLETE
AR max vel: 4.52 cm2
AV Area VTI: 4.99 cm2
AV Area mean vel: 4.24 cm2
AV Mean grad: 3 mm[Hg]
AV Peak grad: 6.9 mm[Hg]
Ao pk vel: 1.31 m/s
Area-P 1/2: 2.99 cm2
Calc EF: 68.1 %
MV VTI: 3.63 cm2
S' Lateral: 3 cm
Single Plane A2C EF: 76.7 %
Single Plane A4C EF: 55.7 %

## 2023-07-02 ENCOUNTER — Telehealth: Payer: Self-pay

## 2023-07-02 NOTE — Telephone Encounter (Signed)
 Patient informed and verbalized understanding of plan.

## 2023-07-02 NOTE — Telephone Encounter (Signed)
-----   Message from Vishnu P Mallipeddi sent at 06/29/2023 12:31 PM EST ----- Normal stress test.

## 2023-07-02 NOTE — Telephone Encounter (Signed)
-----   Message from Vishnu P Mallipeddi sent at 06/29/2023  4:11 PM EST ----- Normal pumping function of the heart, G1 DD with normal LVEDP, CVP 3 mmHg and no valvular heart disease.  Overall normal echocardiogram.  Echocardiogram and stress test unremarkable.  ESR and CRP also within normal limits.  Chest pain unlikely cardiac.  Schedule follow-up as needed.

## 2023-08-09 DIAGNOSIS — R7303 Prediabetes: Secondary | ICD-10-CM | POA: Diagnosis not present

## 2023-08-09 DIAGNOSIS — I10 Essential (primary) hypertension: Secondary | ICD-10-CM | POA: Diagnosis not present

## 2023-08-15 DIAGNOSIS — J302 Other seasonal allergic rhinitis: Secondary | ICD-10-CM | POA: Diagnosis not present

## 2023-08-15 DIAGNOSIS — R079 Chest pain, unspecified: Secondary | ICD-10-CM | POA: Diagnosis not present

## 2023-08-15 DIAGNOSIS — E559 Vitamin D deficiency, unspecified: Secondary | ICD-10-CM | POA: Diagnosis not present

## 2023-08-15 DIAGNOSIS — M5412 Radiculopathy, cervical region: Secondary | ICD-10-CM | POA: Diagnosis not present

## 2023-08-15 DIAGNOSIS — E782 Mixed hyperlipidemia: Secondary | ICD-10-CM | POA: Diagnosis not present

## 2023-08-15 DIAGNOSIS — Z79899 Other long term (current) drug therapy: Secondary | ICD-10-CM | POA: Diagnosis not present

## 2023-08-15 DIAGNOSIS — R7303 Prediabetes: Secondary | ICD-10-CM | POA: Diagnosis not present

## 2023-08-15 DIAGNOSIS — I10 Essential (primary) hypertension: Secondary | ICD-10-CM | POA: Diagnosis not present

## 2023-08-15 DIAGNOSIS — D509 Iron deficiency anemia, unspecified: Secondary | ICD-10-CM | POA: Diagnosis not present

## 2023-09-14 ENCOUNTER — Telehealth: Payer: Self-pay

## 2023-09-14 NOTE — Progress Notes (Signed)
   09/14/2023  Patient ID: Kevin Villa, male   DOB: 04/29/1956, 68 y.o.   MRN: [id number]   Patient appeared on insurance report for not passing the quality metrics in 2024:  Medication Adherence for Hypertension [facility])   Outreach to the patient was Successful.   Meds Tracking:  -Losartan 25 mg - Last fill 90DS on 01/17/23, BP 132/64 on 08/15/23. Does not qualify for metric, likely overdue. Confirmed he is close to running out of losartan and amlodipine . Requested refills on both.  Plan:  Sent losartan 25 mg and amlodipine  5 mg refills to [facility] per patient's request. Will review fill history in 1 week and begin following adherence the rest of the year once he fills medications.   Flint Hummer, PharmD

## 2023-09-21 ENCOUNTER — Telehealth: Payer: Self-pay

## 2023-09-21 NOTE — Progress Notes (Signed)
   09/21/2023  Patient ID: Kevin Villa, male   DOB: 25-Jan-1956, 68 y.o.   MRN: [id number]   Patient appeared on insurance report for not passing the quality metrics in 2024:  Medication Adherence for Hypertension [facility])   Outreach to the patient was Successful.   Meds Tracking:  -Losartan 25 mg - Last fill 100DS on 09/14/23, BP 132/64 on 08/15/23. Does not qualify for metric, next fill due 12/23/23.  Plan:  Confirmed fill of both amlodipine  and losartan, now has 100DS, will continue to follow adherence the rest of the year. Next fill history review on 12/26/23.   Flint Hummer, PharmD

## 2023-11-02 ENCOUNTER — Emergency Department (HOSPITAL_COMMUNITY)

## 2023-11-02 ENCOUNTER — Other Ambulatory Visit: Payer: Self-pay

## 2023-11-02 ENCOUNTER — Emergency Department (HOSPITAL_COMMUNITY)
Admission: EM | Admit: 2023-11-02 | Discharge: 2023-11-02 | Disposition: A | Discharge status: Home / Self Care | Attending: Emergency Medicine | Admitting: Emergency Medicine

## 2023-11-02 ENCOUNTER — Encounter (HOSPITAL_COMMUNITY): Payer: Self-pay

## 2023-11-02 DIAGNOSIS — Z91010 Allergy to peanuts: Secondary | ICD-10-CM | POA: Diagnosis not present

## 2023-11-02 DIAGNOSIS — F17210 Nicotine dependence, cigarettes, uncomplicated: Secondary | ICD-10-CM | POA: Insufficient documentation

## 2023-11-02 DIAGNOSIS — J101 Influenza due to other identified influenza virus with other respiratory manifestations: Secondary | ICD-10-CM | POA: Diagnosis not present

## 2023-11-02 DIAGNOSIS — I700 Atherosclerosis of aorta: Secondary | ICD-10-CM | POA: Diagnosis not present

## 2023-11-02 DIAGNOSIS — J45909 Unspecified asthma, uncomplicated: Secondary | ICD-10-CM | POA: Diagnosis not present

## 2023-11-02 DIAGNOSIS — Z7982 Long term (current) use of aspirin: Secondary | ICD-10-CM | POA: Diagnosis not present

## 2023-11-02 DIAGNOSIS — R059 Cough, unspecified: Secondary | ICD-10-CM | POA: Diagnosis not present

## 2023-11-02 LAB — I-STAT CHEM 8, ED
BUN: 17 mg/dL (ref 8–23)
Calcium, Ion: 1.19 mmol/L (ref 1.15–1.40)
Chloride: 102 mmol/L (ref 98–111)
Creatinine, Ser: 1.1 mg/dL (ref 0.61–1.24)
Glucose, Bld: 97 mg/dL (ref 70–99)
HCT: 42 % (ref 39.0–52.0)
Hemoglobin: 14.3 g/dL (ref 13.0–17.0)
Potassium: 4 mmol/L (ref 3.5–5.1)
Sodium: 140 mmol/L (ref 135–145)
TCO2: 26 mmol/L (ref 22–32)

## 2023-11-02 LAB — RESP PANEL BY RT-PCR (RSV, FLU A&B, COVID)  RVPGX2
Influenza A by PCR: POSITIVE — AB
Influenza B by PCR: NEGATIVE
Resp Syncytial Virus by PCR: NEGATIVE
SARS Coronavirus 2 by RT PCR: NEGATIVE

## 2023-11-02 MED ORDER — ALBUTEROL SULFATE HFA 108 (90 BASE) MCG/ACT IN AERS
2.0000 | INHALATION_SPRAY | RESPIRATORY_TRACT | Status: AC
  Administered 2023-11-02: 2 via RESPIRATORY_TRACT
  Filled 2023-11-02: qty 6.7

## 2023-11-02 MED ORDER — PREDNISONE 20 MG PO TABS: 40.0000 mg | ORAL_TABLET | Freq: Every day | ORAL | 0 refills | Status: AC

## 2023-11-02 MED ORDER — ONDANSETRON 4 MG PO TBDP
4.0000 mg | ORAL_TABLET | Freq: Three times a day (TID) | ORAL | 0 refills | Status: AC | PRN
Start: 2023-11-02 — End: ?

## 2023-11-02 MED ORDER — GUAIFENESIN-CODEINE 100-10 MG/5ML PO SOLN: 5.0000 mL | Freq: Three times a day (TID) | ORAL | 0 refills | Status: AC | PRN

## 2023-11-02 MED ORDER — OSELTAMIVIR PHOSPHATE 75 MG PO CAPS: 75.0000 mg | ORAL_CAPSULE | Freq: Two times a day (BID) | ORAL | 0 refills | Status: AC

## 2023-11-02 MED ORDER — PREDNISONE 20 MG PO TABS
40.0000 mg | ORAL_TABLET | Freq: Once | ORAL | Status: AC
  Administered 2023-11-02: 40 mg via ORAL
  Filled 2023-11-02: qty 2

## 2023-11-02 MED ORDER — OSELTAMIVIR PHOSPHATE 75 MG PO CAPS
75.0000 mg | ORAL_CAPSULE | ORAL | Status: AC
  Administered 2023-11-02: 75 mg via ORAL
  Filled 2023-11-02: qty 1

## 2023-11-02 NOTE — ED Provider Notes (Signed)
 [place] EMERGENCY DEPARTMENT AT [facility] Provider Note   CSN: [id number] Arrival date & time: 11/02/23  1811     History  No chief complaint on file.   Kevin Villa is a 68 y.o. male.  68 yo male with history of childhood asthma presents to the emergency department with cough, fevers, and rhinorrhea.  States symptoms started last night.  Cough has been productive of white sputum.  Has had some chest pain from the coughing but not otherwise.  No known sick contacts.  Had asthma as a child.  Used to smoke cigarettes as well.       Home Medications Prior to Admission medications   Medication Sig Start Date End Date Taking? Authorizing Provider  guaiFENesin -codeine  100-10 MG/5ML syrup Take 5 mLs by mouth 3 (three) times daily as needed for cough. 11/02/23  Yes Ninetta Basket, MD  ondansetron  (ZOFRAN -ODT) 4 MG disintegrating tablet Take 1 tablet (4 mg total) by mouth every 8 (eight) hours as needed for nausea or vomiting. 11/02/23  Yes Ninetta Basket, MD  oseltamivir  (TAMIFLU ) 75 MG capsule Take 1 capsule (75 mg total) by mouth every 12 (twelve) hours. 11/02/23  Yes Ninetta Basket, MD  albuterol  (PROVENTIL ) (2.5 MG/3ML) 0.083% nebulizer solution Take 2.5 mg by nebulization 3 (three) times daily as needed.    [provider]  amLODipine  (NORVASC ) 5 MG tablet Take 5 mg by mouth daily. 04/09/23   [provider]  aspirin  EC 81 MG tablet Take 81 mg by mouth daily. Swallow whole.    [provider]  Azelastine HCl 137 MCG/SPRAY SOLN Place 2 sprays into both nostrils 2 (two) times daily. 08/15/23   [provider]  Cholecalciferol (VITAMIN D3) 50 MCG (2000 UT) CAPS Take 1 capsule by mouth daily.    [provider]  losartan (COZAAR) 25 MG tablet Take 25 mg by mouth daily. 04/09/23   [provider]  pravastatin  (PRAVACHOL ) 20 MG tablet Take 1 tablet (20 mg total) by mouth every evening. 04/21/19   Zorita Hiss, NP   senna-docusate (SENOKOT-S) 8.6-50 MG tablet Take 1 tablets by mouth twice a day as needed for constipation 08/16/20   Zorita Hiss, NP  vitamin B-12 (CYANOCOBALAMIN) 500 MCG tablet Take 500 mcg by mouth daily.    [provider]      Allergies    Tomato, Peach flavoring agent (non-screening), and Peanut-containing drug products    Review of Systems   Review of Systems  Physical Exam Updated Vital Signs BP (!) 142/82 (BP Location: Right Arm)   Pulse 86   Temp 98.8 F (37.1 C) (Oral)   Resp 20   Ht 5' 11 (1.803 m)   Wt 104.3 kg   SpO2 93%   BMI 32.08 kg/m  Physical Exam Vitals and nursing note reviewed.  Constitutional:      General: He is not in acute distress.    Appearance: He is well-developed.  HENT:     Head: Normocephalic and atraumatic.     Right Ear: External ear normal.     Left Ear: External ear normal.     Nose: Nose normal.     Mouth/Throat:     Mouth: Mucous membranes are moist.     Pharynx: Posterior oropharyngeal erythema present. No oropharyngeal exudate.  Eyes:     Extraocular Movements: Extraocular movements intact.     Conjunctiva/sclera: Conjunctivae normal.     Pupils: Pupils are equal, round, and reactive  to light.  Cardiovascular:     Rate and Rhythm: Normal rate and regular rhythm.     Heart sounds: Normal heart sounds.  Pulmonary:     Effort: Pulmonary effort is normal. No respiratory distress.     Breath sounds: Rhonchi present.  Musculoskeletal:     Cervical back: Normal range of motion and neck supple.     Right lower leg: No edema.     Left lower leg: No edema.  Skin:    General: Skin is warm and dry.  Neurological:     Mental Status: He is alert. Mental status is at baseline.  Psychiatric:        Mood and Affect: Mood normal.        Behavior: Behavior normal.     ED Results / Procedures / Treatments   Labs (all labs ordered are listed, but only abnormal results are displayed) Labs Reviewed  RESP PANEL BY RT-PCR  (RSV, FLU A&B, COVID)  RVPGX2 - Abnormal; Notable for the following components:      Result Value   Influenza A by PCR POSITIVE (*)    All other components within normal limits  I-STAT CHEM 8, ED    EKG None  Radiology No results found.   Procedures Procedures    Medications Ordered in ED Medications  albuterol  (VENTOLIN  HFA) 108 (90 Base) MCG/ACT inhaler 2 puff (2 puffs Inhalation Given 11/02/23 2040)  predniSONE  (DELTASONE ) tablet 40 mg (40 mg Oral Given 11/02/23 2038)  oseltamivir  (TAMIFLU ) capsule 75 mg (75 mg Oral Given 11/02/23 2038)    ED Course/ Medical Decision Making/ A&P                                 Medical Decision Making Amount and/or Complexity of Data Reviewed Radiology: ordered.  Risk OTC drugs. Prescription drug management.   Recent-year-old male with a history of childhood asthma who presents emergency department with URI type symptoms  Initial Ddx:  URI, pneumonia, asthma, COPD  MDM/Course:  Patient presents emergency department with URI type symptoms.  Also is having some rhonchi on exam.  Satting well on room air and is overall well-appearing otherwise.  Had a COVID and flu which shows that he has influenza A.  Chest x-Kyi without pneumonia.  Did send off a chemistry to assess his renal function which had not been drawn in a while.  It was normal.  With the onset of his symptoms he is a candidate for Tamiflu .  With his rhonchi and history of childhood asthma we will go ahead and also give him prednisone  and albuterol  in case this is playing a role in the symptoms.  1 follow-up with his primary doctor in several days.    This patient presents to the ED for concern of complaints listed in HPI, this involves an extensive number of treatment options, and is a complaint that carries with it a high risk of complications and morbidity. Disposition including potential need for admission considered.   Dispo: DC Home. Return precautions discussed including,  but not limited to, those listed in the AVS. Allowed pt time to ask questions which were answered fully prior to dc.  Records reviewed Outpatient Clinic Notes The following labs were independently interpreted: Chemistry and show no acute abnormality I independently reviewed the following imaging with scope of interpretation limited to determining acute life threatening conditions related to emergency care: Chest x-Jevonte and agree with the  radiologist interpretation with the following exceptions: none I have reviewed the patients home medications and made adjustments as needed Social Determinants of health:  Geriatric  Portions of this note were generated with Scientist, clinical (histocompatibility and immunogenetics). Dictation errors may occur despite best attempts at proofreading.     Final Clinical Impression(s) / ED Diagnoses Final diagnoses:  Influenza A    Rx / DC Orders ED Discharge Orders          Ordered    oseltamivir  (TAMIFLU ) 75 MG capsule  Every 12 hours        11/02/23 2057    predniSONE  (DELTASONE ) 20 MG tablet  Daily        11/02/23 2057    guaiFENesin -codeine  100-10 MG/5ML syrup  3 times daily PRN        11/02/23 2058    ondansetron  (ZOFRAN -ODT) 4 MG disintegrating tablet  Every 8 hours PRN        11/02/23 2059              Ninetta Basket, MD 11/07/23 1212

## 2023-11-02 NOTE — ED Triage Notes (Signed)
 Pt reports he has had a cough since this morning that will not stop.

## 2023-11-02 NOTE — Discharge Instructions (Signed)
 You were seen for your influenza infection in the emergency department.   At home, please use Tylenol  for your muscle aches and fevers.  Please use over-the-counter cough medication or tea with honey for your cough.  You may also use the codeine that I have prescribed you.  Take the Tamiflu for your influenza.  Use the Zofran  for any nausea or vomiting that you may have.  Use the inhaler and steroids for your wheezing and cough.  Follow-up with your primary doctor in 2-3 days regarding your visit.  This may be over the phone.  Return immediately to the emergency department if you experience any of the following: Difficulty breathing, or any other concerning symptoms.    Thank you for visiting our Emergency Department. It was a pleasure taking care of you today.

## 2023-11-09 DIAGNOSIS — E559 Vitamin D deficiency, unspecified: Secondary | ICD-10-CM | POA: Diagnosis not present

## 2023-11-09 DIAGNOSIS — K5900 Constipation, unspecified: Secondary | ICD-10-CM | POA: Diagnosis not present

## 2023-11-20 ENCOUNTER — Emergency Department (HOSPITAL_COMMUNITY)
Admission: EM | Admit: 2023-11-20 | Discharge: 2023-11-20 | Disposition: A | Discharge status: Home / Self Care | Attending: Emergency Medicine | Admitting: Emergency Medicine

## 2023-11-20 ENCOUNTER — Other Ambulatory Visit: Payer: Self-pay

## 2023-11-20 ENCOUNTER — Emergency Department (HOSPITAL_COMMUNITY)

## 2023-11-20 ENCOUNTER — Encounter (HOSPITAL_COMMUNITY): Payer: Self-pay

## 2023-11-20 DIAGNOSIS — X58XXXA Exposure to other specified factors, initial encounter: Secondary | ICD-10-CM | POA: Diagnosis not present

## 2023-11-20 DIAGNOSIS — S39012A Strain of muscle, fascia and tendon of lower back, initial encounter: Secondary | ICD-10-CM | POA: Insufficient documentation

## 2023-11-20 DIAGNOSIS — N3289 Other specified disorders of bladder: Secondary | ICD-10-CM | POA: Diagnosis not present

## 2023-11-20 DIAGNOSIS — S3992XA Unspecified injury of lower back, initial encounter: Secondary | ICD-10-CM | POA: Diagnosis present

## 2023-11-20 DIAGNOSIS — Z7982 Long term (current) use of aspirin: Secondary | ICD-10-CM | POA: Insufficient documentation

## 2023-11-20 DIAGNOSIS — Z91010 Allergy to peanuts: Secondary | ICD-10-CM | POA: Insufficient documentation

## 2023-11-20 DIAGNOSIS — Z79899 Other long term (current) drug therapy: Secondary | ICD-10-CM | POA: Diagnosis not present

## 2023-11-20 DIAGNOSIS — I10 Essential (primary) hypertension: Secondary | ICD-10-CM | POA: Diagnosis not present

## 2023-11-20 DIAGNOSIS — R109 Unspecified abdominal pain: Secondary | ICD-10-CM | POA: Diagnosis not present

## 2023-11-20 LAB — URINALYSIS, ROUTINE W REFLEX MICROSCOPIC
Bacteria, UA: NONE SEEN
Bilirubin Urine: NEGATIVE
Glucose, UA: NEGATIVE mg/dL
Ketones, ur: NEGATIVE mg/dL
Leukocytes,Ua: NEGATIVE
Nitrite: NEGATIVE
Protein, ur: 30 mg/dL — AB
Specific Gravity, Urine: 1.026 (ref 1.005–1.030)
pH: 5 (ref 5.0–8.0)

## 2023-11-20 LAB — CBC WITH DIFFERENTIAL/PLATELET
Abs Immature Granulocytes: 0.02 10*3/uL (ref 0.00–0.07)
Basophils Absolute: 0 10*3/uL (ref 0.0–0.1)
Basophils Relative: 1 %
Eosinophils Absolute: 0.1 10*3/uL (ref 0.0–0.5)
Eosinophils Relative: 1 %
HCT: 43.9 % (ref 39.0–52.0)
Hemoglobin: 13.8 g/dL (ref 13.0–17.0)
Immature Granulocytes: 0 %
Lymphocytes Relative: 41 %
Lymphs Abs: 3.5 10*3/uL (ref 0.7–4.0)
MCH: 26 pg (ref 26.0–34.0)
MCHC: 31.4 g/dL (ref 30.0–36.0)
MCV: 82.8 fL (ref 80.0–100.0)
Monocytes Absolute: 0.6 10*3/uL (ref 0.1–1.0)
Monocytes Relative: 7 %
Neutro Abs: 4.2 10*3/uL (ref 1.7–7.7)
Neutrophils Relative %: 50 %
Platelets: 215 10*3/uL (ref 150–400)
RBC: 5.3 MIL/uL (ref 4.22–5.81)
RDW: 14.2 % (ref 11.5–15.5)
WBC: 8.5 10*3/uL (ref 4.0–10.5)
nRBC: 0 % (ref 0.0–0.2)

## 2023-11-20 LAB — COMPREHENSIVE METABOLIC PANEL WITH GFR
ALT: 20 U/L (ref 0–44)
AST: 23 U/L (ref 15–41)
Albumin: 4.2 g/dL (ref 3.5–5.0)
Alkaline Phosphatase: 73 U/L (ref 38–126)
Anion gap: 13 (ref 5–15)
BUN: 17 mg/dL (ref 8–23)
CO2: 24 mmol/L (ref 22–32)
Calcium: 9.8 mg/dL (ref 8.9–10.3)
Chloride: 101 mmol/L (ref 98–111)
Creatinine, Ser: 0.97 mg/dL (ref 0.61–1.24)
GFR, Estimated: >60 mL/min (ref >60)
Glucose, Bld: 109 mg/dL — ABNORMAL HIGH (ref 70–99)
Potassium: 3.7 mmol/L (ref 3.5–5.1)
Sodium: 138 mmol/L (ref 135–145)
Total Bilirubin: 0.7 mg/dL (ref 0.0–1.2)
Total Protein: 8.2 g/dL — ABNORMAL HIGH (ref 6.5–8.1)

## 2023-11-20 MED ORDER — HYDROMORPHONE HCL 1 MG/ML IJ SOLN
0.5000 mg | Freq: Once | INTRAMUSCULAR | Status: AC
  Administered 2023-11-20: 0.5 mg via INTRAVENOUS
  Filled 2023-11-20: qty 0.5

## 2023-11-20 MED ORDER — ONDANSETRON HCL 4 MG/2ML IJ SOLN
4.0000 mg | Freq: Once | INTRAMUSCULAR | Status: AC
  Administered 2023-11-20: 4 mg via INTRAVENOUS
  Filled 2023-11-20: qty 2

## 2023-11-20 MED ORDER — OXYCODONE-ACETAMINOPHEN 5-325 MG PO TABS: ORAL_TABLET | ORAL | 0 refills | Status: AC

## 2023-11-20 MED ORDER — IBUPROFEN 800 MG PO TABS: 800.0000 mg | ORAL_TABLET | Freq: Three times a day (TID) | ORAL | 0 refills | Status: AC | PRN

## 2023-11-20 NOTE — ED Provider Notes (Signed)
  EMERGENCY DEPARTMENT AT [facility] Provider Note   CSN: [id number] Arrival date & time: 11/20/23  9244     Patient presents with: Back Pain   Kevin Villa is a 68 y.o. male.  {Add pertinent medical, surgical, social history, OB history to YEP:67052} Patient complains of right flank pain.  He has a history of hypertension and elevated cholesterol   Back Pain      Prior to Admission medications   Medication Sig Start Date End Date Taking? Authorizing Provider  ibuprofen  (ADVIL ) 800 MG tablet Take 1 tablet (800 mg total) by mouth every 8 (eight) hours as needed for moderate pain (pain score 4-6). 11/20/23  Yes Suzette Pac, MD  oxyCODONE -acetaminophen  (PERCOCET/ROXICET) 5-325 MG tablet Take 1 every 6 hours for pain not helped by Tylenol  or Motrin  11/20/23  Yes Suzette Pac, MD  albuterol  (PROVENTIL ) (2.5 MG/3ML) 0.083% nebulizer solution Take 2.5 mg by nebulization 3 (three) times daily as needed.    [provider]  amLODipine  (NORVASC ) 5 MG tablet Take 5 mg by mouth daily. 04/09/23   [provider]  aspirin  EC 81 MG tablet Take 81 mg by mouth daily. Swallow whole.    [provider]  Azelastine HCl 137 MCG/SPRAY SOLN Place 2 sprays into both nostrils 2 (two) times daily. 08/15/23   [provider]  Cholecalciferol (VITAMIN D3) 50 MCG (2000 UT) CAPS Take 1 capsule by mouth daily.    [provider]  guaiFENesin -codeine  100-10 MG/5ML syrup Take 5 mLs by mouth 3 (three) times daily as needed for cough. 11/02/23   Yolande Lamar BROCKS, MD  losartan (COZAAR) 25 MG tablet Take 25 mg by mouth daily. 04/09/23   [provider]  ondansetron  (ZOFRAN -ODT) 4 MG disintegrating tablet Take 1 tablet (4 mg total) by mouth every 8 (eight) hours as needed for nausea or vomiting. 11/02/23   Yolande Lamar BROCKS, MD  oseltamivir  (TAMIFLU ) 75 MG capsule Take 1 capsule (75 mg total) by mouth every 12 (twelve) hours. 11/02/23    Yolande Lamar BROCKS, MD  pravastatin  (PRAVACHOL ) 20 MG tablet Take 1 tablet (20 mg total) by mouth every evening. 04/21/19   Elnor Lauraine BRAVO, NP  senna-docusate (SENOKOT-S) 8.6-50 MG tablet Take 1 tablets by mouth twice a day as needed for constipation 08/16/20   Elnor Lauraine BRAVO, NP  vitamin B-12 (CYANOCOBALAMIN) 500 MCG tablet Take 500 mcg by mouth daily.    [provider]    Allergies: Tomato, Peach flavoring agent (non-screening), and Peanut-containing drug products    Review of Systems  Musculoskeletal:  Positive for back pain.    Updated Vital Signs BP 135/87   Pulse 71   Temp 98.4 F (36.9 C) (Oral)   Resp 18   Ht 5' 11 (1.803 m)   Wt 106.6 kg   SpO2 92%   BMI 32.78 kg/m   Physical Exam  (all labs ordered are listed, but only abnormal results are displayed) Labs Reviewed  URINALYSIS, ROUTINE W REFLEX MICROSCOPIC - Abnormal; Notable for the following components:      Result Value   Hgb urine dipstick MODERATE (*)    Protein, ur 30 (*)    All other components within normal limits  COMPREHENSIVE METABOLIC PANEL WITH GFR - Abnormal; Notable for the following components:   Glucose, Bld 109 (*)    Total Protein 8.2 (*)    All other components within normal limits  CBC WITH DIFFERENTIAL/PLATELET    EKG: None  Radiology:  CT Renal Stone Study Result Date: 11/20/2023 CLINICAL DATA:  Right flank pain. EXAM: CT ABDOMEN AND PELVIS WITHOUT CONTRAST TECHNIQUE: Multidetector CT imaging of the abdomen and pelvis was performed following the standard protocol without IV contrast. RADIATION DOSE REDUCTION: This exam was performed according to the departmental dose-optimization program which includes automated exposure control, adjustment of the mA and/or kV according to patient size and/or use of iterative reconstruction technique. COMPARISON:  CT abdomen/pelvis dated 07/17/2016. FINDINGS: Lower chest: No acute abnormality. Hepatobiliary: No suspicious focal hepatic lesion  identified within the limits of an unenhanced exam. Gallbladder is unremarkable. No biliary dilatation. Pancreas: Unremarkable. No pancreatic ductal dilatation or surrounding inflammatory changes. Spleen: Normal in size without focal abnormality. Adrenals/Urinary Tract: Adrenal glands are unremarkable. No right-sided renal calculi or hydronephrosis. No definite right ureteral calculi, although evaluation of the distal ureter is somewhat limited by streak artifact from indwelling right hip orthopedic hardware. No left-sided urolithiasis or hydronephrosis. Bladder is minimally distended and otherwise grossly unremarkable. Stomach/Bowel: Stomach is within normal limits. Appendix appears normal. No evidence of bowel wall thickening, distention, or inflammatory changes. Moderate-to-large volume of stool in the transverse, descending, and sigmoid colon. Vascular/Lymphatic: Abdominal aorta is normal in caliber with atherosclerotic calcification. No enlarged abdominal or pelvic lymph nodes. Reproductive: Prostate is unremarkable. Other: No abdominal wall hernia or abnormality. No abdominopelvic ascites. No intraperitoneal free air. Musculoskeletal: Intact right total hip arthroplasty. No acute osseous abnormality. No suspicious osseous lesion. IMPRESSION: 1. No acute localizing findings in the abdomen or pelvis. Specifically, no evidence of urolithiasis or hydronephrosis. 2. Moderate-to-large volume of stool in the transverse and left colon. No evidence of obstruction. 3.  Aortic Atherosclerosis (ICD10-I70.0). Electronically Signed   By: Harrietta Sherry M.D.   On: 11/20/2023 10:07    {Document cardiac monitor, telemetry assessment procedure when appropriate:32947} Procedures   Medications Ordered in the ED  HYDROmorphone  (DILAUDID ) injection 0.5 mg (0.5 mg Intravenous Given 11/20/23 0847)  ondansetron  (ZOFRAN ) injection 4 mg (4 mg Intravenous Given 11/20/23 0847)      {Click here for ABCD2, HEART and other  calculators REFRESH Note before signing:1}                              Medical Decision Making Amount and/or Complexity of Data Reviewed Labs: ordered. Radiology: ordered.  Risk Prescription drug management.   Patient with lumbar strain.  He will be sent home with Motrin  and Percocets and will follow-up with PCP  {Document critical care time when appropriate  Document review of labs and clinical decision tools ie CHADS2VASC2, etc  Document your independent review of radiology images and any outside records  Document your discussion with family members, caretakers and with consultants  Document social determinants of health affecting pt's care  Document your decision making why or why not admission, treatments were needed:32947:::1}   Final diagnoses:  Strain of lumbar region, initial encounter    ED Discharge Orders          Ordered    ibuprofen  (ADVIL ) 800 MG tablet  Every 8 hours PRN        11/20/23 1023    oxyCODONE -acetaminophen  (PERCOCET/ROXICET) 5-325 MG tablet        11/20/23 1023

## 2023-11-20 NOTE — ED Triage Notes (Signed)
 Pt brb family; c/o rt flank pain, sudden onset yesterday, denies injury or activity, denies hematuria; no hx of kidney stone, hx of hernia. A&Ox4; pain reported 10/10. Denies N/V/D.

## 2023-11-20 NOTE — Discharge Instructions (Signed)
 Rest at home for 2 to 3 days.  No lifting over 10 pounds.  Follow-up with your family doctor next week for recheck

## 2023-11-21 DIAGNOSIS — Z09 Encounter for follow-up examination after completed treatment for conditions other than malignant neoplasm: Secondary | ICD-10-CM | POA: Diagnosis not present

## 2023-11-21 DIAGNOSIS — S39012A Strain of muscle, fascia and tendon of lower back, initial encounter: Secondary | ICD-10-CM | POA: Diagnosis not present

## 2023-12-26 ENCOUNTER — Telehealth: Payer: Self-pay

## 2023-12-26 NOTE — Telephone Encounter (Signed)
 Up to date on meds, outreach again in a week

## 2024-01-01 ENCOUNTER — Telehealth: Payer: Self-pay

## 2024-01-01 NOTE — Telephone Encounter (Signed)
 Unsuccessful outreach, will try again next week

## 2024-02-11 DIAGNOSIS — E559 Vitamin D deficiency, unspecified: Secondary | ICD-10-CM | POA: Diagnosis not present

## 2024-02-11 DIAGNOSIS — D509 Iron deficiency anemia, unspecified: Secondary | ICD-10-CM | POA: Diagnosis not present

## 2024-02-11 DIAGNOSIS — R7303 Prediabetes: Secondary | ICD-10-CM | POA: Diagnosis not present

## 2024-02-11 DIAGNOSIS — E782 Mixed hyperlipidemia: Secondary | ICD-10-CM | POA: Diagnosis not present

## 2024-02-15 DIAGNOSIS — E782 Mixed hyperlipidemia: Secondary | ICD-10-CM | POA: Diagnosis not present

## 2024-02-15 DIAGNOSIS — M5412 Radiculopathy, cervical region: Secondary | ICD-10-CM | POA: Diagnosis not present

## 2024-02-15 DIAGNOSIS — K5900 Constipation, unspecified: Secondary | ICD-10-CM | POA: Diagnosis not present

## 2024-02-15 DIAGNOSIS — I10 Essential (primary) hypertension: Secondary | ICD-10-CM | POA: Diagnosis not present

## 2024-02-15 DIAGNOSIS — R300 Dysuria: Secondary | ICD-10-CM | POA: Diagnosis not present

## 2024-02-15 DIAGNOSIS — D509 Iron deficiency anemia, unspecified: Secondary | ICD-10-CM | POA: Diagnosis not present

## 2024-02-15 DIAGNOSIS — R7303 Prediabetes: Secondary | ICD-10-CM | POA: Diagnosis not present

## 2024-02-15 DIAGNOSIS — R079 Chest pain, unspecified: Secondary | ICD-10-CM | POA: Diagnosis not present

## 2024-02-15 DIAGNOSIS — J302 Other seasonal allergic rhinitis: Secondary | ICD-10-CM | POA: Diagnosis not present

## 2024-02-15 DIAGNOSIS — E559 Vitamin D deficiency, unspecified: Secondary | ICD-10-CM | POA: Diagnosis not present
